# Patient Record
Sex: Male | Born: 1940
Health system: Southern US, Community
[De-identification: ages and names within clinical notes are randomized; demographics above are authoritative.]

## PROBLEM LIST (undated history)

## (undated) DIAGNOSIS — E790 Hyperuricemia without signs of inflammatory arthritis and tophaceous disease: Secondary | ICD-10-CM

## (undated) DIAGNOSIS — Z860100 Personal history of colon polyps, unspecified: Secondary | ICD-10-CM

## (undated) DIAGNOSIS — I499 Cardiac arrhythmia, unspecified: Secondary | ICD-10-CM

## (undated) DIAGNOSIS — I2510 Atherosclerotic heart disease of native coronary artery without angina pectoris: Secondary | ICD-10-CM

## (undated) DIAGNOSIS — C801 Malignant (primary) neoplasm, unspecified: Secondary | ICD-10-CM

## (undated) DIAGNOSIS — N400 Enlarged prostate without lower urinary tract symptoms: Secondary | ICD-10-CM

## (undated) DIAGNOSIS — M5430 Sciatica, unspecified side: Secondary | ICD-10-CM

## (undated) DIAGNOSIS — M4800 Spinal stenosis, site unspecified: Secondary | ICD-10-CM

## (undated) DIAGNOSIS — I82409 Acute embolism and thrombosis of unspecified deep veins of unspecified lower extremity: Secondary | ICD-10-CM

## (undated) DIAGNOSIS — I10 Essential (primary) hypertension: Secondary | ICD-10-CM

## (undated) DIAGNOSIS — R413 Other amnesia: Secondary | ICD-10-CM

## (undated) DIAGNOSIS — E785 Hyperlipidemia, unspecified: Secondary | ICD-10-CM

## (undated) DIAGNOSIS — F32A Depression, unspecified: Secondary | ICD-10-CM

## (undated) DIAGNOSIS — J302 Other seasonal allergic rhinitis: Secondary | ICD-10-CM

## (undated) DIAGNOSIS — F419 Anxiety disorder, unspecified: Secondary | ICD-10-CM

## (undated) DIAGNOSIS — K449 Diaphragmatic hernia without obstruction or gangrene: Secondary | ICD-10-CM

## (undated) DIAGNOSIS — K219 Gastro-esophageal reflux disease without esophagitis: Secondary | ICD-10-CM

## (undated) DIAGNOSIS — G4700 Insomnia, unspecified: Secondary | ICD-10-CM

## (undated) HISTORY — DX: Spinal stenosis, site unspecified: M48.00

## (undated) HISTORY — DX: Personal history of colonic polyps: Z86.010

## (undated) HISTORY — DX: Malignant (primary) neoplasm, unspecified: C80.1

## (undated) HISTORY — DX: Hyperlipidemia, unspecified: E78.5

## (undated) HISTORY — DX: Personal history of colon polyps, unspecified: Z86.0100

## (undated) HISTORY — DX: Sciatica, unspecified side: M54.30

## (undated) HISTORY — PX: VASECTOMY: SHX75

## (undated) HISTORY — DX: Diaphragmatic hernia without obstruction or gangrene: K44.9

## (undated) HISTORY — PX: OTHER SURGICAL HISTORY: SHX169

## (undated) HISTORY — DX: Hyperuricemia without signs of inflammatory arthritis and tophaceous disease: E79.0

---
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
## 1966-05-01 HISTORY — PX: INGUINAL HERNIA REPAIR: SHX194

## 1984-05-01 HISTORY — PX: HEMORRHOID SURGERY: SHX153

## 2000-01-09 ENCOUNTER — Encounter: Payer: Self-pay | Admitting: *Deleted

## 2000-01-09 ENCOUNTER — Encounter: Admission: RE | Admit: 2000-01-09 | Discharge: 2000-01-09 | Payer: Self-pay | Admitting: *Deleted

## 2003-03-05 ENCOUNTER — Encounter: Payer: Self-pay | Admitting: Internal Medicine

## 2004-03-09 ENCOUNTER — Ambulatory Visit: Payer: Self-pay | Admitting: Internal Medicine

## 2004-04-22 ENCOUNTER — Encounter: Admission: RE | Admit: 2004-04-22 | Discharge: 2004-04-22 | Payer: Self-pay | Admitting: Internal Medicine

## 2005-03-14 ENCOUNTER — Ambulatory Visit: Payer: Self-pay | Admitting: Internal Medicine

## 2005-06-27 ENCOUNTER — Ambulatory Visit: Payer: Self-pay | Admitting: Internal Medicine

## 2006-03-19 ENCOUNTER — Ambulatory Visit: Payer: Self-pay | Admitting: Internal Medicine

## 2006-05-01 HISTORY — PX: UPPER GASTROINTESTINAL ENDOSCOPY: SHX188

## 2006-06-26 ENCOUNTER — Ambulatory Visit (HOSPITAL_BASED_OUTPATIENT_CLINIC_OR_DEPARTMENT_OTHER): Admission: RE | Admit: 2006-06-26 | Discharge: 2006-06-26 | Payer: Self-pay | Admitting: Orthopaedic Surgery

## 2006-08-31 ENCOUNTER — Ambulatory Visit: Payer: Self-pay | Admitting: Internal Medicine

## 2007-04-02 ENCOUNTER — Telehealth (INDEPENDENT_AMBULATORY_CARE_PROVIDER_SITE_OTHER): Payer: Self-pay | Admitting: *Deleted

## 2007-04-26 ENCOUNTER — Ambulatory Visit: Payer: Self-pay | Admitting: Internal Medicine

## 2007-04-26 DIAGNOSIS — Z9089 Acquired absence of other organs: Secondary | ICD-10-CM | POA: Insufficient documentation

## 2007-04-26 DIAGNOSIS — I4510 Unspecified right bundle-branch block: Secondary | ICD-10-CM | POA: Insufficient documentation

## 2007-04-26 DIAGNOSIS — E785 Hyperlipidemia, unspecified: Secondary | ICD-10-CM | POA: Insufficient documentation

## 2007-05-03 ENCOUNTER — Encounter (INDEPENDENT_AMBULATORY_CARE_PROVIDER_SITE_OTHER): Payer: Self-pay | Admitting: *Deleted

## 2007-05-08 ENCOUNTER — Encounter (INDEPENDENT_AMBULATORY_CARE_PROVIDER_SITE_OTHER): Payer: Self-pay | Admitting: *Deleted

## 2007-07-18 ENCOUNTER — Emergency Department (HOSPITAL_COMMUNITY): Admission: EM | Admit: 2007-07-18 | Discharge: 2007-07-18 | Payer: Self-pay | Admitting: Emergency Medicine

## 2007-07-18 ENCOUNTER — Telehealth: Payer: Self-pay | Admitting: Internal Medicine

## 2007-07-19 ENCOUNTER — Ambulatory Visit: Payer: Self-pay | Admitting: Internal Medicine

## 2007-07-19 DIAGNOSIS — M48061 Spinal stenosis, lumbar region without neurogenic claudication: Secondary | ICD-10-CM | POA: Insufficient documentation

## 2007-10-10 ENCOUNTER — Telehealth (INDEPENDENT_AMBULATORY_CARE_PROVIDER_SITE_OTHER): Payer: Self-pay | Admitting: *Deleted

## 2007-10-21 ENCOUNTER — Ambulatory Visit: Payer: Self-pay | Admitting: Internal Medicine

## 2007-10-31 ENCOUNTER — Encounter (INDEPENDENT_AMBULATORY_CARE_PROVIDER_SITE_OTHER): Payer: Self-pay | Admitting: *Deleted

## 2007-10-31 LAB — CONVERTED CEMR LAB
ALT: 34 units/L (ref 0–53)
AST: 29 units/L (ref 0–37)
Albumin: 4.2 g/dL (ref 3.5–5.2)
Alkaline Phosphatase: 44 units/L (ref 39–117)
Bilirubin, Direct: 0.1 mg/dL (ref 0.0–0.3)
Cholesterol: 193 mg/dL (ref 0–200)
HDL: 36.2 mg/dL — ABNORMAL LOW (ref >39.0)
LDL Cholesterol: 127 mg/dL — ABNORMAL HIGH (ref 0–99)
Total Bilirubin: 1.3 mg/dL — ABNORMAL HIGH (ref 0.3–1.2)
Total CHOL/HDL Ratio: 5.3
Total Protein: 6.7 g/dL (ref 6.0–8.3)
Triglycerides: 148 mg/dL (ref 0–149)
VLDL: 30 mg/dL (ref 0–40)

## 2008-02-10 ENCOUNTER — Ambulatory Visit: Payer: Self-pay | Admitting: Internal Medicine

## 2008-05-29 ENCOUNTER — Encounter: Payer: Self-pay | Admitting: Internal Medicine

## 2008-06-24 ENCOUNTER — Ambulatory Visit: Payer: Self-pay | Admitting: Internal Medicine

## 2008-06-24 LAB — CONVERTED CEMR LAB
OCCULT 1: NEGATIVE
OCCULT 2: NEGATIVE
OCCULT 3: NEGATIVE

## 2008-06-25 ENCOUNTER — Encounter (INDEPENDENT_AMBULATORY_CARE_PROVIDER_SITE_OTHER): Payer: Self-pay | Admitting: *Deleted

## 2008-08-28 ENCOUNTER — Ambulatory Visit: Payer: Self-pay | Admitting: Internal Medicine

## 2008-08-28 DIAGNOSIS — M2550 Pain in unspecified joint: Secondary | ICD-10-CM | POA: Insufficient documentation

## 2008-08-28 DIAGNOSIS — N400 Enlarged prostate without lower urinary tract symptoms: Secondary | ICD-10-CM | POA: Insufficient documentation

## 2008-08-28 DIAGNOSIS — Z86010 Personal history of colonic polyps: Secondary | ICD-10-CM | POA: Insufficient documentation

## 2008-08-28 DIAGNOSIS — Z85828 Personal history of other malignant neoplasm of skin: Secondary | ICD-10-CM | POA: Insufficient documentation

## 2008-09-01 ENCOUNTER — Encounter (INDEPENDENT_AMBULATORY_CARE_PROVIDER_SITE_OTHER): Payer: Self-pay | Admitting: *Deleted

## 2009-02-16 ENCOUNTER — Encounter: Payer: Self-pay | Admitting: Internal Medicine

## 2009-04-22 ENCOUNTER — Ambulatory Visit: Payer: Self-pay | Admitting: Internal Medicine

## 2009-09-03 ENCOUNTER — Ambulatory Visit: Payer: Self-pay | Admitting: Internal Medicine

## 2009-09-03 DIAGNOSIS — M2550 Pain in unspecified joint: Secondary | ICD-10-CM | POA: Insufficient documentation

## 2009-09-03 DIAGNOSIS — R9431 Abnormal electrocardiogram [ECG] [EKG]: Secondary | ICD-10-CM | POA: Insufficient documentation

## 2009-11-02 ENCOUNTER — Encounter: Payer: Self-pay | Admitting: Internal Medicine

## 2009-11-03 ENCOUNTER — Encounter: Payer: Self-pay | Admitting: Internal Medicine

## 2009-11-23 ENCOUNTER — Ambulatory Visit: Payer: Self-pay | Admitting: Internal Medicine

## 2009-11-23 LAB — CONVERTED CEMR LAB
OCCULT 1: NEGATIVE
OCCULT 2: NEGATIVE
OCCULT 3: NEGATIVE

## 2009-11-24 ENCOUNTER — Encounter (INDEPENDENT_AMBULATORY_CARE_PROVIDER_SITE_OTHER): Payer: Self-pay | Admitting: *Deleted

## 2009-12-07 ENCOUNTER — Telehealth (INDEPENDENT_AMBULATORY_CARE_PROVIDER_SITE_OTHER): Payer: Self-pay | Admitting: *Deleted

## 2010-01-27 ENCOUNTER — Encounter: Payer: Self-pay | Admitting: Internal Medicine

## 2010-02-13 ENCOUNTER — Encounter: Payer: Self-pay | Admitting: Internal Medicine

## 2010-03-14 ENCOUNTER — Encounter: Payer: Self-pay | Admitting: Internal Medicine

## 2010-03-15 ENCOUNTER — Telehealth (INDEPENDENT_AMBULATORY_CARE_PROVIDER_SITE_OTHER): Payer: Self-pay | Admitting: *Deleted

## 2010-03-22 ENCOUNTER — Ambulatory Visit: Payer: Self-pay | Admitting: Internal Medicine

## 2010-03-25 LAB — CONVERTED CEMR LAB
ALT: 28 units/L (ref 0–53)
AST: 29 units/L (ref 0–37)
Albumin: 4.3 g/dL (ref 3.5–5.2)
Alkaline Phosphatase: 45 units/L (ref 39–117)
BUN: 19 mg/dL (ref 6–23)
Basophils Absolute: 0 10*3/uL (ref 0.0–0.1)
Basophils Relative: 0.5 % (ref 0.0–3.0)
Bilirubin, Direct: 0.1 mg/dL (ref 0.0–0.3)
CO2: 29 meq/L (ref 19–32)
Calcium: 9.2 mg/dL (ref 8.4–10.5)
Chloride: 105 meq/L (ref 96–112)
Creatinine, Ser: 1.1 mg/dL (ref 0.4–1.5)
Eosinophils Absolute: 0.1 10*3/uL (ref 0.0–0.7)
Eosinophils Relative: 1.5 % (ref 0.0–5.0)
GFR calc non Af Amer: 70.04 mL/min (ref >60)
Glucose, Bld: 100 mg/dL — ABNORMAL HIGH (ref 70–99)
HCT: 42.7 % (ref 39.0–52.0)
Hemoglobin: 14.6 g/dL (ref 13.0–17.0)
Lymphocytes Relative: 13.7 % (ref 12.0–46.0)
Lymphs Abs: 1.1 10*3/uL (ref 0.7–4.0)
MCHC: 34.2 g/dL (ref 30.0–36.0)
MCV: 98.3 fL (ref 78.0–100.0)
Monocytes Absolute: 0.4 10*3/uL (ref 0.1–1.0)
Monocytes Relative: 4.9 % (ref 3.0–12.0)
Neutro Abs: 6.3 10*3/uL (ref 1.4–7.7)
Neutrophils Relative %: 79.4 % — ABNORMAL HIGH (ref 43.0–77.0)
Platelets: 169 10*3/uL (ref 150.0–400.0)
Potassium: 4.8 meq/L (ref 3.5–5.1)
RBC: 4.34 M/uL (ref 4.22–5.81)
RDW: 13.5 % (ref 11.5–14.6)
Sed Rate: 9 mm/hr (ref 0–22)
Sodium: 143 meq/L (ref 135–145)
TSH: 1.43 microintl units/mL (ref 0.35–5.50)
Total Bilirubin: 0.8 mg/dL (ref 0.3–1.2)
Total Protein: 6.6 g/dL (ref 6.0–8.3)
WBC: 7.9 10*3/uL (ref 4.5–10.5)

## 2010-05-01 DIAGNOSIS — E790 Hyperuricemia without signs of inflammatory arthritis and tophaceous disease: Secondary | ICD-10-CM

## 2010-05-01 HISTORY — DX: Hyperuricemia without signs of inflammatory arthritis and tophaceous disease: E79.0

## 2010-05-29 LAB — CONVERTED CEMR LAB
ALT: 26 units/L (ref 0–53)
ALT: 27 units/L (ref 0–40)
ALT: 28 units/L (ref 0–53)
ALT: 29 units/L (ref 0–53)
AST: 25 units/L (ref 0–37)
AST: 25 units/L (ref 0–37)
AST: 27 units/L (ref 0–37)
AST: 27 units/L (ref 0–37)
Albumin: 4.1 g/dL (ref 3.5–5.2)
Albumin: 4.1 g/dL (ref 3.5–5.2)
Albumin: 4.2 g/dL (ref 3.5–5.2)
Albumin: 4.4 g/dL (ref 3.5–5.2)
Alkaline Phosphatase: 43 units/L (ref 39–117)
Alkaline Phosphatase: 47 units/L (ref 39–117)
Alkaline Phosphatase: 48 units/L (ref 39–117)
Alkaline Phosphatase: 48 units/L (ref 39–117)
BUN: 12 mg/dL (ref 6–23)
BUN: 12 mg/dL (ref 6–23)
BUN: 16 mg/dL (ref 6–23)
BUN: 18 mg/dL (ref 6–23)
Basophils Absolute: 0 10*3/uL (ref 0.0–0.1)
Basophils Absolute: 0 10*3/uL (ref 0.0–0.1)
Basophils Absolute: 0 10*3/uL (ref 0.0–0.1)
Basophils Absolute: 0 10*3/uL (ref 0.0–0.1)
Basophils Relative: 0.1 % (ref 0.0–1.0)
Basophils Relative: 0.1 % (ref 0.0–1.0)
Basophils Relative: 0.4 % (ref 0.0–3.0)
Basophils Relative: 0.5 % (ref 0.0–3.0)
Bilirubin Urine: NEGATIVE
Bilirubin, Direct: 0.1 mg/dL (ref 0.0–0.3)
Bilirubin, Direct: 0.2 mg/dL (ref 0.0–0.3)
Bilirubin, Direct: 0.2 mg/dL (ref 0.0–0.3)
Blood in Urine, dipstick: NEGATIVE
CO2: 29 meq/L (ref 19–32)
CO2: 30 meq/L (ref 19–32)
CO2: 30 meq/L (ref 19–32)
CO2: 32 meq/L (ref 19–32)
Calcium: 9.1 mg/dL (ref 8.4–10.5)
Calcium: 9.2 mg/dL (ref 8.4–10.5)
Calcium: 9.2 mg/dL (ref 8.4–10.5)
Calcium: 9.6 mg/dL (ref 8.4–10.5)
Chloride: 103 meq/L (ref 96–112)
Chloride: 104 meq/L (ref 96–112)
Chloride: 105 meq/L (ref 96–112)
Chloride: 109 meq/L (ref 96–112)
Chol/HDL Ratio, serum: 4.2
Cholesterol, target level: 200 mg/dL
Cholesterol, target level: 200 mg/dL
Cholesterol: 166 mg/dL (ref 0–200)
Cholesterol: 166 mg/dL (ref 0–200)
Cholesterol: 169 mg/dL (ref 0–200)
Cholesterol: 173 mg/dL (ref 0–200)
Creatinine, Ser: 0.9 mg/dL (ref 0.4–1.5)
Creatinine, Ser: 0.9 mg/dL (ref 0.4–1.5)
Creatinine, Ser: 1 mg/dL (ref 0.4–1.5)
Creatinine, Ser: 1 mg/dL (ref 0.4–1.5)
Eosinophil percent: 2.6 % (ref 0.0–5.0)
Eosinophils Absolute: 0.2 10*3/uL (ref 0.0–0.6)
Eosinophils Absolute: 0.2 10*3/uL (ref 0.0–0.7)
Eosinophils Absolute: 0.4 10*3/uL (ref 0.0–0.7)
Eosinophils Relative: 3 % (ref 0.0–5.0)
Eosinophils Relative: 3.8 % (ref 0.0–5.0)
Eosinophils Relative: 5.2 % — ABNORMAL HIGH (ref 0.0–5.0)
GFR calc Af Amer: 109 mL/min
GFR calc non Af Amer: 80 mL/min
GFR calc non Af Amer: 81.97 mL/min (ref >60)
GFR calc non Af Amer: 89.65 mL/min (ref >60)
GFR calc non Af Amer: 90 mL/min
Glomerular Filtration Rate, Af Am: 97 mL/min/{1.73_m2}
Glucose, Bld: 103 mg/dL — ABNORMAL HIGH (ref 70–99)
Glucose, Bld: 114 mg/dL — ABNORMAL HIGH (ref 70–99)
Glucose, Bld: 93 mg/dL (ref 70–99)
Glucose, Bld: 93 mg/dL (ref 70–99)
Glucose, Urine, Semiquant: NEGATIVE
HCT: 42.4 % (ref 39.0–52.0)
HCT: 42.8 % (ref 39.0–52.0)
HCT: 43 % (ref 39.0–52.0)
HCT: 43.5 % (ref 39.0–52.0)
HDL goal, serum: 40 mg/dL
HDL goal, serum: 40 mg/dL
HDL: 34.1 mg/dL — ABNORMAL LOW (ref >39.0)
HDL: 37 mg/dL — ABNORMAL LOW (ref >39.00)
HDL: 40.1 mg/dL (ref >39.0)
HDL: 44.8 mg/dL (ref >39.00)
Hemoglobin: 14.6 g/dL (ref 13.0–17.0)
Hemoglobin: 14.7 g/dL (ref 13.0–17.0)
Hemoglobin: 14.7 g/dL (ref 13.0–17.0)
Hemoglobin: 14.8 g/dL (ref 13.0–17.0)
Hgb A1c MFr Bld: 5.4 % (ref 4.6–6.5)
Ketones, urine, test strip: NEGATIVE
LDL Cholesterol: 100 mg/dL — ABNORMAL HIGH (ref 0–99)
LDL Cholesterol: 109 mg/dL — ABNORMAL HIGH (ref 0–99)
LDL Cholesterol: 111 mg/dL — ABNORMAL HIGH (ref 0–99)
LDL Cholesterol: 92 mg/dL (ref 0–99)
LDL Goal: 100 mg/dL
LDL Goal: 160 mg/dL
LDL Goal: 90 mg/dL
Lymphocytes Relative: 18.3 % (ref 12.0–46.0)
Lymphocytes Relative: 18.6 % (ref 12.0–46.0)
Lymphocytes Relative: 20.2 % (ref 12.0–46.0)
Lymphocytes Relative: 23.1 % (ref 12.0–46.0)
Lymphs Abs: 1.2 10*3/uL (ref 0.7–4.0)
Lymphs Abs: 1.3 10*3/uL (ref 0.7–4.0)
MCHC: 33.6 g/dL (ref 30.0–36.0)
MCHC: 34.3 g/dL (ref 30.0–36.0)
MCHC: 34.5 g/dL (ref 30.0–36.0)
MCHC: 34.7 g/dL (ref 30.0–36.0)
MCV: 96.1 fL (ref 78.0–100.0)
MCV: 97.1 fL (ref 78.0–100.0)
MCV: 97.4 fL (ref 78.0–100.0)
MCV: 97.8 fL (ref 78.0–100.0)
Monocytes Absolute: 0.3 10*3/uL (ref 0.1–1.0)
Monocytes Absolute: 0.3 10*3/uL (ref 0.2–0.7)
Monocytes Absolute: 0.3 10*3/uL (ref 0.2–0.7)
Monocytes Absolute: 0.4 10*3/uL (ref 0.1–1.0)
Monocytes Relative: 5 % (ref 3.0–12.0)
Monocytes Relative: 5.1 % (ref 3.0–11.0)
Monocytes Relative: 5.8 % (ref 3.0–12.0)
Monocytes Relative: 6.1 % (ref 3.0–11.0)
Neutro Abs: 3.5 10*3/uL (ref 1.4–7.7)
Neutro Abs: 4.1 10*3/uL (ref 1.4–7.7)
Neutro Abs: 4.8 10*3/uL (ref 1.4–7.7)
Neutro Abs: 5 10*3/uL (ref 1.4–7.7)
Neutrophils Relative %: 66.8 % (ref 43.0–77.0)
Neutrophils Relative %: 70.8 % (ref 43.0–77.0)
Neutrophils Relative %: 71 % (ref 43.0–77.0)
Neutrophils Relative %: 73.5 % (ref 43.0–77.0)
Nitrite: NEGATIVE
PSA: 0.89 ng/mL (ref 0.10–4.00)
PSA: 1.08 ng/mL (ref 0.10–4.00)
PSA: 1.09 ng/mL (ref 0.10–4.00)
PSA: 1.18 ng/mL (ref 0.10–4.00)
Platelets: 146 10*3/uL — ABNORMAL LOW (ref 150.0–400.0)
Platelets: 158 10*3/uL (ref 150–400)
Platelets: 161 10*3/uL (ref 150–400)
Platelets: 172 10*3/uL (ref 150.0–400.0)
Potassium: 4.3 meq/L (ref 3.5–5.1)
Potassium: 4.4 meq/L (ref 3.5–5.1)
Potassium: 4.6 meq/L (ref 3.5–5.1)
Potassium: 4.9 meq/L (ref 3.5–5.1)
Protein, U semiquant: NEGATIVE
RBC: 4.37 M/uL (ref 4.22–5.81)
RBC: 4.42 M/uL (ref 4.22–5.81)
RBC: 4.44 M/uL (ref 4.22–5.81)
RBC: 4.45 M/uL (ref 4.22–5.81)
RDW: 11.9 % (ref 11.5–14.6)
RDW: 12 % (ref 11.5–14.6)
RDW: 12 % (ref 11.5–14.6)
RDW: 13.1 % (ref 11.5–14.6)
Rheumatoid fact SerPl-aCnc: 20 intl units/mL (ref 0.0–20.0)
Sed Rate: 8 mm/hr (ref 0–22)
Sodium: 141 meq/L (ref 135–145)
Sodium: 142 meq/L (ref 135–145)
Sodium: 142 meq/L (ref 135–145)
Sodium: 145 meq/L (ref 135–145)
Specific Gravity, Urine: >=1.03
TSH: 1.08 microintl units/mL (ref 0.35–5.50)
TSH: 1.14 microintl units/mL (ref 0.35–5.50)
TSH: 1.15 microintl units/mL (ref 0.35–5.50)
TSH: 1.77 microintl units/mL (ref 0.35–5.50)
Total Bilirubin: 0.8 mg/dL (ref 0.3–1.2)
Total Bilirubin: 1.3 mg/dL — ABNORMAL HIGH (ref 0.3–1.2)
Total Bilirubin: 1.4 mg/dL — ABNORMAL HIGH (ref 0.3–1.2)
Total Bilirubin: 1.5 mg/dL — ABNORMAL HIGH (ref 0.3–1.2)
Total CHOL/HDL Ratio: 4
Total CHOL/HDL Ratio: 4.9
Total CHOL/HDL Ratio: 5
Total Protein: 6.4 g/dL (ref 6.0–8.3)
Total Protein: 6.9 g/dL (ref 6.0–8.3)
Total Protein: 7 g/dL (ref 6.0–8.3)
Total Protein: 7.1 g/dL (ref 6.0–8.3)
Triglyceride fasting, serum: 98 mg/dL (ref 0–149)
Triglycerides: 106 mg/dL (ref 0.0–149.0)
Triglycerides: 124 mg/dL (ref 0.0–149.0)
Triglycerides: 197 mg/dL — ABNORMAL HIGH (ref 0–149)
Uric Acid, Serum: 7.1 mg/dL (ref 4.0–7.8)
Urobilinogen, UA: NEGATIVE
VLDL: 20 mg/dL (ref 0–40)
VLDL: 21.2 mg/dL (ref 0.0–40.0)
VLDL: 24.8 mg/dL (ref 0.0–40.0)
VLDL: 39 mg/dL (ref 0–40)
Vit D, 25-Hydroxy: 44 ng/mL (ref 30–89)
WBC Urine, dipstick: NEGATIVE
WBC: 5.2 10*3/uL (ref 4.5–10.5)
WBC: 5.7 10*3/uL (ref 4.5–10.5)
WBC: 6.5 10*3/uL (ref 4.5–10.5)
WBC: 7.1 10*3/uL (ref 4.5–10.5)
pH: 5

## 2010-06-02 NOTE — Letter (Signed)
Summary: E Mail from Patient with Concerns  E Mail from Patient with Concerns   Imported By: Lanelle Bal 02/19/2010 09:03:32  _____________________________________________________________________  External Attachment:    Type:   Image     Comment:   External Document

## 2010-06-02 NOTE — Letter (Signed)
Summary: Results Follow up Letter  [place] [facility]  [address] [place], [place] [address]   Phone: [phone]  Fax: [phone]    06/25/2008 MRN: [id number]  James Noble [address] [place], [place]  [address]  Dear James Noble,  The following are the results of your recent test(s):  Test         Result    Pap Smear:        Normal _____  Not Normal _____ Comments: ______________________________________________________ Cholesterol: LDL(Bad cholesterol):         Your goal is less than:         HDL (Good cholesterol):       Your goal is more than: Comments:  ______________________________________________________ Mammogram:        Normal _____  Not Normal _____ Comments:  ___________________________________________________________________ Hemoccult:        Normal _X____  Not normal _______ Comments:    _____________________________________________________________________ Other Tests:    We routinely do not discuss normal results over the telephone.  If you desire a copy of the results, or you have any questions about this information we can discuss them at your next office visit.   Sincerely,

## 2010-06-02 NOTE — Letter (Signed)
Summary: Primary Care Appointment Letter  [place] [facility]  [address] [place], [place] [address]   Phone: [phone]  Fax: [phone]    05/08/2007 MRN: [id number]  James Noble [address] [place], [place]  [address]  Dear James Noble,   Your Primary Care Physician  has indicated that:    _______[place] call our office & schedule a lab appointment 10-11 weeks of diet changes & starting crestor.      _______you missed your appointment on______ and need to call and          reschedule.    _______you need to have lab work done.    _______you need to schedule an appointment discuss lab or test results.    _______you need to call to reschedule your appointment that is                       scheduled on _________.     Please call our office as soon as possible. Our phone number is [phone]. Please press option 1. Our office is open 8a-12noon and 1p-5p, Monday through Friday.    Thank you,    [facility] Scheduler

## 2010-06-02 NOTE — Progress Notes (Signed)
Summary: HOPPER-VYTORIN 10-20 MG #30  Phone Note Refill Request   Refills Requested: Medication #1:  VYTORIN 10-20 MG  TABS.  Varney Baas [id number]  Initial call taken by: Freddy Jaksch,  April 02, 2007 2:32 PM  Follow-up for Phone Call        Rx called to pharmacy Follow-up by: Wendall Stade,  April 02, 2007 3:31 PM      Prescriptions: VYTORIN 10-20 MG  TABS (EZETIMIBE-SIMVASTATIN)   #30 x 1   Entered by:   Wendall Stade   Authorized by:   Marga Melnick MD   Signed by:   Wendall Stade on 04/02/2007   Method used:   Telephoned to ...       [facility]       [address]       [place], [place]  [address]  [place]       Ph: [phone] or [phone]       Fax: [phone]   RxID:   [id number]

## 2010-06-02 NOTE — Letter (Signed)
Summary: Results Follow up Letter  [place] [facility]  [address] [place], [place] [address]   Phone: [phone]  Fax: [phone]    11/24/2009 MRN: [id number]  GRANVIL DJORDJEVIC [address] [place], [place]  [address]  Dear Mr. Zuelke,  The following are the results of your recent test(s):  Test         Result    Pap Smear:        Normal _____  Not Normal _____ Comments: ______________________________________________________ Cholesterol: LDL(Bad cholesterol):         Your goal is less than:         HDL (Good cholesterol):       Your goal is more than: Comments:  ______________________________________________________ Mammogram:        Normal _____  Not Normal _____ Comments:  ___________________________________________________________________ Hemoccult:        Normal _X___  Not normal _______ Comments:    _____________________________________________________________________ Other Tests:    We routinely do not discuss normal results over the telephone.  If you desire a copy of the results, or you have any questions about this information we can discuss them at your next office visit.   Sincerely,

## 2010-06-02 NOTE — Letter (Signed)
Summary: Results Follow up Letter  [place] at [facility]  [address] [place], [place] [address]   Phone: [phone]  Fax: [phone]    09/01/2008 MRN: [id number]  HOY FALLERT [address] [place], [place]  [address]  Dear Mr. Pandey,  The following are the results of your recent test(s):  Test         Result    Pap Smear:        Normal _____  Not Normal _____ Comments: ______________________________________________________ Cholesterol: LDL(Bad cholesterol):         Your goal is less than:         HDL (Good cholesterol):       Your goal is more than: Comments:  ______________________________________________________ Mammogram:        Normal _____  Not Normal _____ Comments:  ___________________________________________________________________ Hemoccult:        Normal _____  Not normal _______ Comments:    _____________________________________________________________________ Other Tests: PLEASE SEE ATTACHED LABS DONE ON 08/28/2008    We routinely do not discuss normal results over the telephone.  If you desire a copy of the results, or you have any questions about this information we can discuss them at your next office visit.   Sincerely,

## 2010-06-02 NOTE — Letter (Signed)
Summary: Results Follow up Letter  [place] [facility]  [address] [place], [place] [address]   Phone: [phone]  Fax: [phone]    10/31/2007 MRN: [id number]  PEREZ DIRICO [address] [place], [place]  [address]  Dear Mr. Cleland,  The following are the results of your recent test(s):  Test         Result    Pap Smear:        Normal _____  Not Normal _____ Comments: ______________________________________________________ Cholesterol: LDL(Bad cholesterol):         Your goal is less than:         HDL (Good cholesterol):       Your goal is more than: Comments:  ______________________________________________________ Mammogram:        Normal _____  Not Normal _____ Comments:  ___________________________________________________________________ Hemoccult:        Normal _____  Not normal _______ Comments:    _____________________________________________________________________ Other Tests:    We routinely do not discuss normal results over the telephone.  If you desire a copy of the results, or you have any questions about this information we can discuss them at your next office visit.   Sincerely,

## 2010-06-02 NOTE — Letter (Signed)
Summary: Results Follow up Letter  [place] at [facility]  [address] [place], [place] [address]   Phone: [phone]  Fax: [phone]    05/03/2007 MRN: [id number]  James Noble [address] [place], [place]  [address]  Dear [name],  The following are the results of your recent test(s):  Test         Result    Pap Smear:        Normal _____  Not Normal _____ Comments: ______________________________________________________ Cholesterol: LDL(Bad cholesterol):         Your goal is less than:         HDL (Good cholesterol):       Your goal is more than: Comments:  ______________________________________________________ Mammogram:        Normal _____  Not Normal _____ Comments:  ___________________________________________________________________ Hemoccult:        Normal _____  Not normal _______ Comments:    _____________________________________________________________________ Other Tests:  Please see attached results and comments   We routinely do not discuss normal results over the telephone.  If you desire a copy of the results, or you have any questions about this information we can discuss them at your next office visit.   Sincerely,

## 2010-06-02 NOTE — Progress Notes (Signed)
Summary: Request for Shingles Vaccine  Phone Note Call from Patient Call back at Home Phone [phone]   Caller: Patient Summary of Call: Patient sent Dr.Hopper an email stating that he would like a rx for shingles vaccine sent to [facility] due to his wife James Noble) being diagnosed with shingles.   I called patient to clarify which [facility] location and [facility] wife stated [facility] [address] CMA  December 07, 2009 8:12 AM     New/Updated Medications: ZOSTAVAX 09811 UNT/0.65ML SOLR (ZOSTER VACCINE LIVE) 1 Subcutaneously Injection x 1 Prescriptions: ZOSTAVAX 91478 UNT/0.65ML SOLR (ZOSTER VACCINE LIVE) 1 Subcutaneously Injection x 1  #1 x 0   Entered by:   Shonna Chock CMA   Authorized by:   Marga Melnick MD   Signed by:   Shonna Chock CMA on 12/07/2009   Method used:   Electronically to        [facility] [address] [phone]* (retail)       [address]       [address]       [place], [place]  [address]       Ph: [phone]       Fax: [phone]   RxID:   [id number]

## 2010-06-02 NOTE — Assessment & Plan Note (Signed)
Summary: CPX/KDC   Vital Signs:  Patient profile:   70 year old male Height:      71.75 inches Weight:      186 pounds BMI:     25.49 Temp:     98.9 degrees F oral Pulse rate:   64 / minute Resp:     14 per minute BP sitting:   118 / 74  (left arm) Cuff size:   large  Vitals Entered By: Shonna Chock (Sep 03, 2009 11:00 AM)  Comments REVIEWED MED LIST, PATIENT AGREED DOSE AND INSTRUCTION CORRECT    History of Present Illness: Dr. Vanice Sarah is here for a physical ; he is asymptomatic except for arthritic hand symptoms responsive to NSAIDS ( Aleve once daily ).  Lipid Management History:      Positive NCEP/ATP III risk factors include male age 13 years old or older and HDL cholesterol less than 40.  Negative NCEP/ATP III risk factors include non-diabetic, no family history for ischemic heart disease, non-tobacco-user status, non-hypertensive, no ASHD (atherosclerotic heart disease), no prior stroke/TIA, no peripheral vascular disease, and no history of aortic aneurysm.     Allergies (verified): No Known Drug Allergies  Past History:  Past Medical History: Hyperlipidemia, LDL goal = < 100 based on NMR Lipoprofile; Gilbert's Syndrome  Spinal stenosis  of LS spine, Dr Sandria Manly Colonic polyps, hx of, Dr Ritta Slot Skin cancer, hx of, Dr Dorinda Hill  Past Surgical History: Hemorrhoidectomy 1986 Inguinal herniorrhaphy  1968 Vasectomy Colon polypectomy 2005, Dr Ritta Slot, due 2014 Tonsillectomy Arthroscopy   R shoulder, Dr Cleophas Dunker   Family History: Father: MI @ 28, RHD(no Rheumatoid Arthritis) Mother: CVA @ 58, Osteoarthritis Siblings: brother:  Down's Syndrome, pacer for tachyarrhythmia MGM :CVA in 74s; MGF: CVA in 11s  Social History: Heart Healthy diet Occupation: Orthodontist Married Former Smoker: quit 1970 Alcohol use-yes: socially Regular exercise-yes: 4-6X/week CVE w/o symptoms  Review of Systems  The patient denies anorexia, fever, vision loss, decreased  hearing, hoarseness, chest pain, syncope, dyspnea on exertion, peripheral edema, prolonged cough, headaches, hemoptysis, abdominal pain, melena, hematochezia, severe indigestion/heartburn, hematuria, suspicious skin lesions, depression, unusual weight change, abnormal bleeding, enlarged lymph nodes, and angioedema.         Weight loss 5# with diet & exercise. MS:  Complains of joint pain; denies joint redness, joint swelling, mid back pain, and thoracic pain; Intermittent LBP .  Physical Exam  General:  well-nourished; alert,appropriate and cooperative throughout examination Head:  Normocephalic and atraumatic without obvious abnormalities. No apparent alopecia  Eyes:  No corneal or conjunctival inflammation noted.  Perrla. Funduscopic exam benign, without hemorrhages, exudates or papilledema.  Ears:  External ear exam shows no significant lesions or deformities.  Otoscopic examination reveals clear canals, tympanic membranes are intact bilaterally without bulging, retraction, inflammation or discharge. Hearing is grossly normal bilaterally. TMs slightly dull Nose:  External nasal examination shows no deformity or inflammation. Nasal mucosa are pink and moist without lesions or exudates. Mouth:  Oral mucosa and oropharynx without lesions or exudates.  Teeth in excellent  repair. Neck:  No deformities, masses, or tenderness noted.? cervical rib on rib Lungs:  Normal respiratory effort, chest expands symmetrically. Lungs are clear to auscultation, no crackles or wheezes. Heart:  Normal rate and regular rhythm. S1 and S2 normal without gallop, murmur,  rub . S4 vs click @ apex Abdomen:  Bowel sounds positive,abdomen soft and non-tender without masses, organomegaly or hernias noted. Rectal:  No external abnormalities noted. Normal sphincter  tone. No rectal masses or tenderness. Genitalia:  Testes bilaterally descended without nodularity, tenderness or masses. No scrotal masses or lesions. No penis  lesions or urethral discharge. Prostate:  Prostate gland firm and smooth, ULN enlargement,w/o  nodularity, tenderness, mass, asymmetry or induration. Msk:  No deformity or scoliosis noted of thoracic or lumbar spine.   Pulses:  R and L carotid,radial,dorsalis pedis and posterior tibial pulses are full and equal bilaterally Extremities:  No clubbing, cyanosis, edema, or deformity noted with normal full range of motion of all joints.   Neurologic:  alert & oriented X3, strength normal in all extremities, and DTRs symmetrical and normal.   Skin:  Intact without suspicious lesions or rashes Cervical Nodes:  No lymphadenopathy noted Axillary Nodes:  No palpable lymphadenopathy Inguinal Nodes:  No significant adenopathy Psych:  memory intact for recent and remote, normally interactive, and good eye contact.     Impression & Recommendations:  Problem # 1:  ROUTINE GENERAL MEDICAL EXAM@HEALTH  CARE FACL (ICD-V70.0)  Orders: EKG w/ Interpretation (93000) Venipuncture (14782) TLB-Lipid Panel (80061-LIPID) TLB-BMP (Basic Metabolic Panel-BMET) (80048-METABOL) TLB-CBC Platelet - w/Differential (85025-CBCD) TLB-Hepatic/Liver Function Pnl (80076-HEPATIC) TLB-TSH (Thyroid Stimulating Hormone) (84443-TSH) TLB-PSA (Prostate Specific Antigen) (84153-PSA)  Problem # 2:  HYPERLIPIDEMIA (ICD-272.4)  His updated medication list for this problem includes:    Vytorin 10-20 Mg Tabs (Ezetimibe-simvastatin) .Marland Kitchen... 1 qhs  Orders: EKG w/ Interpretation (93000) Venipuncture (95621) TLB-Lipid Panel (80061-LIPID)  Problem # 3:  NONSPECIFIC ABNORMAL ELECTROCARDIOGRAM (ICD-794.31)  Slight progression of NS  T changes in  V leads vs. 2010 EKG . Asymptomatic @ very high levels of aerobic exercise  Orders: EKG w/ Interpretation (93000) Venipuncture (30865) Cardiology Referral (Cardiology)  Problem # 4:  ARTHRALGIA (ICD-719.40)  Orders: Venipuncture (78469)  Complete Medication List: 1)  Vytorin 10-20 Mg  Tabs (Ezetimibe-simvastatin) .Marland Kitchen.. 1 qhs 2)  Neurontin 300 Mg Caps (Gabapentin) .Marland Kitchen.. 1 by mouth once daily 3)  Tramadol Hcl 50 Mg Tabs (Tramadol hcl) .Marland Kitchen.. 1 every 6 hrs as needed for joint pain  Lipid Assessment/Plan:      Based on NCEP/ATP III, the patient's risk factor category is "2 or more risk factors and a calculated 10 year CAD risk of < 20%".  The patient's lipid goals are as follows: Total cholesterol goal is 200; LDL cholesterol goal is 100; HDL cholesterol goal is 40; Triglyceride goal is 150.  His LDL cholesterol goal has been met.  Secondary causes for hyperlipidemia have been ruled out.  He has been counseled on adjunctive measures for lowering his cholesterol and has been provided with dietary instructions.    Patient Instructions: 1)  Share corrected data with all MDs seen. Prescriptions: TRAMADOL HCL 50 MG TABS (TRAMADOL HCL) 1 every 6 hrs as needed for joint pain  #30 x 5   Entered and Authorized by:   Marga Melnick MD   Signed by:   Marga Melnick MD on 09/03/2009   Method used:   Print then Give to Patient   RxID:   [id number]     Laboratory Results   Urine Tests   Date/Time Reported: Sep 03, 2009 12:56 PM   Routine Urinalysis   Color: yellow Appearance: Clear Glucose: negative   (Normal Range: Negative) Bilirubin: negative   (Normal Range: Negative) Ketone: negative   (Normal Range: Negative) Spec. Gravity: >=1.030   (Normal Range: 1.003-1.035) Blood: negative   (Normal Range: Negative) pH: 5.0   (Normal Range: 5.0-8.0) Protein: negative   (Normal Range: Negative) Urobilinogen: negative   (  Normal Range: 0-1) Nitrite: negative   (Normal Range: Negative) Leukocyte Esterace: negative   (Normal Range: Negative)    Comments: Floydene Flock  Sep 03, 2009 12:57 PM

## 2010-06-02 NOTE — Assessment & Plan Note (Signed)
Summary: Follow-up   Vital Signs:  Patient profile:   70 year old male Weight:      187 pounds BMI:     25.63 Pulse rate:   72 / minute Resp:     14 per minute BP sitting:   126 / 78  (left arm) Cuff size:   large  Vitals Entered By: Shonna Chock CMA (March 22, 2010 8:08 AM) CC: Patient told to f/u per MD , Diarrhea   CC:  Patient told to f/u per MD  and Diarrhea.  History of Present Illness: Diarrhea      This is a 70 year old man who presents with Diarrhea intermittently since since early 12/2009. He has been  symptom free up to 2 weeks @ a time.  The patient reports 3 stools or less per day ( usually 2), watery/unformed stools,  occasionally  voluminous stools, malodorous stools,usually in   early am ( nocturnal diarrhea only with 1st episode), some  bloating &  gassiness, and abrupt onset of symptoms. He  denies blood in stool, mucus in stool, greasy stools, fecal urgency, fecal soiling, alternating diarrhea/constipation, and fasting diarrhea.  Associated symptoms include weight loss due to dietary restrictions , eliminating potential triggers.  The patient denies fever, abdominal pain, abdominal cramps, nausea, vomiting, lightheadedness, increased thirst, joint pains, mouth ulcers, and eye redness.  The symptoms are worse with specific foods, ? eggs.  The symptoms are better with probiotics, Align.  Patient has no PMH  risk factors for diarrhea;he had polypectomy in 2005.No suspect water or food exposures; no recent antibiotics or travel.There  is no FH of GI disease.  Current Medications (verified): 1)  Vytorin 10-20 Mg  Tabs (Ezetimibe-Simvastatin) .Marland Kitchen.. 1 Qhs 2)  Neurontin 300 Mg Caps (Gabapentin) .Marland Kitchen.. 1 By Mouth Once Daily  Allergies (verified): No Known Drug Allergies  Past History:  Past Medical History: Hyperlipidemia, LDL goal = < 100 based on NMR Lipoprofile; Gilbert's Syndrome  Spinal stenosis  of LS spine, Dr Avie Echevaria Colonic polyps, hx of, Dr Ritta Slot Skin  cancer, hx of, Dr Dorinda Hill  Past Surgical History: Hemorrhoidectomy 1986 Inguinal herniorrhaphy  1968 Vasectomy Colon polypectomy 2005; negative 2009,Dr Jeff  Medoff, due 2014 Tonsillectomy Arthroscopy   R shoulder, Dr Cleophas Dunker   Review of Systems General:  Denies chills, fatigue, and sweats. Eyes:  Denies blurring, double vision, and vision loss-both eyes. ENT:  Denies difficulty swallowing and hoarseness. CV:  Occasional palpitations; neg Holter by Dr Deborah Chalk. Derm:  Denies changes in nail beds, dryness, and hair loss. Neuro:  Denies numbness and tingling. Endo:  Denies cold intolerance and heat intolerance.  Physical Exam  General:  well-nourished,in no acute distress; alert,appropriate and cooperative throughout examination Eyes:  No corneal or conjunctival inflammation noted. No lid lag; no icterus. Mouth:  Oral mucosa and oropharynx without lesions or exudates.  Teeth in good repair. No pharyngeal erythema.   Neck:  No deformities, masses, or tenderness noted. Lungs:  Normal respiratory effort, chest expands symmetrically. Lungs are clear to auscultation, no crackles or wheezes. Heart:  Normal rate and regular rhythm. S1 and S2 normal without gallop, murmur, click, rub or other extra sounds. Abdomen:  Bowel sounds positive,abdomen soft and non-tender without masses, organomegaly or hernias noted. Extremities:  No clubbing, cyanosis, edema. No onycholysis Neurologic:  alert & oriented X3 and DTRs symmetrical and normal.   No tremor Skin:  Intact without suspicious lesions or rashes.No jaundice Cervical Nodes:  No lymphadenopathy noted Axillary  Nodes:  No palpable lymphadenopathy Psych:  memory intact for recent and remote, normally interactive, and good eye contact.     Impression & Recommendations:  Problem # 1:  DIARRHEA (ICD-787.91)  Orders: Venipuncture (81191) TLB-BMP (Basic Metabolic Panel-BMET) (80048-METABOL) TLB-CBC Platelet - w/Differential  (85025-CBCD) TLB-Hepatic/Liver Function Pnl (80076-HEPATIC) TLB-TSH (Thyroid Stimulating Hormone) (84443-TSH) TLB-Sedimentation Rate (ESR) (85652-ESR)  Problem # 2:  HYPERLIPIDEMIA (ICD-272.4) on statin His updated medication list for this problem includes:    Vytorin 10-20 Mg Tabs (Ezetimibe-simvastatin) .Marland Kitchen... 1 qhs  Complete Medication List: 1)  Vytorin 10-20 Mg Tabs (Ezetimibe-simvastatin) .Marland Kitchen.. 1 qhs 2)  Neurontin 300 Mg Caps (Gabapentin) .Marland Kitchen.. 1 by mouth once daily 3)  Oscimin 0.125 Mg Subl (Hyoscyamine sulfate) .Marland Kitchen.. 1 under tongue as needed for bloating  Patient Instructions: 1)  Continue Align. Prescriptions: OSCIMIN 0.125 MG SUBL (HYOSCYAMINE SULFATE) 1 under tongue as needed for bloating  #20 x 0   Entered and Authorized by:   Marga Melnick MD   Signed by:   Marga Melnick MD on 03/22/2010   Method used:   Print then Give to Patient   RxID:   [id number]    Orders Added: 1)  Est. Patient Level IV [46962] 2)  Venipuncture [95284] 3)  TLB-BMP (Basic Metabolic Panel-BMET) [80048-METABOL] 4)  TLB-CBC Platelet - w/Differential [85025-CBCD] 5)  TLB-Hepatic/Liver Function Pnl [80076-HEPATIC] 6)  TLB-TSH (Thyroid Stimulating Hormone) [84443-TSH] 7)  TLB-Sedimentation Rate (ESR) [85652-ESR]  Appended Document: Follow-up

## 2010-06-02 NOTE — Assessment & Plan Note (Signed)
Summary: TB TEST,CBS  Nurse Visit    Prior Medications: VYTORIN 10-20 MG  TABS (EZETIMIBE-SIMVASTATIN) 1 qhs Current Allergies: No known allergies    PPD Application    Vaccine Type: PPD    Site: left forearm    Mfr: Sanofi Pasteur    Dose: 0.1 ml    Route: ID    Given by: Floydene Flock CMA    Exp. Date: 02/21/2008    Lot #: [id number]   Orders Added: 1)  TB Skin Test Maurine.Goes    ]    Appended Document: TB TEST,CBS   PPD Results    Date of reading: 02/12/2008    Results: < 5mm    Interpretation: negative

## 2010-06-02 NOTE — Letter (Signed)
Summary: E Mail from Patient Regarding GI Issues  E Mail from Patient Regarding GI Issues   Imported By: Lanelle Bal 03/25/2010 10:24:09  _____________________________________________________________________  External Attachment:    Type:   Image     Comment:   External Document

## 2010-06-02 NOTE — Assessment & Plan Note (Signed)
Summary: cpx & lab/cbs   Vital Signs:  Patient profile:   70 year old male Height:      71.75 inches Weight:      189.2 pounds BMI:     25.93 Temp:     98.3 degrees F oral Pulse rate:   72 / minute Resp:     14 per minute BP sitting:   126 / 84  (left arm) Cuff size:   large  Vitals Entered By: Shonna Chock (August 28, 2008 8:19 AM) CC: cpx with fasting labs, Lipid Management   CC:  cpx with fasting labs and Lipid Management.  History of Present Illness: Asymptomatic except increasing arthritic pain esp  in hands. Rx: Aleve once daily in am. NMR (2004) reviewed ; LDL goal = < 90.  Lipid Management History:      Positive NCEP/ATP III risk factors include male age 26 years old or older and HDL cholesterol less than 40.  Negative NCEP/ATP III risk factors include non-diabetic, no family history for ischemic heart disease, non-tobacco-user status, non-hypertensive, no ASHD (atherosclerotic heart disease), no prior stroke/TIA, no peripheral vascular disease, and no history of aortic aneurysm.     Preventive Screening-Counseling & Management     Smoking Status: quit     Does Patient Exercise: yes  Allergies (verified): No Known Drug Allergies  Past History:  Past Medical History:    Hyperlipidemia; Gilbert's Syndrome     Spinal stenosis ,LS spine    Colonic polyps, hx of    Skin cancer, hx , Basal Cell X2 , Dr Mayford Knife  Past Surgical History:    Hemorrhoidectomy 1986    Inguinal herniorrhaphy  1968    Vasectomy    Colon polypectomy 2005, Dr Kinnie Scales, due 2014    Tonsillectomy    Arthroscopy ,shoulder  Family History:    Father: MI @ 28, RHD(no RA)    Mother: CVA @ 2, OA    Siblings: bro  Down's Syndrome; bro pacer for tachyarrhythmia    MGM CVA in 67s; MGF CVA in 43s  Social History:    healthy diet    Occupation: Orthodontist    Married    Former Smoker:1970    Alcohol use-yes: socially    Regular exercise-yes: 4-6X/week CVE w/o symptoms  Review of Systems      The patient complains of decreased hearing.  The patient denies anorexia, fever, weight loss, weight gain, vision loss, hoarseness, chest pain, syncope, dyspnea on exertion, peripheral edema, prolonged cough, headaches, hemoptysis, abdominal pain, melena, hematochezia, severe indigestion/heartburn, hematuria, suspicious skin lesions, depression, unusual weight change, abnormal bleeding, enlarged lymph nodes, and angioedema.         Bilat hearing loss, chronic, ? from [place] in [place].Wax extraction every 6 weeks by Dr Dorma Russell. Occa dyspepsia; at bedtime Zantac 150 mg. MS:  Complains of joint pain and loss of strength; denies joint redness, joint swelling, low back pain, mid back pain, muscle aches, muscle weakness, and thoracic pain; ? loss of hand strength .  Physical Exam  General:  well-nourished,in no acute distress; alert,appropriate and cooperative throughout examination Head:  Normocephalic and atraumatic without obvious abnormalities. No apparent alopecia Eyes:  No corneal or conjunctival inflammation noted.Perrla. Funduscopic exam benign, without hemorrhages, exudates or papilledema.  Ears:  External ear exam shows no significant lesions or deformities.  Otoscopic examination reveals clear canals, tympanic membranes are intact bilaterally without bulging, retraction, inflammation or discharge. Hearing is grossly normal bilaterally. Nose:  External nasal examination shows no  deformity or inflammation. Nasal mucosa are pink and moist without lesions or exudates. Septal dislocation Mouth:  Oral mucosa and oropharynx without lesions or exudates.  Teeth in good repair. Neck:  No deformities, masses, or tenderness noted. Slight thyroid asymmetry Lungs:  Normal respiratory effort, chest expands symmetrically. Lungs are clear to auscultation, no crackles or wheezes. Heart:  Normal rate and regular rhythm. S1 and S2 normal without gallop, murmur, click, rub. S4 Abdomen:  Bowel sounds  positive,abdomen soft and non-tender without masses, organomegaly or hernias noted. Rectal:  No external abnormalities noted. Normal sphincter tone. No rectal masses or tenderness. Genitalia:  Testes bilaterally descended without nodularity, tenderness or masses. No scrotal masses or lesions. No penis lesions or urethral discharge. Varicocoele on L Prostate:  no nodules, no asymmetry, no induration, and 1+ enlarged.   Msk:  No deformity or scoliosis noted of thoracic or lumbar spine.   Pulses:  R and L carotid,radial,dorsalis pedis and posterior tibial pulses are full and equal bilaterally Extremities:  No clubbing, cyanosis, edema, or deformity noted with normal full range of motion of all joints.   Crepitus of knees Neurologic:  alert & oriented X3 and DTRs symmetrical and normal.   Skin:  Intact without suspicious lesions or rashes Cervical Nodes:  No lymphadenopathy noted Axillary Nodes:  No palpable lymphadenopathy Psych:  memory intact for recent and remote, normally interactive, and good eye contact.     Impression & Recommendations:  Problem # 1:  ROUTINE GENERAL MEDICAL EXAM@HEALTH  CARE FACL (ICD-V70.0)  Orders: EKG w/ Interpretation (93000) Venipuncture (96295) TLB-Lipid Panel (80061-LIPID) TLB-BMP (Basic Metabolic Panel-BMET) (80048-METABOL) TLB-CBC Platelet - w/Differential (85025-CBCD) TLB-Hepatic/Liver Function Pnl (80076-HEPATIC) TLB-TSH (Thyroid Stimulating Hormone) (84443-TSH) TLB-Uric Acid, Blood (84550-URIC) TLB-Rheumatoid Factor (RA) (28413-KG) TLB-Sedimentation Rate (ESR) (85652-ESR) T-Vitamin D (25-Hydroxy) (40102-72536)  Problem # 2:  HYPERLIPIDEMIA (ICD-272.4)  His updated medication list for this problem includes:    Vytorin 10-20 Mg Tabs (Ezetimibe-simvastatin) .Marland Kitchen... 1 qhs  Orders: EKG w/ Interpretation (93000)  Problem # 3:  HYPERPLASIA PROSTATE UNS W/O UR OBST & OTH LUTS (ICD-600.90)  Problem # 4:  COLONIC POLYPS, HX OF (ICD-V12.72) as per Dr  Kinnie Scales  Problem # 5:  GILBERT'S SYNDROME (ICD-277.4)  Problem # 6:  PAIN IN JOINT, MULTIPLE SITES (ICD-719.49)  Orders: TLB-Uric Acid, Blood (84550-URIC) TLB-Rheumatoid Factor (RA) (64403-KV) TLB-Sedimentation Rate (ESR) (85652-ESR) T-Vitamin D (25-Hydroxy) (42595-63875)  Complete Medication List: 1)  Vytorin 10-20 Mg Tabs (Ezetimibe-simvastatin) .Marland Kitchen.. 1 qhs 2)  Neurontin 300 Mg Caps (Gabapentin) .Marland Kitchen.. 1 by mouth once daily  Lipid Assessment/Plan:      Based on NCEP/ATP III, the patient's risk factor category is "0-1 risk factors".  The patient's lipid goals have been set as follows: Total cholesterol goal is 200; LDL cholesterol goal is 90; HDL cholesterol goal is 40; Triglyceride goal is 150.  His LDL cholesterol goal has not been met.  Secondary causes for hyperlipidemia have been ruled out.  He has been counseled on adjunctive measures for lowering his cholesterol and has been provided with dietary instructions.    Patient Instructions: 1)  Your LDL goal = < 90 based on NMR Lipoprofile

## 2010-06-02 NOTE — Progress Notes (Signed)
Summary: lab at [facility] [phone]  Phone Note Call from Patient   Summary of Call: received lab order from dr hopper lipid,hepatic dx [phone] called patient & scheduled lab at [facility] [phone]  Initial call taken by: Okey Regal Spring,  October 10, 2007 8:38 AM

## 2010-06-02 NOTE — Progress Notes (Signed)
Summary: back pain worse- dr Arynn Armand/[facility]  Phone Note Call from Patient Call back at Work Phone [phone]   Caller: Patient Summary of Call: deana from dr Caryl Comes office called for patient- patient has appt [phone] but pain in back is getting worse he is concerned it may be a kidney stone Initial call taken by: Okey Regal Spring,  July 18, 2007 8:42 AM  Follow-up for Phone Call        Spoke with deana who says pt has appt tomorrow but back pain is worse may be kidney stone recommend pt to Ed for today...................................................................Marland KitchenKandice Hams  July 18, 2007 9:52 AM  Follow-up by: Kandice Hams,  July 18, 2007 9:52 AM

## 2010-06-02 NOTE — Progress Notes (Signed)
Summary: pt needs early morning appt with Dr Alwyn Ren  Phone Note Call from Patient   Caller: Patient Summary of Call: patient called and made appointment for fasting labs on Wed 11/23--because he said Dr Alwyn Ren told him to.    What orders and codes do you want?? Initial call taken by: Jerolyn Shin,  March 15, 2010 12:53 PM  Follow-up for Phone Call        Patient had CPX labs in 08/2009, Im not sure what labs Dr.Hopper was referring to, will forward  Follow-up by: Shonna Chock CMA,  March 15, 2010 1:40 PM  Additional Follow-up for Phone Call Additional follow up Details #1::        I will order labs after I see him; I just wanted to see him fasting Additional Follow-up by: Marga Melnick MD,  March 15, 2010 5:31 PM    Additional Follow-up for Phone Call Additional follow up Details #2::    called [phone]--spoke to wife --patient out of town; asked her to give spouse message to call us to make an early morning appointment with Dr Alwyn Ren (come in fasting) and that I was going to cancel lab appt for 11/23.Jerolyn Shin  March 17, 2010 9:23 AM  Additional Follow-up for Phone Call Additional follow up Details #3:: Details for Additional Follow-up Action Taken: pt has appt tuesday 03/22/10 @ 8 am for a fasting followup Additional Follow-up by: Lavell Islam,  March 18, 2010 12:39 PM

## 2010-06-02 NOTE — Assessment & Plan Note (Signed)
Summary: lower back pain//ph   Vital Signs:  Patient Profile:   70 Years Old Male Height:     71.50 inches Weight:      193.25 pounds Pulse rate:   60 / minute Pulse rhythm:   regular Resp:     12 per minute BP sitting:   126 / 72  (left arm) Cuff size:   large  Pt. in pain?   yes    Location:   R flank     Intensity:   3; 7-8 on 07/18/07    Type:       sharp  Vitals Entered By: Wendall Stade (July 19, 2007 2:35 PM)                  Chief Complaint:  follow up ER and Back pain.  History of Present Illness: ER ruled out kidney stone and shingles and felt it was a muscle strain. This is a follow up. Pain began3/16 after long distant driving on 8/11 & 01/12/77. On 3/14 he had knot in same area.Pain exacerbated  3/18; severe  3/19. Pain better with Robaxin X 3 & Percocet X1 . PMH pain on L due to spinal stenosis.  Back Pain      This is a 70 year old man who presents with Back pain.  The patient reports rest pain, inability to work, and inability to care for self, but denies fever, chills, weakness, loss of sensation, fecal incontinence, urinary incontinence, urinary retention, and dysuria.  The pain is located in the right thoracic back.  The pain began at home and gradually.  The pain is made worse by standing or walking and extension.  The pain is made better by muscle relaxants, opioids, sitting or bending forward, and heat.      Current Allergies (reviewed today): No known allergies       Physical Exam  General:     Well-developed,well-nourished,in no acute distress; alert,appropriate and cooperative throughout examination Msk:     No deformity or scoliosis noted of thoracic or lumbar spine.   Neurologic:      Negative SLR to 90 degrees;strength normal in all extremities, gait ( heel, toe) normal, and DTRs symmetrical and normal.   Skin:     Intact without suspicious lesions or rashes    Impression & Recommendations:  Problem # 1:  BACK PAIN (ICD-724.5)   Problem # 2:  SPINAL STENOSIS, LUMBAR (ICD-724.02)  Complete Medication List: 1)  Crestor 20 Mg Tabs (Rosuvastatin calcium) .Marland Kitchen.. 1 qd   Patient Instructions: 1)  Isometrics with Cybex as discussed    ]

## 2010-06-02 NOTE — Miscellaneous (Signed)
Summary: [facility]  [facility]   Imported By: Lanelle Bal 02/07/2010 13:46:00  _____________________________________________________________________  External Attachment:    Type:   Image     Comment:   External Document

## 2010-06-02 NOTE — Assessment & Plan Note (Signed)
Summary: cpx & lab.cbs   Vital Signs:  Patient Profile:   70 Years Old Male Height:     71.50 inches Weight:      181 pounds Pulse rate:   70 / minute Resp:     12 per minute BP sitting:   122 / 70  (right arm)  Pt. in pain?   no  Vitals Entered By: Doristine Devoid (April 26, 2007 8:30 AM)                   Chief Complaint:  CPX AND LABS.  History of Present Illness: See EKG; RBBB is new since 11/07. Heart healthy diet; CVE 4-6X/week for 60 -90 min w/o cardiopulmonary symptoms.  General Medical HPI:        He is doing well at this time and is not having any significant new complaints.  Lipid Management History:      Positive NCEP/ATP III risk factors include male age 65 years old or older.  Negative NCEP/ATP III risk factors include non-diabetic, no family history for ischemic heart disease, non-tobacco-user status, non-hypertensive, no ASHD (atherosclerotic heart disease), no prior stroke/TIA, no peripheral vascular disease, and no history of aortic aneurysm.       Past Medical History:    Hyperlipidemia    Gilbert's Syndrome  Past Surgical History:    Colon polypectomy , last 1/08    Hemorrhoidectomy    Inguinal herniorrhaphy    Tonsillectomy    Vasectomy    R shoulder surgery   Family History:    Father: MI @ 10    Mother: CVA @ 62    Siblings: bro  Down's Syndrome    MGM CVA, MGF CVA  Social History:    healthy diet   Risk Factors:  Tobacco use:  quit    Year quit:  1970 Alcohol use:  yes    Type:  socially Exercise:  yes    Type:  see HPI  Family History Risk Factors:    Family History of MI in females < 64 years old:  no    Family History of MI in males < 65 years old:  no   Review of Systems  General      Denies chills, fatigue, fever, loss of appetite, malaise, sleep disorder, sweats, weakness, and weight loss.  Eyes      Denies blurring, discharge, double vision, eye irritation, eye pain, halos, itching, light sensitivity, red  eye, vision loss-1 eye, and vision loss-both eyes.      Last exam 12/08, NAD  ENT      Denies decreased hearing, difficulty swallowing, ear discharge, earache, hoarseness, nasal congestion, nosebleeds, postnasal drainage, ringing in ears, sinus pressure, and sore throat.      Excess wax occa; using Neti pot once daily   CV      Denies bluish discoloration of lips or nails, chest pain or discomfort, difficulty breathing at night, difficulty breathing while lying down, leg cramps with exertion, palpitations, shortness of breath with exertion, and swelling of hands.      R ankle swelling BETTER with exercise  Resp      Denies cough, excessive snoring, hypersomnolence, morning headaches, shortness of breath, and sputum productive.  GI      Complains of indigestion.      Denies abdominal pain, bloody stools, change in bowel habits, constipation, dark tarry stools, diarrhea, loss of appetite, nausea, and vomiting.      Prn Zantac or Pepcid AC  GU  Denies decreased libido, dysuria, hematuria, incontinence, nocturia, urinary frequency, and urinary hesitancy.  MS      Denies joint pain, joint redness, joint swelling, loss of strength, low back pain, mid back pain, muscle aches, muscle , cramps, muscle weakness, stiffness, and thoracic pain.      PMH spinal stenosis; fallen arches with thickening of Subcutaneously tissues  Derm      Denies changes in color of skin, changes in nail beds, dryness, excessive perspiration, flushing, hair loss, itching, lesion(s), poor wound healing, and rash.  Neuro      Complains of numbness.      Denies difficulty with concentration, disturbances in coordination, headaches, poor balance, sensation of room spinning, and tingling.      LLE numbness w/o pain  Psych      Denies anxiety, depression, easily angered, easily tearful, and irritability.  Endo      Denies cold intolerance, excessive hunger, excessive thirst, excessive urination, heat intolerance,  polyuria, and weight change.  Heme      Denies abnormal bruising and bleeding.  Allergy      Denies hives or rash, itching eyes, persistent infections, seasonal allergies, and sneezing.   Physical Exam  General:     Well-developed,well-nourished,in no acute distress; alert,appropriate and cooperative throughout examination Head:     Normocephalic and atraumatic without obvious abnormalities. No apparent alopecia or balding. Eyes:     No corneal or conjunctival inflammation noted.Marland Kitchen Perrla. Funduscopic exam benign, without hemorrhages, exudates or papilledema.  Ears:     External ear exam shows no significant lesions or deformities.  Otoscopic examination reveals clear canals, tympanic membranes are intact bilaterally without bulging, retraction, inflammation or discharge. Hearing is grossly normal bilaterally. Nose:     External nasal examination shows no deformity or inflammation. Nasal mucosa are pink and moist without lesions or exudates. Mouth:     Oral mucosa and oropharynx without lesions or exudates.  Teeth in good repair. Neck:     No deformities, masses, or tenderness noted. Lungs:     Normal respiratory effort, chest expands symmetrically. Lungs are clear to auscultation, no crackles or wheezes. Heart:     normal rate, regular rhythm, no gallop, no rub, no JVD, no HJR, and grade 1/2 /6 systolic murmur.   Abdomen:     Bowel sounds positive,abdomen soft and non-tender without masses, organomegaly or hernias noted. Rectal:     No external abnormalities noted. Normal sphincter tone. No rectal masses or tenderness. FOB neg Genitalia:     Testes bilaterally descended without nodularity, tenderness or masses. No scrotal masses or lesions. No penis lesions or urethral discharge. Prostate:     ULN w/o nodules Msk:     No deformity or scoliosis noted of thoracic or lumbar spine.   Pulses:     R and L carotid,radial,dorsalis pedis and posterior tibial pulses are full and equal  bilaterally Extremities:     Minor DJD hands; pes planus with thickened cords. Varicosities RLE > LLE Neurologic:     strength normal in all extremities, gait normal, and DTRs symmetrical and normal.   Skin:     Intact without suspicious lesions or rashes Cervical Nodes:     No lymphadenopathy noted Axillary Nodes:     No palpable lymphadenopathy Psych:     Oriented X3, memory intact for recent and remote, normally interactive, good eye contact, not anxious appearing, and not depressed appearing.      Impression & Recommendations:  Problem # 1:  ROUTINE GENERAL  MEDICAL [email or web address]  CARE FACL (ICD-V70.0)  Orders: TLB-Lipid Panel (80061-LIPID) TLB-BMP (Basic Metabolic Panel-BMET) (80048-METABOL) TLB-CBC Platelet - w/Differential (85025-CBCD) TLB-Hepatic/Liver Function Pnl (80076-HEPATIC) TLB-TSH (Thyroid Stimulating Hormone) (84443-TSH) TLB-PSA (Prostate Specific Antigen) (84153-PSA) EKG w/ Interpretation (93000)   Problem # 2:  HYPERLIPIDEMIA (ICD-272.4)  His updated medication list for this problem includes:    Vytorin 10-20 Mg Tabs (Ezetimibe-simvastatin)   Problem # 3:  GILBERT'S SYNDROME (ICD-277.4)  Problem # 4:  RBBB (ICD-426.4)  Orders: EKG w/ Interpretation (93000)   Complete Medication List: 1)  Vytorin 10-20 Mg Tabs (Ezetimibe-simvastatin)  Lipid Assessment/Plan:      Based on NCEP/ATP III, the patient's risk factor category is "0-1 risk factors".  From this information, the patient's calculated lipid goals are as follows: Total cholesterol goal is 200; LDL cholesterol goal is 160; HDL cholesterol goal is 40; Triglyceride goal is 150.  His LDL cholesterol goal has not been met.  Secondary causes for hyperlipidemia have been ruled out.  He has been counseled on adjunctive measures for lowering his cholesterol and has been provided with dietary instructions.     Patient Instructions: 1)  LDL goal = < 90; ideally < 75. Complete stool cards every January.     ]

## 2010-06-02 NOTE — Miscellaneous (Signed)
Summary: Immunization Entry   Immunization History:  Influenza Immunization History:    Influenza:  historical (02/13/2009)   Immunization History:  Influenza Immunization History:    Influenza:  historical (02/13/2009) Given at [facility]) 1.6XW, lot: [id number] P1

## 2010-09-16 NOTE — Op Note (Signed)
NAMEABHIRAM, CRIADO NO.:  [id number]   MEDICAL RECORD NO.:  [id number]          PATIENT TYPE:  AMB   LOCATION:  DSC                          FACILITY:  [facility]   PHYSICIAN:  Claude Manges. Whitfield, M.D.DATE OF BIRTH:  [birth year]   DATE OF PROCEDURE:  06/26/2006  DATE OF DISCHARGE:                               OPERATIVE REPORT   PREOPERATIVE DIAGNOSES:  1. Impingement, right shoulder.  2. Degenerative joint disease acromioclavicular joint, right shoulder.   POSTOPERATIVE DIAGNOSIS:  1. Impingement, right shoulder.  2. Degenerative joint disease acromioclavicular joint, right shoulder.  3. Fraying of superior anterior glenoid labrum.   PROCEDURE:  1. Arthroscopic debridement, anterior glenoid labrum.  2. Arthroscopic subacromial decompression.  3. Arthroscopic distal clavicle resection.   SURGEON:  Claude Manges. Cleophas Dunker, M.D.   ASSISTANT:  Arnoldo Morale, [facility]   ANESTHESIA:  General orotracheal with supplemental interscalene nerve  block.   COMPLICATIONS:  None.   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  By exam, hHe appears to have  degenerative change and tenderness at the [facility] joint as well as  impingement syndrome.  He has had an MRI scan that confirms the above.  He has responded very nicely to several cortisone injections and a  course of exercises only to have the pain recur.  He is now to have an  arthroscopic evaluation.   PROCEDURE:  The patient comfortable on operating table and under general  orotracheal anesthesia, the patient was placed a semi-sitting position  with the shoulder frame.  On examination the shoulder was not unstable,  there was no loss of motion.   The right shoulder was then prepped circumferentially from the base of  the neck to the mid forearm with DuraPrep.  Sterile draping was  performed.   A marking pen was used to outline the [facility] joint, the coracoid and the  acromion.  At a point a fingerbreadth posterior and medial to the  posterior angle acromion, a small stab wound was made and the  arthroscope easily placed into the shoulder joint.  Diagnostic  arthroscopy revealed an intact biceps tendon.  The rotator cuff was not  torn.  There were no loose bodies.  I did not see any appreciable  chondromalacia of the glenoid or the humeral head.  There was some  fraying and some tearing of the fibers of the superior anterior glenoid  labrum but the labrum was not detached from the glenoid.  A second  portal was established anteriorly and shaving of frayed labrum was  performed and then stabilized with the Arthrocare wand.  There was no  synovitis and again I carefully evaluate the rotator cuff without  evidence of a tear either partial or full.   The arthroscope was then placed in the subacromial space posteriorly,  cannula in the subacromial space anteriorly and third portal established  in lateral subacromial space.  An arthroscopic subacromial decompression  was performed removing considerable bursal tissue.  There was fair  amount of inflamed bursal tissue anteriorly which was  debrided.  There  was some overhang of the anterior and the lateral acromion and with a 6  mm bur, an anterior and lateral acromioplasty was performed.  Had a very  nice resection.  Had a very nice space. Carefully evaluated the rotator  cuff without evidence of a tear.   There was considerable inflammatory tissue about the [facility] joint with  synovitis.  The articular wand was removed.  It was used to remove the  synovitis and a 6 mm bur was used to decompress both the clavicular side  of the [facility] joint as well as the distal clavicle, had a very nice  resection, no evidence of impingement.  There was no bleeding and I did  not see any further inflamed tissue.   Subacromial space was then evaluated with evidence of loose material.  There was no bleeding.  The instruments were  then removed.  The anterior  and lateral portals were closed with interrupted 4-0 Ethilon.  The  posterior portal was left open.  There were all infiltrated with 0.25%  Marcaine with epinephrine.  Sterile bulky dressing was applied followed  by a sling.   PLAN:  Office one week.  Percocet for pain.      Claude Manges. Cleophas Dunker, M.D.  Electronically Signed     PWW/MEDQ  D:  06/26/2006  T:  06/26/2006  Job:  [id number]

## 2010-11-09 ENCOUNTER — Other Ambulatory Visit: Payer: Self-pay | Admitting: Internal Medicine

## 2010-11-09 DIAGNOSIS — M549 Dorsalgia, unspecified: Secondary | ICD-10-CM

## 2010-11-09 DIAGNOSIS — M4800 Spinal stenosis, site unspecified: Secondary | ICD-10-CM

## 2010-11-22 ENCOUNTER — Telehealth: Payer: Self-pay | Admitting: Internal Medicine

## 2010-11-22 NOTE — Telephone Encounter (Signed)
In reference to your recent Neurology Referral back to Dr. Sandria Manly at [facility], they have sent me (2) faxes stating they will not schedule this patient for an appointment unless they have recent office notes for his Spinal Stenosis.  Even though Dr. Sandria Manly has seen patient, they state last appt was in 2002.  Please advise.

## 2010-12-05 ENCOUNTER — Ambulatory Visit (INDEPENDENT_AMBULATORY_CARE_PROVIDER_SITE_OTHER): Payer: Medicare Other | Admitting: Internal Medicine

## 2010-12-05 ENCOUNTER — Encounter: Payer: Self-pay | Admitting: Internal Medicine

## 2010-12-05 DIAGNOSIS — Z85828 Personal history of other malignant neoplasm of skin: Secondary | ICD-10-CM

## 2010-12-05 DIAGNOSIS — M48061 Spinal stenosis, lumbar region without neurogenic claudication: Secondary | ICD-10-CM

## 2010-12-05 DIAGNOSIS — Z23 Encounter for immunization: Secondary | ICD-10-CM

## 2010-12-05 DIAGNOSIS — N400 Enlarged prostate without lower urinary tract symptoms: Secondary | ICD-10-CM

## 2010-12-05 DIAGNOSIS — Z0000 Encounter for general adult medical examination without abnormal findings: Secondary | ICD-10-CM

## 2010-12-05 DIAGNOSIS — Z125 Encounter for screening for malignant neoplasm of prostate: Secondary | ICD-10-CM

## 2010-12-05 DIAGNOSIS — E785 Hyperlipidemia, unspecified: Secondary | ICD-10-CM

## 2010-12-05 DIAGNOSIS — Z860100 Personal history of colon polyps, unspecified: Secondary | ICD-10-CM

## 2010-12-05 LAB — PSA: PSA: 1.11 ng/mL (ref 0.10–4.00)

## 2010-12-05 MED ORDER — GABAPENTIN 100 MG PO CAPS: 100.0000 mg | ORAL_CAPSULE | ORAL | Status: DC

## 2010-12-05 MED ORDER — TETANUS-DIPHTH-ACELL PERTUSSIS 5-2.5-18.5 LF-MCG/0.5 IM SUSP
0.5000 mL | Freq: Once | INTRAMUSCULAR | Status: AC
  Administered 2010-12-05: 0.5 mL via INTRAMUSCULAR

## 2010-12-05 NOTE — Patient Instructions (Signed)
Consider the exercises which may help prevent symptoms from spinal stenosis as we discussed.  The advanced cholesterol test will assess your long-term risk related to dyslipidemia with increased LDL  particle # & low HDL. Your father's history of coronary disease/heart attack at 55 is of  concern.

## 2010-12-05 NOTE — Progress Notes (Signed)
Subjective:    Patient ID: James Noble, male    DOB: 1941/01/20, 70 y.o.   MRN: [id number]  HPI Medicare Wellness Visit:  The following psychosocial & medical history were reviewed as required by Medicare.   Social history: caffeine: 1 cup coffee , alcohol:  2-3 wine/ day ,  tobacco use : quit 1970  & exercise : CVE 4-6X/ week 60 min.   Home & personal  safety / fall risk: no issues, activities of daily living: no limitations , seatbelt use : yes , and smoke alarm employment : yes .  Power of Attorney/Living Will status : in place  Vision ( as recorded per Nurse) & Hearing  evaluation :  Wall chart read with lenses; hearing aids from Dr Dorma Russell Orientation :oriented X3 , memory & recall :normal, spelling testing: good,and mood & affect :  normal . Depression / anxiety: no Travel history : 2006 [place] , immunization status :up to date except ? Pneumovax , transfusion history:  no, and preventive health surveillance ( colonoscopies, BMD , etc as per protocol/ SOC): see PMH, Dental care:  Every 6 months . Chart reviewed &  Updated. Active issues reviewed & addressed.       Review of Systems Patient reports no vision  changes,anorexia, weight change, fever ,adenopathy, persistant / recurrent hoarseness, swallowing issues, chest pain,palpitations, edema,persistant cough, hemoptysis, dyspnea(rest, exertional, paroxysmal nocturnal), gastrointestinal  bleeding (melena, rectal bleeding), abdominal pain, excessive heart burn, GU symptoms( dysuria, hematuria, pyuria, voiding/incontinence  issues) syncope, focal weakness, numbness & tingling, skin/hair/nail changes,abnormal bruising/bleeding,or musculoskeletal symptoms/signs.  Sciatica LLE after weaning off gabapentin initially & running.Cough with PNDrainage     Objective:   Physical Exam Gen.: Healthy and well-nourished in appearance. Alert, appropriate and cooperative throughout exam. Head: Normocephalic without obvious abnormalities;  no alopecia    Eyes: No corneal or conjunctival inflammation noted. Pupils equal round reactive to light and accommodation. Fundal exam is benign without hemorrhages, exudate, papilledema. Extraocular motion intact. Vision grossly normal with lenses. Ears: External  ear exam reveals no significant lesions or deformities. Canals clear .TMs normal. Hearing is grossly decreased bilaterally. Nose: External nasal exam reveals no deformity or inflammation. Nasal mucosa are pink and moist. No lesions or exudates noted. Septum  Not deviated  Mouth: Oral mucosa and oropharynx reveal no lesions or exudates. Teeth in excellent  repair. Neck: No deformities, masses, or tenderness noted. Range of motion &  Thyroid normal. Lungs: Normal respiratory effort; chest expands symmetrically. Lungs are clear to auscultation without rales, wheezes, or increased work of breathing. Heart: SLOW rate and regular  rhythm. Normal S1 and S2. No gallop, click, or rub. No  murmur. Abdomen: Bowel sounds normal; abdomen soft and nontender. No masses, organomegaly or hernias noted. Genitalia/DRE:  Small varicocele is noted on the left; prostate is upper limits of normal without nodularity or induration. .                                                                                   Musculoskeletal/extremities: No deformity or scoliosis noted of  the thoracic or lumbar spine. No clubbing, cyanosis, edema, or deformity noted. Range of motion  normal .Tone &  strength  normal.Joints normal. Nail health  Good. Negative SLR; he lay back & sat up w/o help Vascular: Carotid, radial artery, dorsalis pedis and  posterior tibial pulses are full and equal. No bruits present. Neurologic: Alert and oriented x3. Deep tendon reflexes symmetrical and normal.Heel & toe gait WNL.          Skin: Intact without suspicious lesions or rashes. Lymph: No cervical, axillary, or inguinal lymphadenopathy present. Psych: Mood and affect are normal. Normally interactive                                                                                          Assessment & Plan:  #1 Medicare Wellness Exam; criteria met ; data entered #2 Problem List reviewed ; Assessment/ Recommendations made  #3 sciatica left lower extremity in the context of known spinal stenosis Plan: see Orders

## 2010-12-06 NOTE — Telephone Encounter (Signed)
Patient seen in office 12-06-10.  Notes faxed to [facility].

## 2010-12-22 ENCOUNTER — Encounter: Payer: Self-pay | Admitting: Internal Medicine

## 2011-01-23 LAB — BASIC METABOLIC PANEL
BUN: 13
CO2: 31
Calcium: 9.4
Chloride: 103
Creatinine, Ser: 0.87
GFR calc Af Amer: >60
GFR calc non Af Amer: >60
Glucose, Bld: 94
Potassium: 4.5
Sodium: 142

## 2011-01-23 LAB — URINALYSIS, ROUTINE W REFLEX MICROSCOPIC
Bilirubin Urine: NEGATIVE
Glucose, UA: NEGATIVE
Hgb urine dipstick: NEGATIVE
Ketones, ur: NEGATIVE
Nitrite: NEGATIVE
Protein, ur: NEGATIVE
Specific Gravity, Urine: 1.018
Urobilinogen, UA: 0.2
pH: 6

## 2011-01-24 ENCOUNTER — Encounter: Payer: Self-pay | Admitting: Internal Medicine

## 2011-01-25 ENCOUNTER — Ambulatory Visit (INDEPENDENT_AMBULATORY_CARE_PROVIDER_SITE_OTHER): Payer: Medicare Other | Admitting: Internal Medicine

## 2011-01-25 ENCOUNTER — Encounter: Payer: Self-pay | Admitting: Internal Medicine

## 2011-01-25 VITALS — BP 122/78 | HR 60 | Wt 186.6 lb

## 2011-01-25 DIAGNOSIS — E785 Hyperlipidemia, unspecified: Secondary | ICD-10-CM

## 2011-01-25 MED ORDER — ROSUVASTATIN CALCIUM 20 MG PO TABS: 20.0000 mg | ORAL_TABLET | Freq: Every day | ORAL | Status: DC

## 2011-01-25 NOTE — Progress Notes (Signed)
  Subjective:    Patient ID: James Noble, male    DOB: 09-Jul-1940, 70 y.o.   MRN: [id number]  HPI Dyslipidemia assessment: Prior Advanced Lipid Testing: NMR LDL goal = < 100.   Family history of premature CAD/ MI: father MI @ 4 .  Nutrition: heart healthy .  Exercise: 4-6x/week CVE . Diabetes : A1c 5.3% . HTN: no. Smoking history  : quit 1970 .   Weight :  stable.  Lab results reviewed :[facility] Heart Panel risks: uric acid 7.7; NOT hyperabsorber;; small dense LDL; LDL 129; alpha 3 HDL high.     Review of Systems ROS: fatigue: no ; chest pain : no ;claudication: no; palpitations: no; abd pain/bowel changes: no ; myalgias:no (CK 111);  syncope : no ; memory loss: no;skin changes: no.     Objective:   Physical Exam General appearance is one of good health and nourishment w/o distress.  Eyes: No conjunctival inflammation    Heart:  Normal rate and regular rhythm. S1 and S2 normal without gallop, murmur, click, rub .S4  Lungs:Chest clear to auscultation; no wheezes, rhonchi,rales ,or rubs present.No increased work of breathing.   Abdomen: bowel sounds normal, soft and non-tender without masses, organomegaly or hernias noted.  No guarding or rebound . No AAA or bruits  Skin:Warm & dry.  Intact without suspicious lesions or rashes  Focused & intelligent           Assessment & Plan:  #1 dyslipidemia; risk outlined above. He is not hyper absorber; Zetia is superfluous. Change to Crestor 20 mg #2 premature coronary disease in his father who was a smoker  #3 hyperuricemia without go  Plan: See orders and recommendations.

## 2011-01-25 NOTE — Patient Instructions (Signed)
To prevent gout the minimal uric acid goal is < 7; preferred is < 6, ideally < 5 to prevent gout.  The most common cause of elevated uric acid is the ingestion of sugar from high fructose corn syrup sources. You should consume less than 40 grams  of sugar per day from foods and drinks with high fructose corn syrup as number 1, 2, 3, or #4 on the label.  Due to Web M.D. for information on purine in  Diet Please  schedule fasting Labs in 10-12 weeks : Lipids, hepatic panel, CK(272.4, 995.20).  Please bring these instructions to that Lab appt.

## 2011-05-02 HISTORY — PX: OTHER SURGICAL HISTORY: SHX169

## 2011-05-25 ENCOUNTER — Other Ambulatory Visit: Payer: Self-pay | Admitting: Family Medicine

## 2011-05-25 MED ORDER — AMOXICILLIN 875 MG PO TABS: 875.0000 mg | ORAL_TABLET | Freq: Two times a day (BID) | ORAL | Status: AC

## 2011-05-30 DIAGNOSIS — H6120 Impacted cerumen, unspecified ear: Secondary | ICD-10-CM | POA: Diagnosis not present

## 2011-06-14 ENCOUNTER — Other Ambulatory Visit: Payer: Self-pay | Admitting: Internal Medicine

## 2011-06-14 DIAGNOSIS — Z01818 Encounter for other preprocedural examination: Secondary | ICD-10-CM

## 2011-06-15 ENCOUNTER — Other Ambulatory Visit (INDEPENDENT_AMBULATORY_CARE_PROVIDER_SITE_OTHER): Payer: Medicare Other

## 2011-06-15 DIAGNOSIS — Z01818 Encounter for other preprocedural examination: Secondary | ICD-10-CM

## 2011-06-15 LAB — CBC WITH DIFFERENTIAL/PLATELET
Basophils Absolute: 0 10*3/uL (ref 0.0–0.1)
Basophils Relative: 0.3 % (ref 0.0–3.0)
Eosinophils Absolute: 0.3 10*3/uL (ref 0.0–0.7)
Eosinophils Relative: 5.1 % — ABNORMAL HIGH (ref 0.0–5.0)
HCT: 40 % (ref 39.0–52.0)
Hemoglobin: 13.4 g/dL (ref 13.0–17.0)
Lymphocytes Relative: 22.1 % (ref 12.0–46.0)
Lymphs Abs: 1.2 10*3/uL (ref 0.7–4.0)
MCHC: 33.5 g/dL (ref 30.0–36.0)
MCV: 97 fl (ref 78.0–100.0)
Monocytes Absolute: 0.2 10*3/uL (ref 0.1–1.0)
Monocytes Relative: 4.4 % (ref 3.0–12.0)
Neutro Abs: 3.6 10*3/uL (ref 1.4–7.7)
Neutrophils Relative %: 68.1 % (ref 43.0–77.0)
Platelets: 166 10*3/uL (ref 150.0–400.0)
RBC: 4.13 Mil/uL — ABNORMAL LOW (ref 4.22–5.81)
RDW: 13.5 % (ref 11.5–14.6)
WBC: 5.3 10*3/uL (ref 4.5–10.5)

## 2011-06-22 DIAGNOSIS — K219 Gastro-esophageal reflux disease without esophagitis: Secondary | ICD-10-CM | POA: Diagnosis not present

## 2011-06-22 DIAGNOSIS — K2990 Gastroduodenitis, unspecified, without bleeding: Secondary | ICD-10-CM | POA: Diagnosis not present

## 2011-06-22 DIAGNOSIS — Z86010 Personal history of colonic polyps: Secondary | ICD-10-CM | POA: Diagnosis not present

## 2011-06-22 DIAGNOSIS — K319 Disease of stomach and duodenum, unspecified: Secondary | ICD-10-CM | POA: Diagnosis not present

## 2011-06-22 DIAGNOSIS — K2970 Gastritis, unspecified, without bleeding: Secondary | ICD-10-CM | POA: Diagnosis not present

## 2011-06-22 DIAGNOSIS — K649 Unspecified hemorrhoids: Secondary | ICD-10-CM | POA: Diagnosis not present

## 2011-06-30 ENCOUNTER — Encounter: Payer: Self-pay | Admitting: Internal Medicine

## 2011-07-11 DIAGNOSIS — H903 Sensorineural hearing loss, bilateral: Secondary | ICD-10-CM | POA: Diagnosis not present

## 2011-07-11 DIAGNOSIS — H6120 Impacted cerumen, unspecified ear: Secondary | ICD-10-CM | POA: Diagnosis not present

## 2011-07-21 ENCOUNTER — Encounter: Payer: Self-pay | Admitting: Internal Medicine

## 2011-07-21 ENCOUNTER — Ambulatory Visit (INDEPENDENT_AMBULATORY_CARE_PROVIDER_SITE_OTHER): Payer: Medicare Other | Admitting: Internal Medicine

## 2011-07-21 VITALS — BP 150/92 | HR 74 | Temp 98.2°F | Wt 188.0 lb

## 2011-07-21 DIAGNOSIS — M48061 Spinal stenosis, lumbar region without neurogenic claudication: Secondary | ICD-10-CM | POA: Diagnosis not present

## 2011-07-21 DIAGNOSIS — R030 Elevated blood-pressure reading, without diagnosis of hypertension: Secondary | ICD-10-CM | POA: Diagnosis not present

## 2011-07-21 DIAGNOSIS — Z86010 Personal history of colonic polyps: Secondary | ICD-10-CM | POA: Diagnosis not present

## 2011-07-21 DIAGNOSIS — J029 Acute pharyngitis, unspecified: Secondary | ICD-10-CM

## 2011-07-21 LAB — POCT RAPID STREP A (OFFICE): Rapid Strep A Screen: NEGATIVE

## 2011-07-21 MED ORDER — LOSARTAN POTASSIUM 100 MG PO TABS: 100.0000 mg | ORAL_TABLET | Freq: Every day | ORAL | Status: DC

## 2011-07-21 MED ORDER — LOSARTAN POTASSIUM 100 MG PO TABS: ORAL_TABLET | ORAL | Status: DC

## 2011-07-21 NOTE — Progress Notes (Signed)
Addended by: Silvio Pate D on: 07/21/2011 04:50 PM   Modules accepted: Orders

## 2011-07-21 NOTE — Progress Notes (Signed)
  Subjective:    Patient ID: James Noble, male    DOB: [birth year], 71 y.o.   MRN: [id number]  HPI Preoperative evaluation for ptosis surgery his blood pressure was found to be 150/97. He has no history of hypertension. He has purchased a cuff and then monitoring it since 07/04/11. Blood pressures have ranged from 123/73 to a high of 169/104.   Review of Systems  Chest pain: no   Dyspnea: no   Claudication: no  Lightheadedness: rarely after standing after prolonged sitting   Urinary frequency: no   Edema: no   Preventitive Healthcare:  Exercise:high level CVE 4-6X/ week   Diet Pattern: heart healthy   Salt Restriction:yes  He awoke with a sore throat this morning; his grand children have had recurrent respiratory infectionsThe major and minor symptoms of rhinosinusitis were reviewed. These include nasal congestion/obstruction; nasal purulence; facial pain; anosmia; fatigue; fever; headache; halitosis; earache and dental pain. All absent      Objective:   Physical Exam He appears healthy and well-nourished; he is in no acute distress  No carotid bruits are present.  Heart rhythm and rate are normal with no significant murmurs or gallops.  Chest is clear with no increased work of breathing  There is no evidence of aortic aneurysm or renal artery bruits  He has no clubbing or edema.   Pedal pulses are intact   No ischemic skin changes are present    No  lymphadenopathy about the head, neck, or axilla noted.   Eyes: No conjunctival inflammation or lid edema is present.   Ears:  External ear exam shows no significant lesions or deformities.  Otoscopic examination reveals clear canals, tympanic membranes are intact bilaterally without bulging, retraction, inflammation or discharge. Hearing aids worn bilaterally  Nose:  External nasal examination shows no deformity or inflammation. Nasal mucosa are pink and moist without lesions or exudates. No septal dislocation or deviation.No  obstruction to airflow.   Oral exam: Dental hygiene is good; lips and gums are healthy appearing.There is no oropharyngeal erythema or exudate noted.   Neck:  No deformities,  masses, or tenderness noted.   Supple with full range of motion without pain.     Extremities:  No cyanosis or clubbing  noted    Skin: Warm & dry           Assessment & Plan:  #1 elevated blood pressure without prior diagnoses of hypertension. History of cerebrovascular accident in his mother and maternal grandmother.  #2 pharyngitis with negative beta strep. Symptomatic intervention will be discussed. He should report fever or purulent  secretions  Plan: Low dose angiotensin receptor blocker with ongoing monitor of blood pressure.

## 2011-07-21 NOTE — Patient Instructions (Addendum)
Plain Mucinex for thick secretions ;force NON dairy fluids . Use a Neti pot daily as needed for sinus congestion .Nasal cleansing in the shower as discussed. Make sure that all residual soap is removed to prevent irritation.  Zicam Melts or Zinc lozenges ; vitamin C 2000 mg daily; & Echinacea for 4-7 days. Report fever, exudate("pus") or progressive pain.  Blood Pressure Goal  Ideally is an AVERAGE < 135/85. This AVERAGE should be calculated from @ least 5-7 BP readings taken @ different times of day on different days of week. You should not respond to isolated BP readings , but rather the AVERAGE for that week

## 2011-07-22 LAB — BASIC METABOLIC PANEL
BUN: 19 mg/dL (ref 6–23)
CO2: 28 mEq/L (ref 19–32)
Calcium: 9 mg/dL (ref 8.4–10.5)
Chloride: 106 mEq/L (ref 96–112)
Creat: 1.35 mg/dL (ref 0.50–1.35)
Glucose, Bld: 98 mg/dL (ref 70–99)
Potassium: 4.3 mEq/L (ref 3.5–5.3)
Sodium: 143 mEq/L (ref 135–145)

## 2011-07-25 ENCOUNTER — Telehealth: Payer: Self-pay

## 2011-07-25 NOTE — Telephone Encounter (Signed)
Spoke with patient's wife, verbalized understanding of results and indicated she will inform her husband

## 2011-07-25 NOTE — Telephone Encounter (Signed)
Message copied by Maurice Small on Tue Jul 25, 2011  4:45 PM ------      Message from: Pecola Lawless      Created: Sat Jul 22, 2011  7:44 AM       All chemistries  are excellent.[employer]

## 2011-07-31 DIAGNOSIS — M47817 Spondylosis without myelopathy or radiculopathy, lumbosacral region: Secondary | ICD-10-CM | POA: Diagnosis not present

## 2011-08-03 DIAGNOSIS — M5137 Other intervertebral disc degeneration, lumbosacral region: Secondary | ICD-10-CM | POA: Diagnosis not present

## 2011-08-10 DIAGNOSIS — H2510 Age-related nuclear cataract, unspecified eye: Secondary | ICD-10-CM | POA: Diagnosis not present

## 2011-08-11 DIAGNOSIS — M47817 Spondylosis without myelopathy or radiculopathy, lumbosacral region: Secondary | ICD-10-CM | POA: Diagnosis not present

## 2011-08-21 ENCOUNTER — Other Ambulatory Visit: Payer: Self-pay | Admitting: Internal Medicine

## 2011-08-21 ENCOUNTER — Encounter: Payer: Self-pay | Admitting: Internal Medicine

## 2011-08-21 DIAGNOSIS — I10 Essential (primary) hypertension: Secondary | ICD-10-CM | POA: Insufficient documentation

## 2011-08-21 MED ORDER — AMLODIPINE BESYLATE 5 MG PO TABS: 5.0000 mg | ORAL_TABLET | Freq: Every day | ORAL | Status: DC

## 2011-08-22 DIAGNOSIS — H6120 Impacted cerumen, unspecified ear: Secondary | ICD-10-CM | POA: Diagnosis not present

## 2011-08-22 DIAGNOSIS — H903 Sensorineural hearing loss, bilateral: Secondary | ICD-10-CM | POA: Diagnosis not present

## 2011-09-07 DIAGNOSIS — M47817 Spondylosis without myelopathy or radiculopathy, lumbosacral region: Secondary | ICD-10-CM | POA: Diagnosis not present

## 2011-09-07 DIAGNOSIS — M48061 Spinal stenosis, lumbar region without neurogenic claudication: Secondary | ICD-10-CM | POA: Diagnosis not present

## 2011-09-07 DIAGNOSIS — IMO0002 Reserved for concepts with insufficient information to code with codable children: Secondary | ICD-10-CM | POA: Diagnosis not present

## 2011-09-11 ENCOUNTER — Encounter: Payer: Self-pay | Admitting: Internal Medicine

## 2011-09-11 DIAGNOSIS — IMO0002 Reserved for concepts with insufficient information to code with codable children: Secondary | ICD-10-CM | POA: Diagnosis not present

## 2011-09-11 DIAGNOSIS — M5126 Other intervertebral disc displacement, lumbar region: Secondary | ICD-10-CM | POA: Diagnosis not present

## 2011-09-11 DIAGNOSIS — M48061 Spinal stenosis, lumbar region without neurogenic claudication: Secondary | ICD-10-CM | POA: Diagnosis not present

## 2011-09-20 DIAGNOSIS — K648 Other hemorrhoids: Secondary | ICD-10-CM | POA: Diagnosis not present

## 2011-09-28 ENCOUNTER — Encounter: Payer: Self-pay | Admitting: Internal Medicine

## 2011-10-03 DIAGNOSIS — H6120 Impacted cerumen, unspecified ear: Secondary | ICD-10-CM | POA: Diagnosis not present

## 2011-10-03 DIAGNOSIS — H903 Sensorineural hearing loss, bilateral: Secondary | ICD-10-CM | POA: Diagnosis not present

## 2011-10-04 ENCOUNTER — Encounter: Payer: Self-pay | Admitting: Internal Medicine

## 2011-10-04 DIAGNOSIS — K648 Other hemorrhoids: Secondary | ICD-10-CM | POA: Diagnosis not present

## 2011-10-05 ENCOUNTER — Encounter: Payer: Self-pay | Admitting: Internal Medicine

## 2011-10-31 ENCOUNTER — Encounter: Payer: Self-pay | Admitting: Internal Medicine

## 2011-10-31 ENCOUNTER — Other Ambulatory Visit: Payer: Self-pay

## 2011-10-31 DIAGNOSIS — I10 Essential (primary) hypertension: Secondary | ICD-10-CM

## 2011-10-31 MED ORDER — AMLODIPINE BESYLATE 5 MG PO TABS: 5.0000 mg | ORAL_TABLET | Freq: Every day | ORAL | Status: DC

## 2011-11-08 ENCOUNTER — Encounter: Payer: Self-pay | Admitting: Internal Medicine

## 2011-11-09 ENCOUNTER — Other Ambulatory Visit: Payer: Self-pay | Admitting: Internal Medicine

## 2011-11-09 DIAGNOSIS — D509 Iron deficiency anemia, unspecified: Secondary | ICD-10-CM

## 2011-11-20 ENCOUNTER — Encounter: Payer: Self-pay | Admitting: Internal Medicine

## 2011-11-20 DIAGNOSIS — H6120 Impacted cerumen, unspecified ear: Secondary | ICD-10-CM | POA: Diagnosis not present

## 2011-11-20 DIAGNOSIS — H903 Sensorineural hearing loss, bilateral: Secondary | ICD-10-CM | POA: Diagnosis not present

## 2011-11-22 ENCOUNTER — Other Ambulatory Visit (INDEPENDENT_AMBULATORY_CARE_PROVIDER_SITE_OTHER): Payer: Medicare Other

## 2011-11-22 DIAGNOSIS — D509 Iron deficiency anemia, unspecified: Secondary | ICD-10-CM

## 2011-11-22 LAB — CBC
HCT: 39.9 % (ref 39.0–52.0)
Hemoglobin: 13.5 g/dL (ref 13.0–17.0)
MCHC: 33.9 g/dL (ref 30.0–36.0)
MCV: 99.8 fl (ref 78.0–100.0)
Platelets: 147 10*3/uL — ABNORMAL LOW (ref 150.0–400.0)
RBC: 3.99 Mil/uL — ABNORMAL LOW (ref 4.22–5.81)
RDW: 12.8 % (ref 11.5–14.6)
WBC: 5.2 10*3/uL (ref 4.5–10.5)

## 2011-11-22 NOTE — Progress Notes (Signed)
Lab only 

## 2012-01-02 DIAGNOSIS — H903 Sensorineural hearing loss, bilateral: Secondary | ICD-10-CM | POA: Diagnosis not present

## 2012-01-02 DIAGNOSIS — H6120 Impacted cerumen, unspecified ear: Secondary | ICD-10-CM | POA: Diagnosis not present

## 2012-01-05 ENCOUNTER — Encounter: Payer: Self-pay | Admitting: Cardiology

## 2012-01-08 ENCOUNTER — Encounter: Payer: Self-pay | Admitting: Cardiology

## 2012-02-05 ENCOUNTER — Other Ambulatory Visit: Payer: Self-pay | Admitting: Internal Medicine

## 2012-02-06 NOTE — Telephone Encounter (Signed)
Patient needs to schedule Lipid/Hep 272.4/995.20 and a f/u with doctor 3-5 days later

## 2012-02-21 ENCOUNTER — Other Ambulatory Visit: Payer: Self-pay | Admitting: Internal Medicine

## 2012-02-21 NOTE — Telephone Encounter (Signed)
Refill done.  

## 2012-03-04 ENCOUNTER — Other Ambulatory Visit: Payer: Self-pay | Admitting: Internal Medicine

## 2012-03-04 DIAGNOSIS — H6120 Impacted cerumen, unspecified ear: Secondary | ICD-10-CM | POA: Diagnosis not present

## 2012-03-04 DIAGNOSIS — H903 Sensorineural hearing loss, bilateral: Secondary | ICD-10-CM | POA: Diagnosis not present

## 2012-03-04 NOTE — Telephone Encounter (Signed)
Lipid/Hep 272.4/995.20  

## 2012-03-05 ENCOUNTER — Encounter: Payer: Self-pay | Admitting: Internal Medicine

## 2012-03-05 ENCOUNTER — Encounter: Payer: Self-pay | Admitting: *Deleted

## 2012-03-05 DIAGNOSIS — E785 Hyperlipidemia, unspecified: Secondary | ICD-10-CM

## 2012-03-05 DIAGNOSIS — T887XXA Unspecified adverse effect of drug or medicament, initial encounter: Secondary | ICD-10-CM

## 2012-03-06 ENCOUNTER — Encounter: Payer: Self-pay | Admitting: *Deleted

## 2012-03-11 ENCOUNTER — Ambulatory Visit (INDEPENDENT_AMBULATORY_CARE_PROVIDER_SITE_OTHER): Payer: Medicare Other

## 2012-03-11 DIAGNOSIS — Z111 Encounter for screening for respiratory tuberculosis: Secondary | ICD-10-CM | POA: Diagnosis not present

## 2012-03-13 ENCOUNTER — Other Ambulatory Visit (INDEPENDENT_AMBULATORY_CARE_PROVIDER_SITE_OTHER): Payer: Medicare Other

## 2012-03-13 DIAGNOSIS — E785 Hyperlipidemia, unspecified: Secondary | ICD-10-CM | POA: Diagnosis not present

## 2012-03-13 DIAGNOSIS — T887XXA Unspecified adverse effect of drug or medicament, initial encounter: Secondary | ICD-10-CM

## 2012-03-13 DIAGNOSIS — D509 Iron deficiency anemia, unspecified: Secondary | ICD-10-CM | POA: Diagnosis not present

## 2012-03-13 LAB — CBC WITH DIFFERENTIAL/PLATELET
Basophils Absolute: 0 10*3/uL (ref 0.0–0.1)
Basophils Relative: 0.7 % (ref 0.0–3.0)
Eosinophils Absolute: 0.2 10*3/uL (ref 0.0–0.7)
Eosinophils Relative: 4.5 % (ref 0.0–5.0)
HCT: 40.9 % (ref 39.0–52.0)
Hemoglobin: 13.7 g/dL (ref 13.0–17.0)
Lymphocytes Relative: 24 % (ref 12.0–46.0)
Lymphs Abs: 1.2 10*3/uL (ref 0.7–4.0)
MCHC: 33.6 g/dL (ref 30.0–36.0)
MCV: 98.5 fl (ref 78.0–100.0)
Monocytes Absolute: 0.2 10*3/uL (ref 0.1–1.0)
Monocytes Relative: 4.6 % (ref 3.0–12.0)
Neutro Abs: 3.2 10*3/uL (ref 1.4–7.7)
Neutrophils Relative %: 66.2 % (ref 43.0–77.0)
Platelets: 157 10*3/uL (ref 150.0–400.0)
RBC: 4.15 Mil/uL — ABNORMAL LOW (ref 4.22–5.81)
RDW: 13.3 % (ref 11.5–14.6)
WBC: 4.8 10*3/uL (ref 4.5–10.5)

## 2012-03-13 LAB — LIPID PANEL
Cholesterol: 166 mg/dL (ref 0–200)
HDL: 41.8 mg/dL (ref >39.00)
LDL Cholesterol: 101 mg/dL — ABNORMAL HIGH (ref 0–99)
Total CHOL/HDL Ratio: 4
Triglycerides: 118 mg/dL (ref 0.0–149.0)
VLDL: 23.6 mg/dL (ref 0.0–40.0)

## 2012-03-13 LAB — HEPATIC FUNCTION PANEL
ALT: 29 U/L (ref 0–53)
AST: 26 U/L (ref 0–37)
Albumin: 4.1 g/dL (ref 3.5–5.2)
Alkaline Phosphatase: 40 U/L (ref 39–117)
Bilirubin, Direct: 0.1 mg/dL (ref 0.0–0.3)
Total Bilirubin: 0.9 mg/dL (ref 0.3–1.2)
Total Protein: 6.7 g/dL (ref 6.0–8.3)

## 2012-03-13 LAB — TB SKIN TEST
Induration: 0 mm
TB Skin Test: NEGATIVE

## 2012-03-19 ENCOUNTER — Ambulatory Visit (INDEPENDENT_AMBULATORY_CARE_PROVIDER_SITE_OTHER): Payer: Medicare Other | Admitting: Internal Medicine

## 2012-03-19 ENCOUNTER — Encounter: Payer: Self-pay | Admitting: Internal Medicine

## 2012-03-19 VITALS — BP 120/72 | HR 69 | Wt 190.2 lb

## 2012-03-19 DIAGNOSIS — I10 Essential (primary) hypertension: Secondary | ICD-10-CM | POA: Diagnosis not present

## 2012-03-19 DIAGNOSIS — R030 Elevated blood-pressure reading, without diagnosis of hypertension: Secondary | ICD-10-CM

## 2012-03-19 DIAGNOSIS — Z23 Encounter for immunization: Secondary | ICD-10-CM | POA: Diagnosis not present

## 2012-03-19 DIAGNOSIS — D696 Thrombocytopenia, unspecified: Secondary | ICD-10-CM

## 2012-03-19 DIAGNOSIS — E785 Hyperlipidemia, unspecified: Secondary | ICD-10-CM

## 2012-03-19 MED ORDER — AMLODIPINE BESYLATE 5 MG PO TABS: 10.0000 mg | ORAL_TABLET | Freq: Every day | ORAL | Status: DC

## 2012-03-19 MED ORDER — LOSARTAN POTASSIUM 100 MG PO TABS: ORAL_TABLET | ORAL | Status: DC

## 2012-03-19 MED ORDER — ROSUVASTATIN CALCIUM 20 MG PO TABS: ORAL_TABLET | ORAL | Status: DC

## 2012-03-19 NOTE — Assessment & Plan Note (Addendum)
Because of intermittent slightly decreased platelet count, avoid excess aspirin , ibuprofen, naproxen etc .He is to repeat platelet count if  any abnormal bruising or bleeding occur.

## 2012-03-19 NOTE — Assessment & Plan Note (Addendum)
Lipids are at goal; no change indicated 

## 2012-03-19 NOTE — Progress Notes (Signed)
  Subjective:    Patient ID: James Noble, male    DOB: [birth year], 71 y.o.   MRN: [id number]  HPI HYPERTENSION: Disease Monitoring: Blood pressure range-average 130/82  Medications: Compliance- yes CVE: spin class, floor work  HYPERLIPIDEMIA: Disease Monitoring: See symptoms for Hypertension Medications: Compliance- yes Diet: gluten free    Thrombocytopenia: Platelet count now 157,000; 147,000 three mos ago. Platelet count has varied from 146,000 in 2010 to a high of 172,000 in 2011. He denies any bleeding dyscrasias. He has no hematuria, melena, or rectal bleeding    Review of Systems  Chest pain, palpitations-no      Dyspnea-no Lightheadedness,Syncope-lightheaded after heavy workout for a few minutes Edema- yes with Amlodipine after prolonged sitting Abd pain, bowel changes- no  Muscle aches- no     Objective:   Physical Exam He appears healthy and well-nourished; he is in no acute distress  No carotid bruits are present. No neck vein distention at 0  Heart rhythm and rate are normal with no significant murmurs or gallops.  Chest is clear with no increased work of breathing  There is no evidence of aortic aneurysm or renal artery bruits  He has no clubbing or edema.  Abdomen reveals no organomegaly or masses. There is no hepatojugular reflux   Pedal pulses are intact   No ischemic skin changes are present   Has no lymphadenopathy about the head, neck or axilla        Assessment & Plan:

## 2012-03-19 NOTE — Addendum Note (Signed)
Addended by: Edison Simon C on: 03/19/2012 09:00 AM   Modules accepted: Orders

## 2012-03-19 NOTE — Assessment & Plan Note (Addendum)
Blood pressure control is excellent; he does have edema after prolonged sitting. If this is not controlled by support hose; amlodipine will be changed.  He does have some lightheadedness after vigorous exercise. Rest and isometrics the lower extremities will be recommended as prevention

## 2012-03-19 NOTE — Patient Instructions (Addendum)
Perform the isometric exercises discussed 4- 5 times prior to standing after exercise. Blood Pressure Goal  Ideally is an AVERAGE < 135/85. This AVERAGE should be calculated from @ least 5-7 BP readings taken @ different times of day on different days of week. You should not respond to isolated BP readings , but rather the AVERAGE for that week.  As discussed report any abnormal bruising or bleeding. Have a complete blood count performed immediately.  If the edema is persistent; amlodipine should be changed to a different drug in the CCB class.

## 2012-03-26 DIAGNOSIS — IMO0002 Reserved for concepts with insufficient information to code with codable children: Secondary | ICD-10-CM | POA: Diagnosis not present

## 2012-04-08 ENCOUNTER — Other Ambulatory Visit: Payer: Self-pay | Admitting: Internal Medicine

## 2012-05-06 DIAGNOSIS — H6120 Impacted cerumen, unspecified ear: Secondary | ICD-10-CM | POA: Diagnosis not present

## 2012-05-06 DIAGNOSIS — H903 Sensorineural hearing loss, bilateral: Secondary | ICD-10-CM | POA: Diagnosis not present

## 2012-06-03 DIAGNOSIS — M25519 Pain in unspecified shoulder: Secondary | ICD-10-CM | POA: Diagnosis not present

## 2012-06-03 DIAGNOSIS — M48061 Spinal stenosis, lumbar region without neurogenic claudication: Secondary | ICD-10-CM | POA: Diagnosis not present

## 2012-06-03 DIAGNOSIS — M5126 Other intervertebral disc displacement, lumbar region: Secondary | ICD-10-CM | POA: Diagnosis not present

## 2012-06-15 ENCOUNTER — Other Ambulatory Visit: Payer: Self-pay

## 2012-06-15 ENCOUNTER — Encounter: Payer: Self-pay | Admitting: Internal Medicine

## 2012-07-01 DIAGNOSIS — H903 Sensorineural hearing loss, bilateral: Secondary | ICD-10-CM | POA: Diagnosis not present

## 2012-07-01 DIAGNOSIS — H6120 Impacted cerumen, unspecified ear: Secondary | ICD-10-CM | POA: Diagnosis not present

## 2012-07-08 DIAGNOSIS — IMO0002 Reserved for concepts with insufficient information to code with codable children: Secondary | ICD-10-CM | POA: Diagnosis not present

## 2012-07-08 DIAGNOSIS — M47817 Spondylosis without myelopathy or radiculopathy, lumbosacral region: Secondary | ICD-10-CM | POA: Diagnosis not present

## 2012-07-08 DIAGNOSIS — M48061 Spinal stenosis, lumbar region without neurogenic claudication: Secondary | ICD-10-CM | POA: Diagnosis not present

## 2012-08-26 DIAGNOSIS — H6120 Impacted cerumen, unspecified ear: Secondary | ICD-10-CM | POA: Diagnosis not present

## 2012-08-26 DIAGNOSIS — H903 Sensorineural hearing loss, bilateral: Secondary | ICD-10-CM | POA: Diagnosis not present

## 2012-09-09 DIAGNOSIS — H2510 Age-related nuclear cataract, unspecified eye: Secondary | ICD-10-CM | POA: Diagnosis not present

## 2012-10-17 DIAGNOSIS — C44319 Basal cell carcinoma of skin of other parts of face: Secondary | ICD-10-CM | POA: Diagnosis not present

## 2012-10-17 DIAGNOSIS — L819 Disorder of pigmentation, unspecified: Secondary | ICD-10-CM | POA: Diagnosis not present

## 2012-10-17 DIAGNOSIS — D485 Neoplasm of uncertain behavior of skin: Secondary | ICD-10-CM | POA: Diagnosis not present

## 2012-10-17 DIAGNOSIS — L821 Other seborrheic keratosis: Secondary | ICD-10-CM | POA: Diagnosis not present

## 2012-10-17 DIAGNOSIS — D1801 Hemangioma of skin and subcutaneous tissue: Secondary | ICD-10-CM | POA: Diagnosis not present

## 2012-10-17 DIAGNOSIS — Z85828 Personal history of other malignant neoplasm of skin: Secondary | ICD-10-CM | POA: Diagnosis not present

## 2012-10-21 DIAGNOSIS — H903 Sensorineural hearing loss, bilateral: Secondary | ICD-10-CM | POA: Diagnosis not present

## 2012-10-21 DIAGNOSIS — H6120 Impacted cerumen, unspecified ear: Secondary | ICD-10-CM | POA: Diagnosis not present

## 2012-10-30 ENCOUNTER — Encounter: Payer: Self-pay | Admitting: Internal Medicine

## 2012-11-27 DIAGNOSIS — H6120 Impacted cerumen, unspecified ear: Secondary | ICD-10-CM | POA: Diagnosis not present

## 2012-11-27 DIAGNOSIS — H903 Sensorineural hearing loss, bilateral: Secondary | ICD-10-CM | POA: Diagnosis not present

## 2012-11-27 DIAGNOSIS — H60339 Swimmer's ear, unspecified ear: Secondary | ICD-10-CM | POA: Diagnosis not present

## 2012-12-02 ENCOUNTER — Ambulatory Visit (INDEPENDENT_AMBULATORY_CARE_PROVIDER_SITE_OTHER): Payer: Medicare Other | Admitting: Internal Medicine

## 2012-12-02 ENCOUNTER — Encounter: Payer: Self-pay | Admitting: Internal Medicine

## 2012-12-02 VITALS — BP 130/74 | HR 59 | Temp 98.1°F | Wt 193.0 lb

## 2012-12-02 DIAGNOSIS — I10 Essential (primary) hypertension: Secondary | ICD-10-CM

## 2012-12-02 LAB — BASIC METABOLIC PANEL
BUN: 15 mg/dL (ref 6–23)
CO2: 28 mEq/L (ref 19–32)
Calcium: 9.4 mg/dL (ref 8.4–10.5)
Chloride: 105 mEq/L (ref 96–112)
Creatinine, Ser: 0.9 mg/dL (ref 0.4–1.5)
GFR: 90.85 mL/min (ref >60.00)
Glucose, Bld: 89 mg/dL (ref 70–99)
Potassium: 4.2 mEq/L (ref 3.5–5.1)
Sodium: 141 mEq/L (ref 135–145)

## 2012-12-02 NOTE — Patient Instructions (Addendum)
Minimal Blood Pressure Goal= AVERAGE < 140/90;  Ideal is an AVERAGE < 135/85. This AVERAGE should be calculated from @ least 5-7 BP readings taken @ different times of day on different days of week. You should not respond to isolated BP readings , but rather the AVERAGE for that week .Please bring your  blood pressure cuff to office visits to verify that it is reliable.It  can also be checked against the blood pressure device at the pharmacy.  Repeat the isometric exercises discussed 4- 5 times prior to standing if you're having postural symptoms.

## 2012-12-02 NOTE — Progress Notes (Signed)
  Subjective:    Patient ID: James Noble, male    DOB: Jan 17, 1941, 72 y.o.   MRN: [id number]  HPI   He has noted some lightheadedness/dizziness with high level cardiovascular exercise in ams. He is not monitoring  his blood pressure regularly. He does take amlodipine 5 mg 2 pills and will start him 100 mg in the evening.    Review of Systems  He is on a heart healthy diet; he exercises  60 minutes 6 times per week without other symptoms. Specifically he denies chest pain, palpitations, dyspnea, or claudication. Family history is negative for premature coronary disease; but his father had MI @ 37. Advanced cholesterol testing reveals his LDL goal is less than 100.    Objective:   Physical Exam   Appears healthy and well-nourished & in no acute distress  No carotid bruits are present.No neck pain distention present at 10 - 15 degrees. Thyroid normal to palpation  Heart rhythm and rate are normal ; apical short grade 1systolic murmur  Chest is clear with no increased work of breathing  There is no evidence of aortic aneurysm or renal artery bruits  Abdomen soft with no organomegaly or masses. No HJR  No clubbing, cyanosis or edema present.  Pedal pulses are intact   No ischemic skin changes are present . Nails healthy   Alert and oriented. Strength, tone normal        Assessment & Plan:  #1 some dizziness with high levels of cardiac arrest exercise in the context of taking all his antihypertensive medications in the evening. He'll be asked to take the losartan at bedtime and the amlodipine an hour after exercise. If symptoms persist; the losartan would be switch to after exercise as well. If he has any postural lightheadedness; isometric exercises recommended.

## 2012-12-05 DIAGNOSIS — Z85828 Personal history of other malignant neoplasm of skin: Secondary | ICD-10-CM | POA: Diagnosis not present

## 2012-12-05 DIAGNOSIS — C44319 Basal cell carcinoma of skin of other parts of face: Secondary | ICD-10-CM | POA: Diagnosis not present

## 2012-12-18 DIAGNOSIS — H903 Sensorineural hearing loss, bilateral: Secondary | ICD-10-CM | POA: Diagnosis not present

## 2012-12-18 DIAGNOSIS — H6120 Impacted cerumen, unspecified ear: Secondary | ICD-10-CM | POA: Diagnosis not present

## 2013-01-30 DIAGNOSIS — Z23 Encounter for immunization: Secondary | ICD-10-CM | POA: Diagnosis not present

## 2013-02-03 DIAGNOSIS — H903 Sensorineural hearing loss, bilateral: Secondary | ICD-10-CM | POA: Diagnosis not present

## 2013-02-03 DIAGNOSIS — H6120 Impacted cerumen, unspecified ear: Secondary | ICD-10-CM | POA: Diagnosis not present

## 2013-03-06 ENCOUNTER — Other Ambulatory Visit: Payer: Self-pay

## 2013-03-31 DIAGNOSIS — H903 Sensorineural hearing loss, bilateral: Secondary | ICD-10-CM | POA: Diagnosis not present

## 2013-03-31 DIAGNOSIS — H6120 Impacted cerumen, unspecified ear: Secondary | ICD-10-CM | POA: Diagnosis not present

## 2013-04-01 ENCOUNTER — Other Ambulatory Visit: Payer: Self-pay | Admitting: *Deleted

## 2013-04-01 ENCOUNTER — Encounter: Payer: Self-pay | Admitting: Internal Medicine

## 2013-04-01 MED ORDER — AMLODIPINE BESYLATE 5 MG PO TABS: 10.0000 mg | ORAL_TABLET | Freq: Every day | ORAL | Status: DC

## 2013-04-01 MED ORDER — ROSUVASTATIN CALCIUM 20 MG PO TABS: ORAL_TABLET | ORAL | Status: DC

## 2013-04-01 MED ORDER — LOSARTAN POTASSIUM 100 MG PO TABS: 100.0000 mg | ORAL_TABLET | Freq: Every day | ORAL | Status: DC

## 2013-05-09 ENCOUNTER — Other Ambulatory Visit: Payer: Self-pay | Admitting: Internal Medicine

## 2013-05-12 NOTE — Telephone Encounter (Signed)
Gabapentin refilled per protocol. JG//CMA 

## 2013-05-19 ENCOUNTER — Encounter: Payer: Self-pay | Admitting: Internal Medicine

## 2013-05-20 ENCOUNTER — Encounter: Payer: Self-pay | Admitting: Internal Medicine

## 2013-06-09 DIAGNOSIS — H903 Sensorineural hearing loss, bilateral: Secondary | ICD-10-CM | POA: Diagnosis not present

## 2013-06-09 DIAGNOSIS — H6120 Impacted cerumen, unspecified ear: Secondary | ICD-10-CM | POA: Diagnosis not present

## 2013-06-12 DIAGNOSIS — Z111 Encounter for screening for respiratory tuberculosis: Secondary | ICD-10-CM | POA: Diagnosis not present

## 2013-06-30 DIAGNOSIS — D485 Neoplasm of uncertain behavior of skin: Secondary | ICD-10-CM | POA: Diagnosis not present

## 2013-06-30 DIAGNOSIS — Z09 Encounter for follow-up examination after completed treatment for conditions other than malignant neoplasm: Secondary | ICD-10-CM | POA: Diagnosis not present

## 2013-06-30 DIAGNOSIS — L821 Other seborrheic keratosis: Secondary | ICD-10-CM | POA: Diagnosis not present

## 2013-07-11 DIAGNOSIS — D2370 Other benign neoplasm of skin of unspecified lower limb, including hip: Secondary | ICD-10-CM | POA: Diagnosis not present

## 2013-07-11 DIAGNOSIS — L739 Follicular disorder, unspecified: Secondary | ICD-10-CM | POA: Diagnosis not present

## 2013-07-11 DIAGNOSIS — D239 Other benign neoplasm of skin, unspecified: Secondary | ICD-10-CM | POA: Diagnosis not present

## 2013-07-11 DIAGNOSIS — D1801 Hemangioma of skin and subcutaneous tissue: Secondary | ICD-10-CM | POA: Diagnosis not present

## 2013-07-11 DIAGNOSIS — L821 Other seborrheic keratosis: Secondary | ICD-10-CM | POA: Diagnosis not present

## 2013-07-11 DIAGNOSIS — Z85828 Personal history of other malignant neoplasm of skin: Secondary | ICD-10-CM | POA: Diagnosis not present

## 2013-08-18 DIAGNOSIS — H903 Sensorineural hearing loss, bilateral: Secondary | ICD-10-CM | POA: Diagnosis not present

## 2013-08-18 DIAGNOSIS — H6120 Impacted cerumen, unspecified ear: Secondary | ICD-10-CM | POA: Diagnosis not present

## 2013-09-02 ENCOUNTER — Ambulatory Visit (INDEPENDENT_AMBULATORY_CARE_PROVIDER_SITE_OTHER): Payer: Medicare Other | Admitting: Internal Medicine

## 2013-09-02 ENCOUNTER — Other Ambulatory Visit (INDEPENDENT_AMBULATORY_CARE_PROVIDER_SITE_OTHER): Payer: Medicare Other

## 2013-09-02 ENCOUNTER — Encounter: Payer: Self-pay | Admitting: Internal Medicine

## 2013-09-02 VITALS — BP 144/84 | HR 61 | Temp 97.8°F | Resp 13 | Ht 71.5 in | Wt 192.2 lb

## 2013-09-02 DIAGNOSIS — Z86010 Personal history of colonic polyps: Secondary | ICD-10-CM

## 2013-09-02 DIAGNOSIS — E785 Hyperlipidemia, unspecified: Secondary | ICD-10-CM

## 2013-09-02 DIAGNOSIS — I10 Essential (primary) hypertension: Secondary | ICD-10-CM | POA: Diagnosis not present

## 2013-09-02 DIAGNOSIS — N429 Disorder of prostate, unspecified: Secondary | ICD-10-CM

## 2013-09-02 LAB — HEPATIC FUNCTION PANEL
ALT: 24 U/L (ref 0–53)
AST: 26 U/L (ref 0–37)
Albumin: 4.3 g/dL (ref 3.5–5.2)
Alkaline Phosphatase: 45 U/L (ref 39–117)
Bilirubin, Direct: 0.1 mg/dL (ref 0.0–0.3)
Total Bilirubin: 0.9 mg/dL (ref 0.2–1.2)
Total Protein: 6.5 g/dL (ref 6.0–8.3)

## 2013-09-02 LAB — CBC WITH DIFFERENTIAL/PLATELET
Basophils Absolute: 0 10*3/uL (ref 0.0–0.1)
Basophils Relative: 0.5 % (ref 0.0–3.0)
Eosinophils Absolute: 0.3 10*3/uL (ref 0.0–0.7)
Eosinophils Relative: 4 % (ref 0.0–5.0)
HCT: 41.8 % (ref 39.0–52.0)
Hemoglobin: 14 g/dL (ref 13.0–17.0)
Lymphocytes Relative: 25 % (ref 12.0–46.0)
Lymphs Abs: 1.6 10*3/uL (ref 0.7–4.0)
MCHC: 33.5 g/dL (ref 30.0–36.0)
MCV: 98 fl (ref 78.0–100.0)
Monocytes Absolute: 0.4 10*3/uL (ref 0.1–1.0)
Monocytes Relative: 6.1 % (ref 3.0–12.0)
Neutro Abs: 4.1 10*3/uL (ref 1.4–7.7)
Neutrophils Relative %: 64.4 % (ref 43.0–77.0)
Platelets: 175 10*3/uL (ref 150.0–400.0)
RBC: 4.27 Mil/uL (ref 4.22–5.81)
RDW: 13.6 % (ref 11.5–15.5)
WBC: 6.4 10*3/uL (ref 4.0–10.5)

## 2013-09-02 LAB — BASIC METABOLIC PANEL
BUN: 17 mg/dL (ref 6–23)
CO2: 27 mEq/L (ref 19–32)
Calcium: 9.3 mg/dL (ref 8.4–10.5)
Chloride: 107 mEq/L (ref 96–112)
Creatinine, Ser: 0.9 mg/dL (ref 0.4–1.5)
GFR: 84.01 mL/min (ref >60.00)
Glucose, Bld: 96 mg/dL (ref 70–99)
Potassium: 4.3 mEq/L (ref 3.5–5.1)
Sodium: 143 mEq/L (ref 135–145)

## 2013-09-02 LAB — LIPID PANEL
Cholesterol: 157 mg/dL (ref 0–200)
HDL: 45.5 mg/dL (ref >39.00)
LDL Cholesterol: 99 mg/dL (ref 0–99)
Total CHOL/HDL Ratio: 3
Triglycerides: 62 mg/dL (ref 0.0–149.0)
VLDL: 12.4 mg/dL (ref 0.0–40.0)

## 2013-09-02 LAB — TSH: TSH: 1.53 u[IU]/mL (ref 0.35–4.50)

## 2013-09-02 LAB — PSA: PSA: 1.21 ng/mL (ref 0.10–4.00)

## 2013-09-02 NOTE — Assessment & Plan Note (Signed)
Blood pressure goals reviewed. BMET 

## 2013-09-02 NOTE — Assessment & Plan Note (Signed)
PSA

## 2013-09-02 NOTE — Assessment & Plan Note (Signed)
Lipids, LFTs, TSH  

## 2013-09-02 NOTE — Progress Notes (Signed)
Pre visit review using our clinic review tool, if applicable. No additional management support is needed unless otherwise documented below in the visit note. 

## 2013-09-02 NOTE — Patient Instructions (Signed)

## 2013-09-02 NOTE — Assessment & Plan Note (Signed)
CBC & dif 

## 2013-09-02 NOTE — Progress Notes (Signed)
   Subjective:    Patient ID: James Noble, male    DOB: [birth year], 73 y.o.   MRN: [id number]  HPI Dr Mervin Kung is here to assess active health issues & conditions. PMH, FH, & Social history verified & updated   A heart healthy diet is followed; exercise encompasses 60-90 minutes 4-6 times per week as CVE/weights/personal training without symptoms.   Advanced cholesterol testing reveals  LDL goal is less than 100 ; ideally < 70 . There is medication compliance with the statin.  Low dose ASA taken   Review of Systems Specifically denied are  chest pain, palpitations, dyspnea, or claudication.  Significant abdominal symptoms, memory deficit, or myalgias not present. BP averages 130/80 @ home.     Objective:   Physical Exam Gen.: Healthy and well-nourished in appearance. Alert, appropriate and cooperative throughout exam. Appears younger than stated age  Head: Normocephalic without obvious abnormalities; no alopecia  Eyes: No corneal or conjunctival inflammation noted. Pupils equal round reactive to light and accommodation. Extraocular motion intact.  Ears: External  ear exam reveals no significant lesions or deformities. Canals clear .TMs normal. Hearing aids  bilaterally. Nose: External nasal exam reveals no deformity or inflammation. Nasal mucosa are pink and moist. No lesions or exudates noted.   Mouth: Oral mucosa and oropharynx reveal no lesions or exudates. Teeth in good repair. Neck: No deformities, masses, or tenderness noted. Range of motion  & Thyroid normal. Lungs: Normal respiratory effort; chest expands symmetrically. Lungs are clear to auscultation without rales, wheezes, or increased work of breathing. Heart: Normal rate and rhythm. Normal S1 and S2. No gallop, click, or rub. No murmur. Abdomen: Bowel sounds normal; abdomen soft and nontender. No masses, organomegaly or hernias noted. Genitalia: Genitalia normal except for left varices & vasectomy scar tissue. Prostate is  symmetrically enlargement w/o asymmetry, nodularity, or induration                                 Musculoskeletal/extremities: No deformity or scoliosis noted of  the thoracic or lumbar spine. . No clubbing, cyanosis, edema, or significant extremity  deformity noted. Range of motion normal .Tone & strength normal. Hand joints normal Fingernail  health good. Able to lie down & sit up w/o help. Negative SLR bilaterally Vascular: Carotid, radial artery, dorsalis pedis and  posterior tibial pulses are full and equal. No bruits present. Neurologic: Alert and oriented x3. Deep tendon reflexes symmetrical and normal.  Gait normal  Skin: Intact without suspicious lesions or rashes. Lymph: No cervical, axillary, or inguinal lymphadenopathy present. Psych: Mood and affect are normal. Normally interactive                                                                                        Assessment & Plan:  See Current Assessment & Plan in Problem List under specific DiagnosisThe labs will be reviewed and risks and options assessed. Written recommendations will be provided by mail or directly through My Chart.Further evaluation or change in medical therapy will be directed by those results.

## 2013-09-03 ENCOUNTER — Telehealth: Payer: Self-pay | Admitting: Internal Medicine

## 2013-09-03 NOTE — Telephone Encounter (Signed)
Relevant patient education assigned to patient using Emmi. ° °

## 2013-10-20 DIAGNOSIS — H903 Sensorineural hearing loss, bilateral: Secondary | ICD-10-CM | POA: Diagnosis not present

## 2013-10-20 DIAGNOSIS — H6120 Impacted cerumen, unspecified ear: Secondary | ICD-10-CM | POA: Diagnosis not present

## 2013-11-03 ENCOUNTER — Other Ambulatory Visit: Payer: Self-pay | Admitting: Internal Medicine

## 2013-11-27 DIAGNOSIS — H2510 Age-related nuclear cataract, unspecified eye: Secondary | ICD-10-CM | POA: Diagnosis not present

## 2013-12-15 DIAGNOSIS — H903 Sensorineural hearing loss, bilateral: Secondary | ICD-10-CM | POA: Diagnosis not present

## 2013-12-15 DIAGNOSIS — H6120 Impacted cerumen, unspecified ear: Secondary | ICD-10-CM | POA: Diagnosis not present

## 2014-01-28 DIAGNOSIS — M25569 Pain in unspecified knee: Secondary | ICD-10-CM | POA: Diagnosis not present

## 2014-01-28 DIAGNOSIS — M2240 Chondromalacia patellae, unspecified knee: Secondary | ICD-10-CM | POA: Diagnosis not present

## 2014-02-02 DIAGNOSIS — K219 Gastro-esophageal reflux disease without esophagitis: Secondary | ICD-10-CM | POA: Diagnosis not present

## 2014-02-05 DIAGNOSIS — H6123 Impacted cerumen, bilateral: Secondary | ICD-10-CM | POA: Diagnosis not present

## 2014-02-05 DIAGNOSIS — H903 Sensorineural hearing loss, bilateral: Secondary | ICD-10-CM | POA: Diagnosis not present

## 2014-02-18 DIAGNOSIS — R918 Other nonspecific abnormal finding of lung field: Secondary | ICD-10-CM | POA: Diagnosis not present

## 2014-02-18 DIAGNOSIS — R42 Dizziness and giddiness: Secondary | ICD-10-CM | POA: Diagnosis not present

## 2014-02-18 DIAGNOSIS — R938 Abnormal findings on diagnostic imaging of other specified body structures: Secondary | ICD-10-CM | POA: Diagnosis not present

## 2014-02-18 DIAGNOSIS — I10 Essential (primary) hypertension: Secondary | ICD-10-CM | POA: Diagnosis not present

## 2014-02-18 DIAGNOSIS — Z87891 Personal history of nicotine dependence: Secondary | ICD-10-CM | POA: Diagnosis not present

## 2014-02-18 DIAGNOSIS — R197 Diarrhea, unspecified: Secondary | ICD-10-CM | POA: Diagnosis not present

## 2014-02-18 DIAGNOSIS — R531 Weakness: Secondary | ICD-10-CM | POA: Diagnosis not present

## 2014-02-18 DIAGNOSIS — R110 Nausea: Secondary | ICD-10-CM | POA: Diagnosis not present

## 2014-02-18 DIAGNOSIS — E785 Hyperlipidemia, unspecified: Secondary | ICD-10-CM | POA: Diagnosis not present

## 2014-02-18 DIAGNOSIS — I252 Old myocardial infarction: Secondary | ICD-10-CM | POA: Diagnosis not present

## 2014-02-22 ENCOUNTER — Encounter: Payer: Self-pay | Admitting: Internal Medicine

## 2014-02-23 ENCOUNTER — Encounter: Payer: Self-pay | Admitting: Internal Medicine

## 2014-03-02 ENCOUNTER — Encounter: Payer: Self-pay | Admitting: Internal Medicine

## 2014-03-03 ENCOUNTER — Telehealth: Payer: Self-pay | Admitting: Internal Medicine

## 2014-03-03 NOTE — Telephone Encounter (Signed)
Received 20 pages from [facility], sent to Dr. Linna Darner. 03/03/14/ss

## 2014-03-06 ENCOUNTER — Other Ambulatory Visit: Payer: Self-pay | Admitting: Internal Medicine

## 2014-03-06 DIAGNOSIS — R790 Abnormal level of blood mineral: Secondary | ICD-10-CM

## 2014-03-06 DIAGNOSIS — R7989 Other specified abnormal findings of blood chemistry: Secondary | ICD-10-CM

## 2014-03-06 DIAGNOSIS — D72829 Elevated white blood cell count, unspecified: Secondary | ICD-10-CM

## 2014-03-06 DIAGNOSIS — R918 Other nonspecific abnormal finding of lung field: Secondary | ICD-10-CM

## 2014-03-09 NOTE — Progress Notes (Signed)
Left a message for patient that per Dr Linna Darner he is to have repeat labs and xray based on his ER visit from 10/21 and then a follow up office visit with Dr Linna Darner.

## 2014-03-12 ENCOUNTER — Ambulatory Visit (INDEPENDENT_AMBULATORY_CARE_PROVIDER_SITE_OTHER)
Admission: RE | Admit: 2014-03-12 | Discharge: 2014-03-12 | Disposition: A | Discharge status: Home / Self Care | Payer: Medicare Other | Source: Ambulatory Visit | Attending: Internal Medicine | Admitting: Internal Medicine

## 2014-03-12 ENCOUNTER — Other Ambulatory Visit (INDEPENDENT_AMBULATORY_CARE_PROVIDER_SITE_OTHER): Payer: Medicare Other

## 2014-03-12 DIAGNOSIS — R7989 Other specified abnormal findings of blood chemistry: Secondary | ICD-10-CM

## 2014-03-12 DIAGNOSIS — R748 Abnormal levels of other serum enzymes: Secondary | ICD-10-CM

## 2014-03-12 DIAGNOSIS — D72829 Elevated white blood cell count, unspecified: Secondary | ICD-10-CM | POA: Diagnosis not present

## 2014-03-12 DIAGNOSIS — R790 Abnormal level of blood mineral: Secondary | ICD-10-CM

## 2014-03-12 DIAGNOSIS — M2578 Osteophyte, vertebrae: Secondary | ICD-10-CM | POA: Diagnosis not present

## 2014-03-12 DIAGNOSIS — R918 Other nonspecific abnormal finding of lung field: Secondary | ICD-10-CM

## 2014-03-12 LAB — BASIC METABOLIC PANEL
BUN: 13 mg/dL (ref 6–23)
CO2: 25 mEq/L (ref 19–32)
Calcium: 9.2 mg/dL (ref 8.4–10.5)
Chloride: 106 mEq/L (ref 96–112)
Creatinine, Ser: 1.2 mg/dL (ref 0.4–1.5)
GFR: 66.47 mL/min (ref >60.00)
Glucose, Bld: 103 mg/dL — ABNORMAL HIGH (ref 70–99)
Potassium: 4.1 mEq/L (ref 3.5–5.1)
Sodium: 141 mEq/L (ref 135–145)

## 2014-03-12 LAB — CBC WITH DIFFERENTIAL/PLATELET
Basophils Absolute: 0 10*3/uL (ref 0.0–0.1)
Basophils Relative: 0.4 % (ref 0.0–3.0)
Eosinophils Absolute: 0.5 10*3/uL (ref 0.0–0.7)
Eosinophils Relative: 6.1 % — ABNORMAL HIGH (ref 0.0–5.0)
HCT: 41.2 % (ref 39.0–52.0)
Hemoglobin: 13.6 g/dL (ref 13.0–17.0)
Lymphocytes Relative: 21.8 % (ref 12.0–46.0)
Lymphs Abs: 1.7 10*3/uL (ref 0.7–4.0)
MCHC: 33.1 g/dL (ref 30.0–36.0)
MCV: 95.9 fl (ref 78.0–100.0)
Monocytes Absolute: 0.4 10*3/uL (ref 0.1–1.0)
Monocytes Relative: 5.4 % (ref 3.0–12.0)
Neutro Abs: 5.1 10*3/uL (ref 1.4–7.7)
Neutrophils Relative %: 66.3 % (ref 43.0–77.0)
Platelets: 222 10*3/uL (ref 150.0–400.0)
RBC: 4.3 Mil/uL (ref 4.22–5.81)
RDW: 13.5 % (ref 11.5–15.5)
WBC: 7.8 10*3/uL (ref 4.0–10.5)

## 2014-03-12 LAB — MAGNESIUM: Magnesium: 1.6 mg/dL (ref 1.5–2.5)

## 2014-03-13 ENCOUNTER — Telehealth: Payer: Self-pay | Admitting: *Deleted

## 2014-03-13 ENCOUNTER — Other Ambulatory Visit (INDEPENDENT_AMBULATORY_CARE_PROVIDER_SITE_OTHER): Payer: Medicare Other

## 2014-03-13 DIAGNOSIS — R739 Hyperglycemia, unspecified: Secondary | ICD-10-CM | POA: Diagnosis not present

## 2014-03-13 LAB — HEMOGLOBIN A1C: Hgb A1c MFr Bld: 5.7 % (ref 4.6–6.5)

## 2014-03-13 NOTE — Telephone Encounter (Signed)
Faxed add-on slip to lab...Johny Chess

## 2014-03-13 NOTE — Telephone Encounter (Signed)
-----   Message from Hendricks Limes, MD sent at 03/13/2014  7:19 AM EST ----- Please add A1c (R73.9)

## 2014-04-07 ENCOUNTER — Other Ambulatory Visit: Payer: Self-pay | Admitting: Internal Medicine

## 2014-04-07 DIAGNOSIS — H903 Sensorineural hearing loss, bilateral: Secondary | ICD-10-CM | POA: Diagnosis not present

## 2014-04-07 DIAGNOSIS — H6123 Impacted cerumen, bilateral: Secondary | ICD-10-CM | POA: Diagnosis not present

## 2014-04-13 DIAGNOSIS — K219 Gastro-esophageal reflux disease without esophagitis: Secondary | ICD-10-CM | POA: Diagnosis not present

## 2014-04-14 ENCOUNTER — Ambulatory Visit (INDEPENDENT_AMBULATORY_CARE_PROVIDER_SITE_OTHER): Payer: Medicare Other

## 2014-04-14 DIAGNOSIS — Z23 Encounter for immunization: Secondary | ICD-10-CM | POA: Diagnosis not present

## 2014-04-16 LAB — TB SKIN TEST
Induration: 0 mm
TB Skin Test: NEGATIVE

## 2014-05-25 DIAGNOSIS — K219 Gastro-esophageal reflux disease without esophagitis: Secondary | ICD-10-CM | POA: Diagnosis not present

## 2014-06-02 DIAGNOSIS — H903 Sensorineural hearing loss, bilateral: Secondary | ICD-10-CM | POA: Diagnosis not present

## 2014-06-02 DIAGNOSIS — H6123 Impacted cerumen, bilateral: Secondary | ICD-10-CM | POA: Diagnosis not present

## 2014-06-15 ENCOUNTER — Other Ambulatory Visit: Payer: Self-pay | Admitting: Internal Medicine

## 2014-06-15 NOTE — Telephone Encounter (Signed)
#  90,R X 1 

## 2014-06-18 DIAGNOSIS — Z85828 Personal history of other malignant neoplasm of skin: Secondary | ICD-10-CM | POA: Diagnosis not present

## 2014-06-18 DIAGNOSIS — L821 Other seborrheic keratosis: Secondary | ICD-10-CM | POA: Diagnosis not present

## 2014-07-02 ENCOUNTER — Other Ambulatory Visit: Payer: Self-pay | Admitting: Internal Medicine

## 2014-07-14 ENCOUNTER — Telehealth: Payer: Self-pay

## 2014-07-14 NOTE — Telephone Encounter (Signed)
LVM for the patient to call the practice to confirm date of flu vaccination or to call the office for an apt to take a flu shot. 

## 2014-07-28 DIAGNOSIS — H6123 Impacted cerumen, bilateral: Secondary | ICD-10-CM | POA: Diagnosis not present

## 2014-07-28 DIAGNOSIS — H903 Sensorineural hearing loss, bilateral: Secondary | ICD-10-CM | POA: Diagnosis not present

## 2014-09-21 ENCOUNTER — Encounter: Payer: Self-pay | Admitting: Internal Medicine

## 2014-09-24 ENCOUNTER — Encounter: Payer: Self-pay | Admitting: Internal Medicine

## 2014-09-24 ENCOUNTER — Ambulatory Visit (INDEPENDENT_AMBULATORY_CARE_PROVIDER_SITE_OTHER): Payer: Medicare Other | Admitting: Internal Medicine

## 2014-09-24 VITALS — BP 136/62 | HR 68 | Temp 97.8°F | Resp 15 | Wt 185.8 lb

## 2014-09-24 DIAGNOSIS — R059 Cough, unspecified: Secondary | ICD-10-CM

## 2014-09-24 DIAGNOSIS — K2100 Gastro-esophageal reflux disease with esophagitis, without bleeding: Secondary | ICD-10-CM

## 2014-09-24 DIAGNOSIS — J310 Chronic rhinitis: Secondary | ICD-10-CM | POA: Diagnosis not present

## 2014-09-24 DIAGNOSIS — R05 Cough: Secondary | ICD-10-CM

## 2014-09-24 MED ORDER — HYDROCODONE-HOMATROPINE 5-1.5 MG/5ML PO SYRP: 5.0000 mL | ORAL_SOLUTION | Freq: Four times a day (QID) | ORAL | Status: DC | PRN

## 2014-09-24 NOTE — Patient Instructions (Signed)
Reflux of gastric acid may be asymptomatic as this may occur mainly during sleep.The triggers for reflux  include stress; the "aspirin family" ; alcohol; peppermint; and caffeine (coffee, tea, cola, and chocolate). The aspirin family would include aspirin and the nonsteroidal agents such as ibuprofen &  Naproxen. Tylenol would not cause reflux. If having symptoms ; food & drink should be avoided for @ least 2 hours before going to bed.   Plain Mucinex (NOT D) for thick secretions ;force NON dairy fluids .   Nasal cleansing in the shower as discussed with lather of mild shampoo.After 10 seconds wash off lather while  exhaling through nostrils. Make sure that all residual soap is removed to prevent irritation.  Flonase OR Nasacort AQ 1 spray in each nostril twice a day as needed. Use the "crossover" technique into opposite nostril spraying toward opposite ear @ 45 degree angle, not straight up into nostril.  Plain Allegra (NOT D )  160 daily , Loratidine 10 mg , OR Zyrtec 10 mg @ bedtime  as needed for itchy eyes & sneezing.

## 2014-09-24 NOTE — Progress Notes (Signed)
Pre visit review using our clinic review tool, if applicable. No additional management support is needed unless otherwise documented below in the visit note. 

## 2014-09-24 NOTE — Progress Notes (Signed)
   Subjective:    Patient ID: James Noble, male    DOB: 08/11/1940, 74 y.o.   MRN: [id number]  HPI Symptoms began 1 week ago as "cold symptoms". He's been coughing up clear mucus especially at night. He has been exposed to sick 56-year-old grandchild.  Significantly he has a history of GERD. While on Nexium he had nausea and vomiting which resulted in clinical dehydration. He's been on ranitidine with significant response. He still does have dysphagia with sandwiches; he is avoiding bread. Endoscopy is scheduled.  He has no upper respiratory tract infection symptoms.   Review of Systems  Frontal headache, facial pain , nasal purulence, dental pain, sore throat , otic pain or otic discharge denied. No fever , chills or sweats. Extrinsic symptoms of itchy, watery eyes, sneezing, or angioedema are denied. There is no significant sputum production, wheezing,or  paroxysmal nocturnal dyspnea.    Objective:   Physical Exam   General appearance:Adequately nourished; no acute distress or increased work of breathing is present.    Lymphatic: No  lymphadenopathy about the head, neck, or axilla .  Eyes: No conjunctival inflammation or lid edema is present. There is no scleral icterus.  Ears:  External ear exam shows no significant lesions or deformities.  Otoscopic examination reveals clear canals, tympanic membranes are intact bilaterally without bulging, retraction, inflammation or discharge.  Nose:  External nasal examination shows no deformity or inflammation. Nasal mucosa are erythematous and dry especially on the right  without lesions or exudates No septal dislocation or deviation.No obstruction to airflow.   Oral exam: Dental hygiene is good; lips and gums are healthy appearing.There is no oropharyngeal erythema or exudate .  Neck:  No deformities, thyromegaly, masses, or tenderness noted.   Supple with full range of motion without pain.   Heart:  Normal rate and regular rhythm. S1 and  S2 normal without gallop, murmur, click, rub or other extra sounds.   Lungs:Chest clear to auscultation; no wheezes, rhonchi,rales ,or rubs present.  Extremities:  No cyanosis, edema, or clubbing  noted    Skin: Warm & dry w/o tenting or jaundice. No significant lesions or rash.      Assessment & Plan:  #1 non allergic rhinitis #2 cough #3 GERD See Orders & AVS

## 2014-10-05 DIAGNOSIS — H903 Sensorineural hearing loss, bilateral: Secondary | ICD-10-CM | POA: Diagnosis not present

## 2014-10-05 DIAGNOSIS — H6123 Impacted cerumen, bilateral: Secondary | ICD-10-CM | POA: Diagnosis not present

## 2014-10-08 ENCOUNTER — Encounter: Payer: Medicare Other | Admitting: Internal Medicine

## 2014-10-08 DIAGNOSIS — L814 Other melanin hyperpigmentation: Secondary | ICD-10-CM | POA: Diagnosis not present

## 2014-10-08 DIAGNOSIS — Z85828 Personal history of other malignant neoplasm of skin: Secondary | ICD-10-CM | POA: Diagnosis not present

## 2014-10-08 DIAGNOSIS — L821 Other seborrheic keratosis: Secondary | ICD-10-CM | POA: Diagnosis not present

## 2014-10-08 DIAGNOSIS — L738 Other specified follicular disorders: Secondary | ICD-10-CM | POA: Diagnosis not present

## 2014-10-08 DIAGNOSIS — D2271 Melanocytic nevi of right lower limb, including hip: Secondary | ICD-10-CM | POA: Diagnosis not present

## 2014-10-08 DIAGNOSIS — D1801 Hemangioma of skin and subcutaneous tissue: Secondary | ICD-10-CM | POA: Diagnosis not present

## 2014-10-15 ENCOUNTER — Ambulatory Visit (INDEPENDENT_AMBULATORY_CARE_PROVIDER_SITE_OTHER): Payer: Medicare Other | Admitting: Internal Medicine

## 2014-10-15 ENCOUNTER — Encounter: Payer: Self-pay | Admitting: Internal Medicine

## 2014-10-15 VITALS — BP 146/84 | HR 71 | Temp 98.1°F | Resp 16 | Ht 72.0 in | Wt 184.0 lb

## 2014-10-15 DIAGNOSIS — I10 Essential (primary) hypertension: Secondary | ICD-10-CM | POA: Diagnosis not present

## 2014-10-15 DIAGNOSIS — Z86010 Personal history of colonic polyps: Secondary | ICD-10-CM | POA: Diagnosis not present

## 2014-10-15 DIAGNOSIS — E785 Hyperlipidemia, unspecified: Secondary | ICD-10-CM

## 2014-10-15 NOTE — Progress Notes (Signed)
Subjective:    Patient ID: James Noble, male    DOB: November 10, 1940, 74 y.o.   MRN: [id number]  HPI The patient is here to assess status of active health conditions.  PMH, FH, & Social History reviewed & updated.  He is on a heart healthy diet with sodium restriction. He exercises four-5 times a week for 75-80 minutes without cardiopulmonary symptoms. Blood pressure averages 130/85.  He has 2 or more glasses of wine per night. He smoked 1960-1970 up to 1 pack per day.  His colonoscopy is up-to-date. He did have polyps removed. Dr. Earlean Shawl will be performing an endoscopy to evaluate hiatal hernia and GERD symptoms. He has been on Nexium but this caused diarrhea and vomiting.  He has no active GI symptoms at this time.  Family history strongly positive for stroke in the maternal family. 3 maternal uncles, maternal grandmother, maternal grandfather, and mother had strokes. 80s. His father had a heart attack at 61.    Review of Systems  Chest pain, palpitations, tachycardia, exertional dyspnea, paroxysmal nocturnal dyspnea, claudication or edema are absent. No unexplained weight loss, abdominal pain, significant dyspepsia, dysphagia, melena, rectal bleeding, or persistently small caliber stools. Dysuria, pyuria, hematuria, frequency, nocturia or polyuria are denied. Change in hair, skin, nails denied. No bowel changes of constipation or diarrhea. No intolerance to heat or cold.     Objective:   Physical Exam Gen.: Adequately nourished in appearance. Alert, appropriate and cooperative throughout exam. Appears younger than stated age  Head: Normocephalic without obvious abnormalities  Eyes: No corneal or conjunctival inflammation noted. Pupils equal round reactive to light and accommodation. Extraocular motion intact.  Ears: Hearing aids bilaterally.External  ear exam reveals no significant lesions or deformities. Canals clear .TMs dull. Hearing is grossly normal bilaterally. Nose:  External nasal exam reveals no deformity or inflammation. Nasal mucosa are pink and moist. No lesions or exudates noted.   Mouth: Oral mucosa and oropharynx reveal no lesions or exudates. Teeth in good repair. Neck: No deformities, masses, or tenderness noted. Range of motion & Thyroid normal. Lungs: Normal respiratory effort; chest expands symmetrically. Lungs are clear to auscultation without rales, wheezes, or increased work of breathing. Heart: Normal rate and rhythm. Normal S1 and S2. No gallop, click, or rub. No murmur. Abdomen: Bowel sounds normal; abdomen soft and nontender. No masses, organomegaly or hernias noted. Genitalia: Genitalia normal except for left varices. Prostate is normal without enlargement, asymmetry, nodularity, or induration                                Musculoskeletal/extremities: No deformity or scoliosis noted of  the thoracic or lumbar spine. No clubbing, cyanosis, edema, or significant extremity  deformity noted.  Range of motion normal . Tone & strength normal. Hand joints normal  Fingernail  health good. Able to lie down & sit up w/o help.  Negative SLR bilaterally Vascular: Carotid, radial artery, dorsalis pedis and  posterior tibial pulses are full and equal. No bruits present. Neurologic: Alert and oriented x3. Deep tendon reflexes symmetrical and normal.  Gait normal    Skin: Intact without suspicious lesions or rashes. Lymph: No cervical, axillary, or inguinal lymphadenopathy present. Psych: Mood and affect are normal. Normally interactive  Assessment & Plan:  See Current Assessment & Plan in Problem List under specific Diagnosis

## 2014-10-15 NOTE — Progress Notes (Signed)
Pre visit review using our clinic review tool, if applicable. No additional management support is needed unless otherwise documented below in the visit note. 

## 2014-10-15 NOTE — Patient Instructions (Signed)
Minimal Blood Pressure Goal= AVERAGE < 140/90;  Ideal is an AVERAGE < 135/85. This AVERAGE should be calculated from @ least 5-7 BP readings taken @ different times of day on different days of week. You should not respond to isolated BP readings , but rather the AVERAGE for that week .Please bring your  blood pressure cuff to office visits to verify that it is reliable.It  can also be checked against the blood pressure device at the pharmacy. Finger or wrist cuffs are not dependable; an arm cuff is. Reflux of gastric acid may be asymptomatic as this may occur mainly during sleep.The triggers for reflux  include stress; the "aspirin family" ; alcohol; peppermint; and caffeine (coffee, tea, cola, and chocolate). The aspirin family would include aspirin and the nonsteroidal agents such as ibuprofen &  Naproxen. Tylenol would not cause reflux. If having symptoms ; food & drink should be avoided for @ least 2 hours before going to bed.  

## 2014-10-16 ENCOUNTER — Encounter: Payer: Self-pay | Admitting: Internal Medicine

## 2014-10-16 NOTE — Assessment & Plan Note (Signed)
Blood pressure goals reviewed. BMET reviewed 

## 2014-10-16 NOTE — Assessment & Plan Note (Signed)
F/U pending with Dr Earlean Shawl

## 2014-10-16 NOTE — Assessment & Plan Note (Signed)
NMR Lipids, LFTs, TSH

## 2014-10-22 ENCOUNTER — Other Ambulatory Visit: Payer: Self-pay | Admitting: Internal Medicine

## 2014-10-22 ENCOUNTER — Other Ambulatory Visit (INDEPENDENT_AMBULATORY_CARE_PROVIDER_SITE_OTHER): Payer: Medicare Other

## 2014-10-22 DIAGNOSIS — E785 Hyperlipidemia, unspecified: Secondary | ICD-10-CM | POA: Diagnosis not present

## 2014-10-22 LAB — TSH: TSH: 2.39 u[IU]/mL (ref 0.35–4.50)

## 2014-10-22 LAB — HEPATIC FUNCTION PANEL
ALT: 31 U/L (ref 0–53)
AST: 30 U/L (ref 0–37)
Albumin: 4.2 g/dL (ref 3.5–5.2)
Alkaline Phosphatase: 54 U/L (ref 39–117)
Bilirubin, Direct: 0.2 mg/dL (ref 0.0–0.3)
Total Bilirubin: 0.8 mg/dL (ref 0.2–1.2)
Total Protein: 7 g/dL (ref 6.0–8.3)

## 2014-10-24 LAB — NMR LIPOPROFILE WITH LIPIDS
Cholesterol, Total: 138 mg/dL (ref 100–199)
HDL Particle Number: 31.4 umol/L (ref >=30.5)
HDL Size: 8.6 nm — ABNORMAL LOW (ref >=9.2)
HDL-C: 40 mg/dL (ref >39)
LDL (calc): 75 mg/dL (ref 0–99)
LDL Particle Number: 1060 nmol/L — ABNORMAL HIGH (ref <1000)
LDL Size: 20.8 nm (ref >=20.8)
LP-IR Score: 65 — ABNORMAL HIGH (ref <=45)
Large HDL-P: 3.6 umol/L — ABNORMAL LOW (ref >=4.8)
Large VLDL-P: 2.1 nmol/L (ref <=2.7)
Small LDL Particle Number: 454 nmol/L (ref <=527)
Triglycerides: 115 mg/dL (ref 0–149)
VLDL Size: 47.1 nm — ABNORMAL HIGH (ref <=46.6)

## 2014-11-12 DIAGNOSIS — K317 Polyp of stomach and duodenum: Secondary | ICD-10-CM | POA: Diagnosis not present

## 2014-11-12 DIAGNOSIS — K228 Other specified diseases of esophagus: Secondary | ICD-10-CM | POA: Diagnosis not present

## 2014-11-12 DIAGNOSIS — K219 Gastro-esophageal reflux disease without esophagitis: Secondary | ICD-10-CM | POA: Diagnosis not present

## 2014-11-12 DIAGNOSIS — K2990 Gastroduodenitis, unspecified, without bleeding: Secondary | ICD-10-CM | POA: Diagnosis not present

## 2014-11-12 DIAGNOSIS — K259 Gastric ulcer, unspecified as acute or chronic, without hemorrhage or perforation: Secondary | ICD-10-CM | POA: Diagnosis not present

## 2014-11-12 DIAGNOSIS — K2950 Unspecified chronic gastritis without bleeding: Secondary | ICD-10-CM | POA: Diagnosis not present

## 2014-11-12 DIAGNOSIS — K2980 Duodenitis without bleeding: Secondary | ICD-10-CM | POA: Diagnosis not present

## 2014-11-12 DIAGNOSIS — K2970 Gastritis, unspecified, without bleeding: Secondary | ICD-10-CM | POA: Diagnosis not present

## 2014-11-12 DIAGNOSIS — R857 Abnormal histological findings in specimens from digestive organs and abdominal cavity: Secondary | ICD-10-CM | POA: Diagnosis not present

## 2014-11-12 DIAGNOSIS — K449 Diaphragmatic hernia without obstruction or gangrene: Secondary | ICD-10-CM | POA: Diagnosis not present

## 2014-11-12 DIAGNOSIS — K209 Esophagitis, unspecified: Secondary | ICD-10-CM | POA: Diagnosis not present

## 2014-11-23 ENCOUNTER — Other Ambulatory Visit: Payer: Self-pay | Admitting: Emergency Medicine

## 2014-11-23 ENCOUNTER — Other Ambulatory Visit: Payer: Self-pay | Admitting: Internal Medicine

## 2014-11-23 MED ORDER — ROSUVASTATIN CALCIUM 20 MG PO TABS: 20.0000 mg | ORAL_TABLET | Freq: Every day | ORAL | Status: DC

## 2014-12-07 DIAGNOSIS — H903 Sensorineural hearing loss, bilateral: Secondary | ICD-10-CM | POA: Diagnosis not present

## 2014-12-07 DIAGNOSIS — H6123 Impacted cerumen, bilateral: Secondary | ICD-10-CM | POA: Diagnosis not present

## 2014-12-10 ENCOUNTER — Encounter: Payer: Self-pay | Admitting: Internal Medicine

## 2014-12-10 DIAGNOSIS — H2513 Age-related nuclear cataract, bilateral: Secondary | ICD-10-CM | POA: Diagnosis not present

## 2015-01-06 ENCOUNTER — Telehealth: Payer: Self-pay | Admitting: Emergency Medicine

## 2015-01-06 ENCOUNTER — Other Ambulatory Visit: Payer: Self-pay | Admitting: Internal Medicine

## 2015-01-06 MED ORDER — LOSARTAN POTASSIUM 100 MG PO TABS: ORAL_TABLET | ORAL | Status: DC

## 2015-01-06 NOTE — Telephone Encounter (Signed)
Please advise, lact OV 6/16

## 2015-01-06 NOTE — Telephone Encounter (Signed)
OK X 3 mos 

## 2015-01-06 NOTE — Telephone Encounter (Signed)
Sent../lmb

## 2015-01-06 NOTE — Telephone Encounter (Signed)
Last OV 6/16. Please advise

## 2015-02-08 DIAGNOSIS — H903 Sensorineural hearing loss, bilateral: Secondary | ICD-10-CM | POA: Diagnosis not present

## 2015-02-08 DIAGNOSIS — H6123 Impacted cerumen, bilateral: Secondary | ICD-10-CM | POA: Diagnosis not present

## 2015-02-18 DIAGNOSIS — Z23 Encounter for immunization: Secondary | ICD-10-CM | POA: Diagnosis not present

## 2015-03-29 DIAGNOSIS — H6123 Impacted cerumen, bilateral: Secondary | ICD-10-CM | POA: Diagnosis not present

## 2015-03-29 DIAGNOSIS — H903 Sensorineural hearing loss, bilateral: Secondary | ICD-10-CM | POA: Diagnosis not present

## 2015-04-06 ENCOUNTER — Ambulatory Visit (INDEPENDENT_AMBULATORY_CARE_PROVIDER_SITE_OTHER): Payer: Medicare Other

## 2015-04-06 DIAGNOSIS — Z299 Encounter for prophylactic measures, unspecified: Secondary | ICD-10-CM

## 2015-04-06 DIAGNOSIS — Z23 Encounter for immunization: Secondary | ICD-10-CM | POA: Diagnosis not present

## 2015-04-06 DIAGNOSIS — Z418 Encounter for other procedures for purposes other than remedying health state: Secondary | ICD-10-CM

## 2015-04-08 LAB — TB SKIN TEST
Induration: 0 mm
TB Skin Test: NEGATIVE

## 2015-06-01 DIAGNOSIS — H903 Sensorineural hearing loss, bilateral: Secondary | ICD-10-CM | POA: Diagnosis not present

## 2015-06-01 DIAGNOSIS — H6123 Impacted cerumen, bilateral: Secondary | ICD-10-CM | POA: Diagnosis not present

## 2015-06-04 ENCOUNTER — Other Ambulatory Visit: Payer: Self-pay | Admitting: Internal Medicine

## 2015-07-06 ENCOUNTER — Other Ambulatory Visit: Payer: Self-pay | Admitting: Internal Medicine

## 2015-07-27 DIAGNOSIS — H6123 Impacted cerumen, bilateral: Secondary | ICD-10-CM | POA: Diagnosis not present

## 2015-07-27 DIAGNOSIS — H903 Sensorineural hearing loss, bilateral: Secondary | ICD-10-CM | POA: Diagnosis not present

## 2015-09-02 DIAGNOSIS — H578 Other specified disorders of eye and adnexa: Secondary | ICD-10-CM | POA: Diagnosis not present

## 2015-10-11 DIAGNOSIS — H6123 Impacted cerumen, bilateral: Secondary | ICD-10-CM | POA: Diagnosis not present

## 2015-10-11 DIAGNOSIS — H903 Sensorineural hearing loss, bilateral: Secondary | ICD-10-CM | POA: Diagnosis not present

## 2015-10-14 ENCOUNTER — Encounter: Payer: Self-pay | Admitting: Internal Medicine

## 2015-10-14 ENCOUNTER — Ambulatory Visit (INDEPENDENT_AMBULATORY_CARE_PROVIDER_SITE_OTHER): Payer: Medicare Other | Admitting: Internal Medicine

## 2015-10-14 ENCOUNTER — Other Ambulatory Visit (INDEPENDENT_AMBULATORY_CARE_PROVIDER_SITE_OTHER): Payer: Medicare Other

## 2015-10-14 VITALS — BP 144/90 | HR 62 | Temp 97.8°F | Resp 16 | Ht 71.0 in | Wt 185.0 lb

## 2015-10-14 DIAGNOSIS — E785 Hyperlipidemia, unspecified: Secondary | ICD-10-CM

## 2015-10-14 DIAGNOSIS — Z125 Encounter for screening for malignant neoplasm of prostate: Secondary | ICD-10-CM | POA: Diagnosis not present

## 2015-10-14 DIAGNOSIS — I10 Essential (primary) hypertension: Secondary | ICD-10-CM

## 2015-10-14 DIAGNOSIS — K219 Gastro-esophageal reflux disease without esophagitis: Secondary | ICD-10-CM | POA: Insufficient documentation

## 2015-10-14 DIAGNOSIS — K449 Diaphragmatic hernia without obstruction or gangrene: Secondary | ICD-10-CM | POA: Diagnosis not present

## 2015-10-14 DIAGNOSIS — D696 Thrombocytopenia, unspecified: Secondary | ICD-10-CM

## 2015-10-14 DIAGNOSIS — R739 Hyperglycemia, unspecified: Secondary | ICD-10-CM

## 2015-10-14 DIAGNOSIS — M48061 Spinal stenosis, lumbar region without neurogenic claudication: Secondary | ICD-10-CM

## 2015-10-14 DIAGNOSIS — M4806 Spinal stenosis, lumbar region: Secondary | ICD-10-CM

## 2015-10-14 LAB — COMPREHENSIVE METABOLIC PANEL
ALT: 21 U/L (ref 0–53)
AST: 22 U/L (ref 0–37)
Albumin: 4.3 g/dL (ref 3.5–5.2)
Alkaline Phosphatase: 52 U/L (ref 39–117)
BUN: 16 mg/dL (ref 6–23)
CO2: 30 mEq/L (ref 19–32)
Calcium: 9.2 mg/dL (ref 8.4–10.5)
Chloride: 104 mEq/L (ref 96–112)
Creatinine, Ser: 0.97 mg/dL (ref 0.40–1.50)
GFR: 80.54 mL/min (ref >60.00)
Glucose, Bld: 96 mg/dL (ref 70–99)
Potassium: 4.8 mEq/L (ref 3.5–5.1)
Sodium: 140 mEq/L (ref 135–145)
Total Bilirubin: 0.8 mg/dL (ref 0.2–1.2)
Total Protein: 7 g/dL (ref 6.0–8.3)

## 2015-10-14 LAB — HEMOGLOBIN A1C: Hgb A1c MFr Bld: 5.2 % (ref 4.6–6.5)

## 2015-10-14 LAB — CBC WITH DIFFERENTIAL/PLATELET
Basophils Absolute: 0 10*3/uL (ref 0.0–0.1)
Basophils Relative: 0.4 % (ref 0.0–3.0)
Eosinophils Absolute: 0.3 10*3/uL (ref 0.0–0.7)
Eosinophils Relative: 5.5 % — ABNORMAL HIGH (ref 0.0–5.0)
HCT: 44.6 % (ref 39.0–52.0)
Hemoglobin: 15.1 g/dL (ref 13.0–17.0)
Lymphocytes Relative: 24.4 % (ref 12.0–46.0)
Lymphs Abs: 1.4 10*3/uL (ref 0.7–4.0)
MCHC: 33.8 g/dL (ref 30.0–36.0)
MCV: 98.7 fl (ref 78.0–100.0)
Monocytes Absolute: 0.4 10*3/uL (ref 0.1–1.0)
Monocytes Relative: 6.4 % (ref 3.0–12.0)
Neutro Abs: 3.7 10*3/uL (ref 1.4–7.7)
Neutrophils Relative %: 63.3 % (ref 43.0–77.0)
Platelets: 191 10*3/uL (ref 150.0–400.0)
RBC: 4.52 Mil/uL (ref 4.22–5.81)
RDW: 13.4 % (ref 11.5–15.5)
WBC: 5.8 10*3/uL (ref 4.0–10.5)

## 2015-10-14 LAB — LIPID PANEL
Cholesterol: 237 mg/dL — ABNORMAL HIGH (ref 0–200)
HDL: 46.1 mg/dL (ref >39.00)
LDL Cholesterol: 171 mg/dL — ABNORMAL HIGH (ref 0–99)
NonHDL: 191.09
Total CHOL/HDL Ratio: 5
Triglycerides: 101 mg/dL (ref 0.0–149.0)
VLDL: 20.2 mg/dL (ref 0.0–40.0)

## 2015-10-14 LAB — PSA, MEDICARE: PSA: 4.04 ng/ml — ABNORMAL HIGH (ref 0.10–4.00)

## 2015-10-14 LAB — TSH: TSH: 2.17 u[IU]/mL (ref 0.35–4.50)

## 2015-10-14 NOTE — Assessment & Plan Note (Addendum)
Check cbc Last couple of cbc's have been normal

## 2015-10-14 NOTE — Assessment & Plan Note (Signed)
a1c

## 2015-10-14 NOTE — Patient Instructions (Addendum)
  Test(s) ordered today. Your results will be released to MyChart (or called to you) after review, usually within 72hours after test completion. If any changes need to be made, you will be notified at that same time.  All other Health Maintenance issues reviewed.   All recommended immunizations and age-appropriate screenings are up-to-date or discussed.  No immunizations administered today.   Medications reviewed and updated.  No changes recommended at this time.   Please followup in one year   

## 2015-10-14 NOTE — Assessment & Plan Note (Signed)
Chronic Mild left sided sciatica Fairly asymptomatic, except mild sciatica Exercising daily Not currently taking gabapentin or other meds

## 2015-10-14 NOTE — Assessment & Plan Note (Signed)
Dr Earlean Shawl Related to hiatal hernia Has GERD, hoarseness, cough On zantac

## 2015-10-14 NOTE — Progress Notes (Signed)
Pre visit review using our clinic review tool, if applicable. No additional management support is needed unless otherwise documented below in the visit note. 

## 2015-10-14 NOTE — Assessment & Plan Note (Addendum)
BP slightly elevated here today - did not take meds this morning BP well controlled typically and at home Current regimen effective and well tolerated Continue current medications at current doses cmp

## 2015-10-14 NOTE — Assessment & Plan Note (Signed)
Taking crestor 20 mg daily Check lipids, tsh

## 2015-10-14 NOTE — Progress Notes (Signed)
Subjective:    Patient ID: James Noble, male    DOB: 16-Aug-1940, 75 y.o.   MRN: [id number]  HPI He is here to establish with a new pcp.   He is here for follow up.  Hypertension: He is taking his medication daily. He is compliant with a low sodium diet.  He denies chest pain, palpitations, edema, shortness of breath and regular headaches. He is exercising regularly.  He does monitor his blood pressure at home and it is well controlled.    Hyperlipidemia: He is taking his medication daily. He is compliant with a low fat/cholesterol diet. He is exercising regularly. He denies myalgias.   Chronic lower back pain/spinal stenosis, left sided sciatica - mild:  He has chronic back pain.  It was worse in the past and he was taking gabapentin regularly, but the pain improved after several weeks his pain improved.  He has not taken the gabapentin in a while.  He exercises regularly.  He has some very mild left sided sciatica.    H/o hyperglycmeia: He eats healthy and is exercising regularly.    GERD, hiatal hernia:  He is seeing GI - Dr Earlean Shawl.  He has GERD and is taking zantac daily.  His GERD is controlled.  He has hoarseness from the GERD.  He occasionally has a cough from this.  He denies abdominal pain.   Medications and allergies reviewed with patient and updated if appropriate.  Patient Active Problem List   Diagnosis Date Noted  . Thrombocytopenia ([place]) 03/19/2012  . HTN (hypertension) 08/21/2011  . ARTHRALGIA 09/03/2009  . NONSPECIFIC ABNORMAL ELECTROCARDIOGRAM 09/03/2009  . HYPERPLASIA PROSTATE UNS W/O UR OBST & OTH LUTS 08/28/2008  . SKIN CANCER, HX OF 08/28/2008  . History of colonic polyps 08/28/2008  . SPINAL STENOSIS, LUMBAR 07/19/2007  . Hyperlipidemia 04/26/2007  . GILBERT'S SYNDROME 04/26/2007  . RBBB 04/26/2007    Current Outpatient Prescriptions on File Prior to Visit  Medication Sig Dispense Refill  . amLODipine (NORVASC) 5 MG tablet TAKE TWO TABLETS EVERY DAY 180  tablet 3  . gabapentin (NEURONTIN) 300 MG capsule TAKE ONE CAPSULE AT NIGHT 90 capsule 0  . losartan (COZAAR) 100 MG tablet TAKE ONE TABLET EACH DAY 90 tablet 1  . rosuvastatin (CRESTOR) 20 MG tablet Take 1 tablet (20 mg total) by mouth at bedtime. Must transition care w/new provider for future refills 90 tablet 1   No current facility-administered medications on file prior to visit.    Past Medical History  Diagnosis Date  . Sciatica   . Hyperlipidemia     BASED ON NMR;GILBERTS SYNDROME  . Spinal stenosis     OF LS SPINE...DR.LOVE  . Hx of colonic polyps     DR.MEDOFF  . Cancer (HCC)     BASAL CELL , DR.LOMAX  . Hyperuricemia without signs inflammatory arthritis/tophaceous disease 2012    uric acid 7.7    Past Surgical History  Procedure Laterality Date  . Upper gastrointestinal endoscopy  2008    Dr Earlean Shawl; Hiatal hernia  . Inguinal hernia repair  1968  . Hemorrhoid surgery  1986  . Tonsillectomy and adenoidectomy  1949  . Colonoscopy with polypectomy  2005 & 2008    Dr Earlean Shawl  . Vasectomy    . Steroid spinal injections  2013    X2, Dr Ernestina Patches    Social History   Social History  . Marital Status: Married    Spouse Name: N/A  . Number of Children:  N/A  . Years of Education: N/A   Occupational History  . ORTHODONTIST    Social History Main Topics  . Smoking status: Former Smoker    Quit date: 05/01/1968  . Smokeless tobacco: None     Comment: smoked 1960-1970, up to 1 ppd  . Alcohol Use: 8.4 oz/week    14 Glasses of wine per week     Comment:  socially  . Drug Use: No  . Sexual Activity: Not Asked   Other Topics Concern  . None   Social History Narrative   HEART HEALTHY DIET   EXERCISES 4-6 TIME WEEKLY    Family History  Problem Relation Age of Onset  . Heart attack Father 31     RHD, smoker  . Stroke Maternal Grandmother     late 15s  . Stroke Mother 69    smoker  . Stroke Maternal Uncle     X 3  . Stroke Maternal Grandfather   . Diabetes  Neg Hx     Review of Systems  Constitutional: Negative for fever, appetite change, fatigue and unexpected weight change.  Respiratory: Positive for cough (with hoarseness from gerd). Negative for shortness of breath and wheezing.   Cardiovascular: Negative for chest pain, palpitations and leg swelling.  Gastrointestinal: Negative for abdominal pain, diarrhea, constipation and blood in stool.  Genitourinary: Negative for dysuria and difficulty urinating.  Neurological: Positive for numbness (left leg - sciatica). Negative for dizziness, light-headedness and headaches.       Objective:   Filed Vitals:   10/14/15 0756  BP: 144/90  Pulse: 62  Temp: 97.8 F (36.6 C)  Resp: 16   Filed Weights   10/14/15 0756  Weight: 185 lb (83.915 kg)   Body mass index is 25.81 kg/(m^2).   Physical Exam Constitutional: Appears well-developed and well-nourished. No distress.  Neck: Neck supple. No tracheal deviation present. No thyromegaly present.  No carotid bruit. No cervical adenopathy.   Cardiovascular: Normal rate, regular rhythm and normal heart sounds.   No murmur heard.  No edema Pulmonary/Chest: Effort normal and breath sounds normal. No respiratory distress. No wheezes.       Assessment & Plan:   See Problem List for Assessment and Plan of chronic medical problems.

## 2015-10-18 ENCOUNTER — Telehealth: Payer: Self-pay | Admitting: Emergency Medicine

## 2015-10-18 DIAGNOSIS — R972 Elevated prostate specific antigen [PSA]: Secondary | ICD-10-CM

## 2015-10-18 DIAGNOSIS — E785 Hyperlipidemia, unspecified: Secondary | ICD-10-CM

## 2015-10-18 NOTE — Telephone Encounter (Signed)
Labs entered for pts re-check in 3 months.

## 2015-10-21 DIAGNOSIS — K219 Gastro-esophageal reflux disease without esophagitis: Secondary | ICD-10-CM | POA: Diagnosis not present

## 2015-11-05 ENCOUNTER — Other Ambulatory Visit: Payer: Self-pay | Admitting: Internal Medicine

## 2015-11-16 DIAGNOSIS — Z888 Allergy status to other drugs, medicaments and biological substances status: Secondary | ICD-10-CM | POA: Diagnosis not present

## 2015-11-16 DIAGNOSIS — J383 Other diseases of vocal cords: Secondary | ICD-10-CM | POA: Diagnosis not present

## 2015-11-16 DIAGNOSIS — Z7982 Long term (current) use of aspirin: Secondary | ICD-10-CM | POA: Diagnosis not present

## 2015-11-16 DIAGNOSIS — Z87891 Personal history of nicotine dependence: Secondary | ICD-10-CM | POA: Diagnosis not present

## 2015-11-16 DIAGNOSIS — Z79899 Other long term (current) drug therapy: Secondary | ICD-10-CM | POA: Diagnosis not present

## 2015-11-16 DIAGNOSIS — J385 Laryngeal spasm: Secondary | ICD-10-CM | POA: Diagnosis not present

## 2015-11-16 DIAGNOSIS — R490 Dysphonia: Secondary | ICD-10-CM | POA: Diagnosis not present

## 2015-11-16 DIAGNOSIS — I10 Essential (primary) hypertension: Secondary | ICD-10-CM | POA: Diagnosis not present

## 2015-11-17 DIAGNOSIS — J383 Other diseases of vocal cords: Secondary | ICD-10-CM | POA: Insufficient documentation

## 2015-11-17 DIAGNOSIS — R490 Dysphonia: Secondary | ICD-10-CM | POA: Insufficient documentation

## 2015-11-17 DIAGNOSIS — J385 Laryngeal spasm: Secondary | ICD-10-CM | POA: Insufficient documentation

## 2015-12-02 ENCOUNTER — Other Ambulatory Visit: Payer: Self-pay | Admitting: Internal Medicine

## 2015-12-02 DIAGNOSIS — H6123 Impacted cerumen, bilateral: Secondary | ICD-10-CM | POA: Diagnosis not present

## 2015-12-02 DIAGNOSIS — H903 Sensorineural hearing loss, bilateral: Secondary | ICD-10-CM | POA: Diagnosis not present

## 2015-12-16 DIAGNOSIS — L570 Actinic keratosis: Secondary | ICD-10-CM | POA: Diagnosis not present

## 2015-12-16 DIAGNOSIS — L814 Other melanin hyperpigmentation: Secondary | ICD-10-CM | POA: Diagnosis not present

## 2015-12-16 DIAGNOSIS — Z85828 Personal history of other malignant neoplasm of skin: Secondary | ICD-10-CM | POA: Diagnosis not present

## 2015-12-16 DIAGNOSIS — L821 Other seborrheic keratosis: Secondary | ICD-10-CM | POA: Diagnosis not present

## 2015-12-16 DIAGNOSIS — D2271 Melanocytic nevi of right lower limb, including hip: Secondary | ICD-10-CM | POA: Diagnosis not present

## 2015-12-16 DIAGNOSIS — D1801 Hemangioma of skin and subcutaneous tissue: Secondary | ICD-10-CM | POA: Diagnosis not present

## 2015-12-16 DIAGNOSIS — L738 Other specified follicular disorders: Secondary | ICD-10-CM | POA: Diagnosis not present

## 2015-12-22 ENCOUNTER — Ambulatory Visit (INDEPENDENT_AMBULATORY_CARE_PROVIDER_SITE_OTHER): Payer: Medicare Other | Admitting: Nurse Practitioner

## 2015-12-22 ENCOUNTER — Encounter: Payer: Self-pay | Admitting: Nurse Practitioner

## 2015-12-22 VITALS — BP 152/82 | HR 53 | Temp 97.9°F | Resp 16 | Ht 71.0 in | Wt 188.0 lb

## 2015-12-22 DIAGNOSIS — Z23 Encounter for immunization: Secondary | ICD-10-CM | POA: Diagnosis not present

## 2015-12-22 DIAGNOSIS — S39012A Strain of muscle, fascia and tendon of lower back, initial encounter: Secondary | ICD-10-CM | POA: Diagnosis not present

## 2015-12-22 MED ORDER — NAPROXEN 500 MG PO TABS: 500.0000 mg | ORAL_TABLET | Freq: Two times a day (BID) | ORAL | 0 refills | Status: DC | PRN

## 2015-12-22 MED ORDER — CYCLOBENZAPRINE HCL 5 MG PO TABS: 5.0000 mg | ORAL_TABLET | Freq: Two times a day (BID) | ORAL | 0 refills | Status: DC | PRN

## 2015-12-22 NOTE — Progress Notes (Signed)
Subjective:  Patient ID: James Noble, male    DOB: 11-09-1940  Age: 75 y.o. MRN: [id number]  CC: Back Pain (x1 week has had lower back pain, thinks that exercises at the gym caused it)   Back Pain  This is a new problem. The current episode started 1 to 4 weeks ago. The problem occurs intermittently. The problem has been gradually improving since onset. The pain is present in the lumbar spine. The quality of the pain is described as cramping. The pain does not radiate. The pain is at a severity of 5/10. The pain is moderate. The pain is the same all the time. The symptoms are aggravated by bending, twisting and sitting. Pertinent negatives include no abdominal pain, bladder incontinence, bowel incontinence, dysuria, fever, leg pain, numbness, paresis, perianal numbness, tingling or weakness. He has tried NSAIDs, ice and heat for the symptoms. The treatment provided moderate relief.   [name] presents for lower back pain post exercise x 2weeks. He is concerned that pain may worsen due to upcoming vacation trip out of country. Expected 9hrs plane ride.  Outpatient Medications Prior to Visit  Medication Sig Dispense Refill  . amLODipine (NORVASC) 5 MG tablet TAKE TWO TABLETS EVERY DAY 180 tablet 3  . gabapentin (NEURONTIN) 300 MG capsule Take 1 capsule (300 mg total) by mouth at bedtime. 90 capsule 1  . losartan (COZAAR) 100 MG tablet TAKE ONE TABLET EACH DAY 90 tablet 1  . ranitidine (ZANTAC) 300 MG tablet     . rosuvastatin (CRESTOR) 20 MG tablet Take 1 tablet (20 mg total) by mouth at bedtime. Must transition care w/new provider for future refills 90 tablet 1   No facility-administered medications prior to visit.     ROS Review of Systems  Constitutional: Negative for fever.  Gastrointestinal: Negative for abdominal pain and bowel incontinence.  Genitourinary: Negative for bladder incontinence and dysuria.  Musculoskeletal: Positive for back pain.  Neurological: Negative for  tingling, weakness and numbness.    Objective:  BP (!) 152/82 (BP Location: Left Arm, Patient Position: Sitting, Cuff Size: Normal)   Pulse (!) 53   Temp 97.9 F (36.6 C) (Oral)   Resp 16   Ht 5\' 11"  (1.803 m)   Wt 188 lb (85.3 kg)   SpO2 97%   BMI 26.22 kg/m   BP Readings from Last 3 Encounters:  12/22/15 (!) 152/82  10/14/15 (!) 144/90  10/15/14 (!) 146/84    Wt Readings from Last 3 Encounters:  12/22/15 188 lb (85.3 kg)  10/14/15 185 lb (83.9 kg)  10/15/14 184 lb (83.5 kg)    Physical Exam  Constitutional: He is oriented to person, place, and time. He appears well-developed. No distress.  Cardiovascular: Normal rate.   Pulmonary/Chest: Effort normal.  Abdominal: Soft. Bowel sounds are normal. There is no tenderness.  Musculoskeletal: Normal range of motion. He exhibits no edema.       Right hip: Normal.       Left hip: Normal.       Lumbar back: He exhibits tenderness, pain and spasm. He exhibits no bony tenderness, no edema and normal pulse.  Negative straight leg raise.  Neurological: He is alert and oriented to person, place, and time.  Skin: Skin is warm and dry.  Vitals reviewed.   Lab Results  Component Value Date   WBC 5.8 10/14/2015   HGB 15.1 10/14/2015   HCT 44.6 10/14/2015   PLT 191.0 10/14/2015   GLUCOSE 96 10/14/2015  CHOL 237 (H) 10/14/2015   TRIG 101.0 10/14/2015   HDL 46.10 10/14/2015   LDLCALC 171 (H) 10/14/2015   ALT 21 10/14/2015   AST 22 10/14/2015   NA 140 10/14/2015   K 4.8 10/14/2015   CL 104 10/14/2015   CREATININE 0.97 10/14/2015   BUN 16 10/14/2015   CO2 30 10/14/2015   TSH 2.17 10/14/2015   PSA 4.04 (H) 10/14/2015   HGBA1C 5.2 10/14/2015    Assessment & Plan:   Jaemin was seen today for back pain.  Diagnoses and all orders for this visit:  Strain of lumbar paraspinal muscle, initial encounter -     cyclobenzaprine (FLEXERIL) 5 MG tablet; Take 1 tablet (5 mg total) by mouth 2 (two) times daily as needed for muscle  spasms (may cause drowsiness). -     naproxen (NAPROSYN) 500 MG tablet; Take 1 tablet (500 mg total) by mouth 2 (two) times daily as needed (for pain, take with food).   I am having Mr. Kosky start on cyclobenzaprine and naproxen. I am also having him maintain his rosuvastatin, losartan, ranitidine, amLODipine, gabapentin, DOCOSAHEXAENOIC ACID PO, MULTI-VITAMINS, aspirin EC, and Vitamin D2.  Meds ordered this encounter  Medications  . DOCOSAHEXAENOIC ACID PO    Sig: Take 1 g by mouth daily.  . Multiple Vitamin (MULTI-VITAMINS) TABS    Sig: Take 1 tablet by mouth daily.  Marland Kitchen aspirin EC 81 MG tablet    Sig: Take 81 mg by mouth daily.  . Ergocalciferol (VITAMIN D2) 400 units TABS    Sig: Take 1 tablet by mouth daily.  . cyclobenzaprine (FLEXERIL) 5 MG tablet    Sig: Take 1 tablet (5 mg total) by mouth 2 (two) times daily as needed for muscle spasms (may cause drowsiness).    Dispense:  30 tablet    Refill:  0    Order Specific Question:   Supervising Provider    Answer:   Cassandria Anger [[id number]]  . naproxen (NAPROSYN) 500 MG tablet    Sig: Take 1 tablet (500 mg total) by mouth 2 (two) times daily as needed (for pain, take with food).    Dispense:  30 tablet    Refill:  0    Order Specific Question:   Supervising Provider    Answer:   Cassandria Anger [[id number]]     Follow-up: Return if symptoms worsen or fail to improve.  Wilfred Lacy, NP

## 2015-12-22 NOTE — Patient Instructions (Signed)

## 2016-01-10 ENCOUNTER — Other Ambulatory Visit: Payer: Self-pay | Admitting: Internal Medicine

## 2016-01-13 ENCOUNTER — Other Ambulatory Visit (INDEPENDENT_AMBULATORY_CARE_PROVIDER_SITE_OTHER): Payer: Medicare Other

## 2016-01-13 DIAGNOSIS — E785 Hyperlipidemia, unspecified: Secondary | ICD-10-CM

## 2016-01-13 DIAGNOSIS — R490 Dysphonia: Secondary | ICD-10-CM | POA: Diagnosis not present

## 2016-01-13 DIAGNOSIS — J383 Other diseases of vocal cords: Secondary | ICD-10-CM | POA: Diagnosis not present

## 2016-01-13 DIAGNOSIS — J385 Laryngeal spasm: Secondary | ICD-10-CM | POA: Diagnosis not present

## 2016-01-13 LAB — LIPID PANEL
Cholesterol: 134 mg/dL (ref 0–200)
HDL: 38.5 mg/dL — ABNORMAL LOW (ref >39.00)
LDL Cholesterol: 80 mg/dL (ref 0–99)
NonHDL: 95.89
Total CHOL/HDL Ratio: 3
Triglycerides: 78 mg/dL (ref 0.0–149.0)
VLDL: 15.6 mg/dL (ref 0.0–40.0)

## 2016-01-27 DIAGNOSIS — R490 Dysphonia: Secondary | ICD-10-CM | POA: Diagnosis not present

## 2016-01-27 DIAGNOSIS — J385 Laryngeal spasm: Secondary | ICD-10-CM | POA: Diagnosis not present

## 2016-01-27 DIAGNOSIS — J383 Other diseases of vocal cords: Secondary | ICD-10-CM | POA: Diagnosis not present

## 2016-01-31 DIAGNOSIS — H6123 Impacted cerumen, bilateral: Secondary | ICD-10-CM | POA: Diagnosis not present

## 2016-01-31 DIAGNOSIS — H903 Sensorineural hearing loss, bilateral: Secondary | ICD-10-CM | POA: Diagnosis not present

## 2016-02-24 DIAGNOSIS — J383 Other diseases of vocal cords: Secondary | ICD-10-CM | POA: Diagnosis not present

## 2016-02-24 DIAGNOSIS — R490 Dysphonia: Secondary | ICD-10-CM | POA: Diagnosis not present

## 2016-02-24 DIAGNOSIS — J385 Laryngeal spasm: Secondary | ICD-10-CM | POA: Diagnosis not present

## 2016-02-29 ENCOUNTER — Telehealth: Payer: Self-pay | Admitting: Internal Medicine

## 2016-02-29 NOTE — Telephone Encounter (Signed)
Called James Noble to schedule awv appt. Left message with patient to call office to schedule appt.

## 2016-03-07 ENCOUNTER — Ambulatory Visit (INDEPENDENT_AMBULATORY_CARE_PROVIDER_SITE_OTHER): Payer: Medicare Other | Admitting: General Practice

## 2016-03-07 DIAGNOSIS — Z23 Encounter for immunization: Secondary | ICD-10-CM

## 2016-03-08 NOTE — Progress Notes (Signed)
PPD placed.  James Noble [name], MD  

## 2016-03-09 ENCOUNTER — Other Ambulatory Visit: Payer: Self-pay | Admitting: Internal Medicine

## 2016-03-09 ENCOUNTER — Encounter: Payer: Self-pay | Admitting: General Practice

## 2016-03-09 LAB — TB SKIN TEST
Induration: 0 mm
TB Skin Test: NEGATIVE

## 2016-03-21 NOTE — Telephone Encounter (Signed)
2nd Call to James Noble. Left message for patient to call office to schedule awv appt.

## 2016-03-30 DIAGNOSIS — H6123 Impacted cerumen, bilateral: Secondary | ICD-10-CM | POA: Diagnosis not present

## 2016-03-30 DIAGNOSIS — H903 Sensorineural hearing loss, bilateral: Secondary | ICD-10-CM | POA: Diagnosis not present

## 2016-04-12 NOTE — Progress Notes (Addendum)
Subjective:   James Noble is a 75 y.o. male who presents for Medicare Annual/Subsequent preventive examination.  The Patient was informed that the wellness visit is to identify future health risk and educate and initiate measures that can reduce risk for increased disease through the lifespan.   Describes health as fair, good or great? "great" Orthodontist full time  Review of Systems:  No ROS.  Medicare Wellness Visit.  Cardiac Risk Factors include: advanced age (>83men, >20 women);dyslipidemia;hypertension;male gender;family history of premature cardiovascular disease   Sleep patterns: Sleeps well.  Home Safety/Smoke Alarms:  Smoke detectors in place.  Living environment; residence and Firearm Safety: Lives with wife in 2 story home, rarely goes upstairs. Firearms locked away.  Seat Belt Safety/Bike Helmet: wears seat belt.    Counseling:   Eye Exam-last exam 1 year ago, yearly by Rolene Arbour exam 12/2015, yearly [place] and [place]   Male:   CCS-Colonoscopy 06/22/2011, hemorrhoids. Recall 5 years.      PSA-4.04, 10/14/2015.      Objective:    Vitals: BP 126/70 (BP Location: Left Arm, Patient Position: Sitting, Cuff Size: Normal)   Pulse (!) 58   Ht 5\' 11"  (1.803 m)   Wt 190 lb 1.3 oz (86.2 kg)   SpO2 97%   BMI 26.51 kg/m   Body mass index is 26.51 kg/m.  Tobacco History  Smoking Status  . Former Smoker  . Quit date: 05/01/1968  Smokeless Tobacco  . Never Used    Comment: smoked 1960-1970, up to 1 ppd     Counseling given: No   Past Medical History:  Diagnosis Date  . Cancer (HCC)    BASAL CELL , DR.LOMAX  . Hiatal hernia   . Hx of colonic polyps    DR.MEDOFF  . Hyperlipidemia    BASED ON NMR;GILBERTS SYNDROME  . Hyperuricemia without signs inflammatory arthritis/tophaceous disease 2012   uric acid 7.7  . Sciatica   . Spinal stenosis    OF LS SPINE...DR.LOVE   Past Surgical History:  Procedure Laterality Date  . colonoscopy with  polypectomy  2005 & 2008   Dr Earlean Shawl  . [place]  . INGUINAL HERNIA REPAIR  1968  . steroid spinal injections  2013   X2, Dr Ernestina Patches  . TONSILLECTOMY AND ADENOIDECTOMY  1949  . UPPER GASTROINTESTINAL ENDOSCOPY  2008   Dr Earlean Shawl; Hiatal hernia  . VASECTOMY     Family History  Problem Relation Age of Onset  . Heart attack Father 80     RHD, smoker  . Stroke Maternal Grandmother     late 51s  . Stroke Mother 61    smoker  . Stroke Maternal Uncle     X 3  . Stroke Maternal Grandfather   . Diabetes Neg Hx    History  Sexual Activity  . Sexual activity: Not on file    Outpatient Encounter Prescriptions as of 04/13/2016  Medication Sig  . amLODipine (NORVASC) 5 MG tablet TAKE TWO TABLETS EVERY DAY  . aspirin EC 81 MG tablet Take 81 mg by mouth daily.  . DOCOSAHEXAENOIC ACID PO Take 1 g by mouth daily.  . Ergocalciferol (VITAMIN D2) 400 units TABS Take 1 tablet by mouth daily.  Marland Kitchen losartan (COZAAR) 100 MG tablet TAKE ONE TABLET EACH DAY  . Multiple Vitamin (MULTI-VITAMINS) TABS Take 1 tablet by mouth daily.  . ranitidine (ZANTAC) 300 MG tablet   . rosuvastatin (CRESTOR) 20 MG tablet TAKE ONE TABLET AT BEDTIME  . [  DISCONTINUED] cyclobenzaprine (FLEXERIL) 5 MG tablet Take 1 tablet (5 mg total) by mouth 2 (two) times daily as needed for muscle spasms (may cause drowsiness). (Patient not taking: Reported on 04/13/2016)  . [DISCONTINUED] gabapentin (NEURONTIN) 300 MG capsule Take 1 capsule (300 mg total) by mouth at bedtime. (Patient not taking: Reported on 04/13/2016)  . [DISCONTINUED] naproxen (NAPROSYN) 500 MG tablet Take 1 tablet (500 mg total) by mouth 2 (two) times daily as needed (for pain, take with food). (Patient not taking: Reported on 04/13/2016)   No facility-administered encounter medications on file as of 04/13/2016.     Activities of Daily Living In your present state of health, do you have any difficulty performing the following activities: 04/13/2016    Hearing? N  Vision? N  Difficulty concentrating or making decisions? N  Walking or climbing stairs? N  Dressing or bathing? N  Doing errands, shopping? N  Preparing Food and eating ? N  Using the Toilet? N  In the past six months, have you accidently leaked urine? N  Do you have problems with loss of bowel control? N  Managing your Medications? N  Managing your Finances? N  Housekeeping or managing your Housekeeping? N  Some recent data might be hidden    Patient Care Team: Binnie Rail, MD as PCP - General (Internal Medicine) Richmond Campbell, MD as Consulting Physician (Gastroenterology) Rutherford Guys, MD as Consulting Physician (Ophthalmology)   Assessment:    Physical assessment deferred to PCP.  Exercise Activities and Dietary recommendations Current Exercise Habits: Structured exercise class, Type of exercise: strength training/weights;treadmill;exercise ball (spin class), Time (Minutes): > 60, Frequency (Times/Week): 5, Weekly Exercise (Minutes/Week): 0, Intensity: Moderate, Exercise limited by: None identified   Diet (meal preparation, eat out, water intake, caffeinated beverages, dairy products, fruits and vegetables): Drinks appropriate amount of water.   Breakfast: protein shake and bar Lunch: salad, sandwich, soup Dinner: protein, vegetables  Encouraged to continue healthy food choices and exercise routine.   Goals      Patient Stated   . <enter goal here> (pt-stated)          Maintain current health status.       Fall Risk Fall Risk  04/13/2016 10/14/2015  Falls in the past year? No No   Depression Screen PHQ 2/9 Scores 04/13/2016 10/14/2015  PHQ - 2 Score 0 0    Cognitive Function       Ad8 score reviewed for issues:  Issues making decisions:no  Less interest in hobbies / activities:no  Repeats questions, stories (family complaining):no  Trouble using ordinary gadgets (microwave, computer, phone):no  Forgets the month or year:  no  Mismanaging finances: no  Remembering appts:no  Daily problems with thinking and/or memory:no Ad8 score is=0      Immunization History  Administered Date(s) Administered  . Influenza Whole 02/13/2009  . Influenza,inj,Quad PF,36+ Mos 12/22/2015  . PPD Test 03/11/2012, 04/14/2014, 04/06/2015, 03/07/2016  . Pneumococcal Conjugate-13 04/06/2015  . Pneumococcal Polysaccharide-23 03/19/2012  . Tdap 12/05/2010   Screening Tests Health Maintenance  Topic Date Due  . TETANUS/TDAP  12/04/2020  . COLONOSCOPY  06/21/2021  . INFLUENZA VACCINE  Addressed  . ZOSTAVAX  Addressed  . PNA vac Low Risk Adult  Completed      Plan:     Continue to eat heart healthy diet (full of fruits, vegetables, whole grains, lean protein, water--limit salt, fat, and sugar intake) and increase physical activity as tolerated.  Continue doing brain stimulating activities (puzzles, reading, adult  coloring books, staying active) to keep memory sharp.   Bring a copy of your advance directives to your next office visit.   During the course of the visit the patient was educated and counseled about the following appropriate screening and preventive services:   Vaccines to include Pneumoccal, Influenza, Hepatitis B, Td, Zostavax, HCV  Cardiovascular Disease  Colorectal cancer screening  Diabetes screening  Prostate Cancer Screening  Glaucoma screening  Nutrition counseling    Patient Instructions (the written plan) was given to the patient.    Gerilyn Nestle, RN  04/13/2016   Medical screening examination/treatment/procedure(s) were performed by non-physician practitioner and as supervising physician I was immediately available for consultation/collaboration. I agree with above. Binnie Rail, MD

## 2016-04-13 ENCOUNTER — Ambulatory Visit: Payer: Medicare Other

## 2016-04-13 VITALS — BP 126/70 | HR 58 | Ht 71.0 in | Wt 190.1 lb

## 2016-04-13 DIAGNOSIS — Z0000 Encounter for general adult medical examination without abnormal findings: Secondary | ICD-10-CM

## 2016-04-13 NOTE — Patient Instructions (Addendum)
Continue to eat heart healthy diet (full of fruits, vegetables, whole grains, lean protein, water--limit salt, fat, and sugar intake) and increase physical activity as tolerated.  Continue doing brain stimulating activities (puzzles, reading, adult coloring books, staying active) to keep memory sharp.   Bring a copy of your advance directives to your next office visit.   Fall Prevention in the Home Introduction Falls can cause injuries. They can happen to people of all ages. There are many things you can do to make your home safe and to help prevent falls. What can I do on the outside of my home?  Regularly fix the edges of walkways and driveways and fix any cracks.  Remove anything that might make you trip as you walk through a door, such as a raised step or threshold.  Trim any bushes or trees on the path to your home.  Use bright outdoor lighting.  Clear any walking paths of anything that might make someone trip, such as rocks or tools.  Regularly check to see if handrails are loose or broken. Make sure that both sides of any steps have handrails.  Any raised decks and porches should have guardrails on the edges.  Have any leaves, snow, or ice cleared regularly.  Use sand or salt on walking paths during winter.  Clean up any spills in your garage right away. This includes oil or grease spills. What can I do in the bathroom?  Use night lights.  Install grab bars by the toilet and in the tub and shower. Do not use towel bars as grab bars.  Use non-skid mats or decals in the tub or shower.  If you need to sit down in the shower, use a plastic, non-slip stool.  Keep the floor dry. Clean up any water that spills on the floor as soon as it happens.  Remove soap buildup in the tub or shower regularly.  Attach bath mats securely with double-sided non-slip rug tape.  Do not have throw rugs and other things on the floor that can make you trip. What can I do in the  bedroom?  Use night lights.  Make sure that you have a light by your bed that is easy to reach.  Do not use any sheets or blankets that are too big for your bed. They should not hang down onto the floor.  Have a firm chair that has side arms. You can use this for support while you get dressed.  Do not have throw rugs and other things on the floor that can make you trip. What can I do in the kitchen?  Clean up any spills right away.  Avoid walking on wet floors.  Keep items that you use a lot in easy-to-reach places.  If you need to reach something above you, use a strong step stool that has a grab bar.  Keep electrical cords out of the way.  Do not use floor polish or wax that makes floors slippery. If you must use wax, use non-skid floor wax.  Do not have throw rugs and other things on the floor that can make you trip. What can I do with my stairs?  Do not leave any items on the stairs.  Make sure that there are handrails on both sides of the stairs and use them. Fix handrails that are broken or loose. Make sure that handrails are as long as the stairways.  Check any carpeting to make sure that it is firmly attached to the   stairs. Fix any carpet that is loose or worn.  Avoid having throw rugs at the top or bottom of the stairs. If you do have throw rugs, attach them to the floor with carpet tape.  Make sure that you have a light switch at the top of the stairs and the bottom of the stairs. If you do not have them, ask someone to add them for you. What else can I do to help prevent falls?  Wear shoes that:  Do not have high heels.  Have rubber bottoms.  Are comfortable and fit you well.  Are closed at the toe. Do not wear sandals.  If you use a stepladder:  Make sure that it is fully opened. Do not climb a closed stepladder.  Make sure that both sides of the stepladder are locked into place.  Ask someone to hold it for you, if possible.  Clearly mark and make  sure that you can see:  Any grab bars or handrails.  First and last steps.  Where the edge of each step is.  Use tools that help you move around (mobility aids) if they are needed. These include:  Canes.  Walkers.  Scooters.  Crutches.  Turn on the lights when you go into a dark area. Replace any light bulbs as soon as they burn out.  Set up your furniture so you have a clear path. Avoid moving your furniture around.  If any of your floors are uneven, fix them.  If there are any pets around you, be aware of where they are.  Review your medicines with your doctor. Some medicines can make you feel dizzy. This can increase your chance of falling. Ask your doctor what other things that you can do to help prevent falls. This information is not intended to replace advice given to you by your health care provider. Make sure you discuss any questions you have with your health care provider. Document Released: 02/11/2009 Document Revised: 09/23/2015 Document Reviewed: 05/22/2014  2017 Elsevier  Health Maintenance, Male A healthy lifestyle and preventative care can promote health and wellness.  Maintain regular health, dental, and eye exams.  Eat a healthy diet. Foods like vegetables, fruits, whole grains, low-fat dairy products, and lean protein foods contain the nutrients you need and are low in calories. Decrease your intake of foods high in solid fats, added sugars, and salt. Get information about a proper diet from your health care provider, if necessary.  Regular physical exercise is one of the most important things you can do for your health. Most adults should get at least 150 minutes of moderate-intensity exercise (any activity that increases your heart rate and causes you to sweat) each week. In addition, most adults need muscle-strengthening exercises on 2 or more days a week.   Maintain a healthy weight. The body mass index (BMI) is a screening tool to identify possible  weight problems. It provides an estimate of body fat based on height and weight. Your health care provider can find your BMI and can help you achieve or maintain a healthy weight. For males 20 years and older:  A BMI below 18.5 is considered underweight.  A BMI of 18.5 to 24.9 is normal.  A BMI of 25 to 29.9 is considered overweight.  A BMI of 30 and above is considered obese.  Maintain normal blood lipids and cholesterol by exercising and minimizing your intake of saturated fat. Eat a balanced diet with plenty of fruits and vegetables. Blood tests   for lipids and cholesterol should begin at age 20 and be repeated every 5 years. If your lipid or cholesterol levels are high, you are over age 50, or you are at high risk for heart disease, you may need your cholesterol levels checked more frequently.Ongoing high lipid and cholesterol levels should be treated with medicines if diet and exercise are not working.  If you smoke, find out from your health care provider how to quit. If you do not use tobacco, do not start.  Lung cancer screening is recommended for adults aged 55-80 years who are at high risk for developing lung cancer because of a history of smoking. A yearly low-dose CT scan of the lungs is recommended for people who have at least a 30-pack-year history of smoking and are current smokers or have quit within the past 15 years. A pack year of smoking is smoking an average of 1 pack of cigarettes a day for 1 year (for example, a 30-pack-year history of smoking could mean smoking 1 pack a day for 30 years or 2 packs a day for 15 years). Yearly screening should continue until the smoker has stopped smoking for at least 15 years. Yearly screening should be stopped for people who develop a health problem that would prevent them from having lung cancer treatment.  If you choose to drink alcohol, do not have more than 2 drinks per day. One drink is considered to be 12 oz (360 mL) of beer, 5 oz (150  mL) of wine, or 1.5 oz (45 mL) of liquor.  Avoid the use of street drugs. Do not share needles with anyone. Ask for help if you need support or instructions about stopping the use of drugs.  High blood pressure causes heart disease and increases the risk of stroke. High blood pressure is more likely to develop in:  People who have blood pressure in the end of the normal range (100-139/85-89 mm Hg).  People who are overweight or obese.  People who are African American.  If you are 18-39 years of age, have your blood pressure checked every 3-5 years. If you are 40 years of age or older, have your blood pressure checked every year. You should have your blood pressure measured twice-once when you are at a hospital or clinic, and once when you are not at a hospital or clinic. Record the average of the two measurements. To check your blood pressure when you are not at a hospital or clinic, you can use:  An automated blood pressure machine at a pharmacy.  A home blood pressure monitor.  If you are 45-79 years old, ask your health care provider if you should take aspirin to prevent heart disease.  Diabetes screening involves taking a blood sample to check your fasting blood sugar level. This should be done once every 3 years after age 45 if you are at a normal weight and without risk factors for diabetes. Testing should be considered at a younger age or be carried out more frequently if you are overweight and have at least 1 risk factor for diabetes.  Colorectal cancer can be detected and often prevented. Most routine colorectal cancer screening begins at the age of 50 and continues through age 75. However, your health care provider may recommend screening at an earlier age if you have risk factors for colon cancer. On a yearly basis, your health care provider may provide home test kits to check for hidden blood in the stool. A small camera   at the end of a tube may be used to directly examine the colon  (sigmoidoscopy or colonoscopy) to detect the earliest forms of colorectal cancer. Talk to your health care provider about this at age 50 when routine screening begins. A direct exam of the colon should be repeated every 5-10 years through age 75, unless early forms of precancerous polyps or small growths are found.  People who are at an increased risk for hepatitis B should be screened for this virus. You are considered at high risk for hepatitis B if:  You were born in a country where hepatitis B occurs often. Talk with your health care provider about which countries are considered high risk.  Your parents were born in a high-risk country and you have not received a shot to protect against hepatitis B (hepatitis B vaccine).  You have HIV or AIDS.  You use needles to inject street drugs.  You live with, or have sex with, someone who has hepatitis B.  You are a man who has sex with other men (MSM).  You get hemodialysis treatment.  You take certain medicines for conditions like cancer, organ transplantation, and autoimmune conditions.  Hepatitis C blood testing is recommended for all people born from 1945 through 1965 and any individual with known risk factors for hepatitis C.  Healthy men should no longer receive prostate-specific antigen (PSA) blood tests as part of routine cancer screening. Talk to your health care provider about prostate cancer screening.  Testicular cancer screening is not recommended for adolescents or adult males who have no symptoms. Screening includes self-exam, a health care provider exam, and other screening tests. Consult with your health care provider about any symptoms you have or any concerns you have about testicular cancer.  Practice safe sex. Use condoms and avoid high-risk sexual practices to reduce the spread of sexually transmitted infections (STIs).  You should be screened for STIs, including gonorrhea and chlamydia if:  You are sexually active and  are younger than 24 years.  You are older than 24 years, and your health care provider tells you that you are at risk for this type of infection.  Your sexual activity has changed since you were last screened, and you are at an increased risk for chlamydia or gonorrhea. Ask your health care provider if you are at risk.  If you are at risk of being infected with HIV, it is recommended that you take a prescription medicine daily to prevent HIV infection. This is called pre-exposure prophylaxis (PrEP). You are considered at risk if:  You are a man who has sex with other men (MSM).  You are a heterosexual man who is sexually active with multiple partners.  You take drugs by injection.  You are sexually active with a partner who has HIV.  Talk with your health care provider about whether you are at high risk of being infected with HIV. If you choose to begin PrEP, you should first be tested for HIV. You should then be tested every 3 months for as long as you are taking PrEP.  Use sunscreen. Apply sunscreen liberally and repeatedly throughout the day. You should seek shade when your shadow is shorter than you. Protect yourself by wearing long sleeves, pants, a wide-brimmed hat, and sunglasses year round whenever you are outdoors.  Tell your health care provider of new moles or changes in moles, especially if there is a change in shape or color. Also, tell your health care provider if a mole   is larger than the size of a pencil eraser.  A one-time screening for abdominal aortic aneurysm (AAA) and surgical repair of large AAAs by ultrasound is recommended for men aged 65-75 years who are current or former smokers.  Stay current with your vaccines (immunizations). This information is not intended to replace advice given to you by your health care provider. Make sure you discuss any questions you have with your health care provider. Document Released: 10/14/2007 Document Revised: 05/08/2014 Document  Reviewed: 01/19/2015 Elsevier Interactive Patient Education  2017 [employer]  

## 2016-04-28 ENCOUNTER — Ambulatory Visit (INDEPENDENT_AMBULATORY_CARE_PROVIDER_SITE_OTHER): Payer: Medicare Other | Admitting: Nurse Practitioner

## 2016-04-28 ENCOUNTER — Encounter: Payer: Self-pay | Admitting: Nurse Practitioner

## 2016-04-28 VITALS — BP 162/90 | HR 69 | Temp 97.8°F | Resp 12 | Ht 71.5 in | Wt 191.8 lb

## 2016-04-28 DIAGNOSIS — T2125XA Burn of second degree of buttock, initial encounter: Secondary | ICD-10-CM | POA: Diagnosis not present

## 2016-04-28 DIAGNOSIS — S39012A Strain of muscle, fascia and tendon of lower back, initial encounter: Secondary | ICD-10-CM | POA: Diagnosis not present

## 2016-04-28 DIAGNOSIS — M48061 Spinal stenosis, lumbar region without neurogenic claudication: Secondary | ICD-10-CM

## 2016-04-28 MED ORDER — NAPROXEN 500 MG PO TABS: 500.0000 mg | ORAL_TABLET | Freq: Two times a day (BID) | ORAL | 0 refills | Status: DC | PRN

## 2016-04-28 MED ORDER — CYCLOBENZAPRINE HCL 5 MG PO TABS: 5.0000 mg | ORAL_TABLET | Freq: Two times a day (BID) | ORAL | 0 refills | Status: DC | PRN

## 2016-04-28 NOTE — Patient Instructions (Signed)
Leave wound open as much as possible while at home. Do not use any ointment at this time, so wound can dry. Call office if any signs of wound infection. Shower only till wound heals.  Burn Care, Adult A burn is an injury to the skin or the tissues under the skin. There are three types of burns:  First degree. These burns may cause the skin to be red and slightly swollen.  Second degree. These burns are very painful and cause the skin to be very red. The skin may also leak fluid, look shiny, and develop blisters.  Third degree. These burns cause permanent damage. They either turn the skin white or black and make it look charred, dry, and leathery. Taking care of your burn properly can help to prevent pain and infection. It can also help the burn to heal more quickly. What are the risks? Complications from burns include:  Damage to the skin.  Reduced blood flow near the injury.  Dead tissue.  Scarring.  Problems with movement, if the burn happened near a joint or on the hands or feet. Severe burns can lead to problems that affect the whole body, such as:  Fluid loss.  Less blood circulating in the body.  Inability to maintain a normal core body temperature (thermoregulation).  Infection.  Shock.  Problems breathing. How to care for a first-degree burn Right after a burn:  Rinse or soak the burn under cool water until the pain stops. Do not put ice on your burn. This can cause more damage.  Lightly cover the burn with a sterile cloth (dressing). Burn care  Follow instructions from your health care provider about:  How to clean and take care of the burn.  When to change and remove the dressing.  Check your burn every day for signs of infection. Check for:  More redness, swelling, or pain.  Warmth.  Pus or a bad smell. Medicine  Take over-the-counter and prescription medicines only as told by your health care provider.  If you were prescribed antibiotic  medicine, take or apply it as told by your health care provider. Do not stop using the antibiotic even if your condition improves. General instructions  To prevent infection, do not put butter, oil, or other home remedies on your burn.  Do not rub your burn, even when you are cleaning it.  Protect your burn from the sun. How to care for a second-degree burn Right after a burn:  Rinse or soak the burn under cool water. Do this for several minutes. Do not put ice on your burn. This can cause more damage.  Lightly cover the burn with a sterile cloth (dressing). Burn care  Raise (elevate) the injured area above the level of your heart while sitting or lying down.  Follow instructions from your health care provider about:  How to clean and take care of the burn.  When to change and remove the dressing.  Check your burn every day for signs of infection. Check for:  More redness, swelling, or pain.  Warmth.  Pus or a bad smell. Medicine   Take over-the-counter and prescription medicines only as told by your health care provider.  If you were prescribed antibiotic medicine, take or apply it as told by your health care provider. Do not stop using the antibiotic even if your condition improves. General instructions  To prevent infection:  Do not put butter, oil, or other home remedies on the burn.  Do not scratch  or pick at the burn.  Do not break any blisters.  Do not peel skin.  Do not rub your burn, even when you are cleaning it.  Protect your burn from the sun. How to care for a third-degree burn Right after a burn:  Lightly cover the burn with gauze.  Seek immediate medical attention. Burn care  Raise (elevate) the injured area above the level of your heart while sitting or lying down.  Drink enough fluid to keep your urine clear or pale yellow.  Rest as told by your health care provider. Do not participate in sports or other physical activities until your  health care provider approves.  Follow instructions from your health care provider about:  How to clean and take care of the burn.  When to change and remove the dressing.  Check your burn every day for signs of infection. Check for:  More redness, swelling, or pain.  Warmth.  Pus or a bad smell. Medicine  Take over-the-counter and prescription medicines only as told by your health care provider.  If you were prescribed antibiotic medicine, take or apply it as told by your health care provider. Do not stop using the antibiotic even if your condition improves. General instructions  To prevent infection:  Do not put butter, oil, or other home remedies on the burn.  Do not scratch or pick at the burn.  Do not break any blisters.  Do not peel skin.  Do not rub your burn, even when you are cleaning it.  Protect your burn from the sun.  Keep all follow-up visits as told by your health care provider. This is important. Contact a health care provider if:  Your condition does not improve.  Your condition gets worse.  You have a fever.  Your burn changes in appearance or develops black or red spots.  Your burn feels warm to the touch.  Your pain is not controlled with medicine. Get help right away if:  You have redness, swelling, or pain at the site of the burn.  You have fluid, blood, or pus coming from your burn.  You have red streaks near the burn.  You have severe pain. This information is not intended to replace advice given to you by your health care provider. Make sure you discuss any questions you have with your health care provider. Document Released: 04/17/2005 Document Revised: 11/07/2015 Document Reviewed: 10/05/2015 Else[employer] Interactive Patient Education  2017 [employer] for Routine Care of Injuries Introduction Many injuries can be cared for using rest, ice, compression, and elevation (RICE therapy). Using RICE therapy can help to  lessen pain and swelling. It can help your body to heal. Rest  Reduce your normal activities and avoid using the injured part of your body. You can go back to your normal activities when you feel okay and your doctor says it is okay. Ice  Do not put ice on your bare skin.  Put ice in a plastic bag.  Place a towel between your skin and the bag.  Leave the ice on for 20 minutes, 2-3 times a day. Do this for as long as told by your doctor. Compression  Compression means putting pressure on the injured area. This can be done with an elastic bandage. If an elastic bandage has been applied:  Remove and reapply the bandage every 3-4 hours or as told by your doctor.  Make sure the bandage is not wrapped too tight. Wrap the bandage more  loosely if part of your body beyond the bandage is blue, swollen, cold, painful, or loses feeling (numb).  See your doctor if the bandage seems to make your problems worse. Elevation  Elevation means keeping the injured area raised. Raise the injured area above your heart or the center of your chest if you can. When should I get help? You should get help if:  You keep having pain and swelling.  Your symptoms get worse. Get help right away if: You should get help right away if:  You have sudden bad pain at or below the area of your injury.  You have redness or more swelling around your injury.  You have tingling or numbness at or below the injury that does not go away when you take off the bandage. This information is not intended to replace advice given to you by your health care provider. Make sure you discuss any questions you have with your health care provider. Document Released: 10/04/2007 Document Revised: 09/23/2015 Document Reviewed: 03/25/2014  2017 [employer]vier

## 2016-04-28 NOTE — Progress Notes (Signed)
Pre visit review using our clinic review tool, if applicable. No additional management support is needed unless otherwise documented below in the visit note. 

## 2016-04-28 NOTE — Progress Notes (Signed)
Subjective:  Patient ID: James Noble, male    DOB: 07-27-1940  Age: 75 y.o. MRN: [id number]  CC: Acute Visit (low back pain ongoing - pulled back out 2 days ago)   Back Pain  This is a recurrent problem. The problem occurs intermittently. The problem is unchanged. The pain is present in the gluteal. The quality of the pain is described as aching and cramping. The pain radiates to the left thigh. The pain is the same all the time. The symptoms are aggravated by lying down, sitting and twisting. Stiffness is present at night. Pertinent negatives include no abdominal pain, bladder incontinence, bowel incontinence, chest pain, dysuria, fever, headaches, leg pain, numbness, paresis, paresthesias, pelvic pain, perianal numbness, tingling, weakness or weight loss. 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 He has tried heat for the symptoms. The treatment provided mild relief.  Burn  The incident occurred 2 days ago. The burns occurred at home. Burn context: with use of heating pad. The burns were a result of contact with a hot surface. The burns are located on the left buttock. The patient is experiencing no pain. He has tried blister removal and salve for the symptoms.    Outpatient Medications Prior to Visit  Medication Sig Dispense Refill  . amLODipine (NORVASC) 5 MG tablet TAKE TWO TABLETS EVERY DAY 180 tablet 3  . aspirin EC 81 MG tablet Take 81 mg by mouth daily.    . Ergocalciferol (VITAMIN D2) 400 units TABS Take 1 tablet by mouth daily.    Marland Kitchen losartan (COZAAR) 100 MG tablet TAKE ONE TABLET EACH DAY 90 tablet 2  . Multiple Vitamin (MULTI-VITAMINS) TABS Take 1 tablet by mouth daily.    . ranitidine (ZANTAC) 300 MG tablet     . rosuvastatin (CRESTOR) 20 MG tablet TAKE ONE TABLET AT BEDTIME 90 tablet 1  . DOCOSAHEXAENOIC ACID PO Take 1 g by mouth daily.     No facility-administered medications prior to visit.     ROS See  HPI  Objective:  BP (!) 162/90   Pulse 69   Temp 97.8 F (36.6 C) (Oral)   Resp 12   Ht 5' 11.5" (1.816 m)   Wt 191 lb 12.8 oz (87 kg)   SpO2 97%   BMI 26.38 kg/m   BP Readings from Last 3 Encounters:  04/28/16 (!) 162/90  04/13/16 126/70  12/22/15 (!) 152/82    Wt Readings from Last 3 Encounters:  04/28/16 191 lb 12.8 oz (87 kg)  04/13/16 190 lb 1.3 oz (86.2 kg)  12/22/15 188 lb (85.3 kg)    Physical Exam  Constitutional: He is oriented to person, place, and time. No distress.  Neck: Normal range of motion. Neck supple.  Cardiovascular: Normal rate.   Pulmonary/Chest: Effort normal.  Musculoskeletal: He exhibits tenderness. He exhibits no edema.  Negative straight leg raise  Neurological: He is alert and oriented to person, place, and time. He has normal reflexes. No cranial nerve deficit.  Skin: Skin is warm and dry.     Vitals reviewed.   Lab Results  Component Value Date   WBC 5.8 10/14/2015   HGB 15.1 10/14/2015   HCT 44.6 10/14/2015   PLT 191.0 10/14/2015   GLUCOSE 96 10/14/2015   CHOL 134 01/13/2016   TRIG 78.0 01/13/2016   HDL 38.50 (L) 01/13/2016   LDLCALC 80 01/13/2016   ALT 21 10/14/2015   AST 22 10/14/2015   NA 140  10/14/2015   K 4.8 10/14/2015   CL 104 10/14/2015   CREATININE 0.97 10/14/2015   BUN 16 10/14/2015   CO2 30 10/14/2015   TSH 2.17 10/14/2015   PSA 4.04 (H) 10/14/2015   HGBA1C 5.2 10/14/2015    Dg Chest 2 View  Result Date: 03/12/2014 CLINICAL DATA:  Follow-up of thoracic spine osteophytes EXAM: CHEST  2 VIEW COMPARISON:  PA and lateral chest x-ray of April 22, 2004 FINDINGS: The lungs are mildly hyperinflated and clear. The heart and pulmonary vascularity are normal. There is no pleural effusion or pneumothorax. There is stable tortuosity of the descending thoracic aorta. There are stable bilateral osteophytes at T7-T8 and on the right at T9-T10. IMPRESSION: 1. There is no active cardiopulmonary disease. 2. Stable  osteophytes of the mid and lower thoracic spine. Electronically Signed   By: David  Martinique   On: 03/12/2014 08:55    Assessment & Plan:   James Noble was seen today for acute visit.  Diagnoses and all orders for this visit:  Strain of lumbar paraspinal muscle, initial encounter -     naproxen (NAPROSYN) 500 MG tablet; Take 1 tablet (500 mg total) by mouth 2 (two) times daily as needed (for pain, take with food). -     cyclobenzaprine (FLEXERIL) 5 MG tablet; Take 1 tablet (5 mg total) by mouth 2 (two) times daily as needed for muscle spasms (may cause drowsiness).  SPINAL STENOSIS, LUMBAR -     naproxen (NAPROSYN) 500 MG tablet; Take 1 tablet (500 mg total) by mouth 2 (two) times daily as needed (for pain, take with food). -     cyclobenzaprine (FLEXERIL) 5 MG tablet; Take 1 tablet (5 mg total) by mouth 2 (two) times daily as needed for muscle spasms (may cause drowsiness).  Second degree burn of buttock, initial encounter   I am having James Noble start on naproxen and cyclobenzaprine. I am also having him maintain his ranitidine, amLODipine, DOCOSAHEXAENOIC ACID PO, MULTI-VITAMINS, aspirin EC, Vitamin D2, losartan, and rosuvastatin.  Meds ordered this encounter  Medications  . naproxen (NAPROSYN) 500 MG tablet    Sig: Take 1 tablet (500 mg total) by mouth 2 (two) times daily as needed (for pain, take with food).    Dispense:  30 tablet    Refill:  0    Order Specific Question:   Supervising Provider    Answer:   Cassandria Anger [[id number]]  . cyclobenzaprine (FLEXERIL) 5 MG tablet    Sig: Take 1 tablet (5 mg total) by mouth 2 (two) times daily as needed for muscle spasms (may cause drowsiness).    Dispense:  20 tablet    Refill:  0    Order Specific Question:   Supervising Provider    Answer:   Cassandria Anger [[id number]]    Follow-up: Return if symptoms worsen or fail to improve.  Wilfred Lacy, NP

## 2016-05-24 DIAGNOSIS — Z1211 Encounter for screening for malignant neoplasm of colon: Secondary | ICD-10-CM | POA: Diagnosis not present

## 2016-05-24 DIAGNOSIS — Z86010 Personal history of colonic polyps: Secondary | ICD-10-CM | POA: Diagnosis not present

## 2016-05-24 DIAGNOSIS — K644 Residual hemorrhoidal skin tags: Secondary | ICD-10-CM | POA: Diagnosis not present

## 2016-06-08 DIAGNOSIS — Z8719 Personal history of other diseases of the digestive system: Secondary | ICD-10-CM | POA: Diagnosis not present

## 2016-06-08 DIAGNOSIS — Z86010 Personal history of colonic polyps: Secondary | ICD-10-CM | POA: Diagnosis not present

## 2016-06-08 DIAGNOSIS — Z9889 Other specified postprocedural states: Secondary | ICD-10-CM | POA: Diagnosis not present

## 2016-06-08 DIAGNOSIS — Z1211 Encounter for screening for malignant neoplasm of colon: Secondary | ICD-10-CM | POA: Diagnosis not present

## 2016-06-08 DIAGNOSIS — K648 Other hemorrhoids: Secondary | ICD-10-CM | POA: Diagnosis not present

## 2016-06-16 DIAGNOSIS — K648 Other hemorrhoids: Secondary | ICD-10-CM | POA: Diagnosis not present

## 2016-06-26 DIAGNOSIS — H6123 Impacted cerumen, bilateral: Secondary | ICD-10-CM | POA: Diagnosis not present

## 2016-06-26 DIAGNOSIS — H903 Sensorineural hearing loss, bilateral: Secondary | ICD-10-CM | POA: Diagnosis not present

## 2016-06-28 DIAGNOSIS — K648 Other hemorrhoids: Secondary | ICD-10-CM | POA: Diagnosis not present

## 2016-07-17 DIAGNOSIS — H0015 Chalazion left lower eyelid: Secondary | ICD-10-CM | POA: Diagnosis not present

## 2016-07-24 DIAGNOSIS — H0015 Chalazion left lower eyelid: Secondary | ICD-10-CM | POA: Diagnosis not present

## 2016-08-28 DIAGNOSIS — H903 Sensorineural hearing loss, bilateral: Secondary | ICD-10-CM | POA: Diagnosis not present

## 2016-08-28 DIAGNOSIS — H6123 Impacted cerumen, bilateral: Secondary | ICD-10-CM | POA: Diagnosis not present

## 2016-09-04 ENCOUNTER — Other Ambulatory Visit: Payer: Self-pay | Admitting: Internal Medicine

## 2016-10-10 ENCOUNTER — Other Ambulatory Visit: Payer: Self-pay | Admitting: Internal Medicine

## 2016-10-19 ENCOUNTER — Ambulatory Visit: Payer: Medicare Other | Admitting: Internal Medicine

## 2016-10-24 DIAGNOSIS — H6123 Impacted cerumen, bilateral: Secondary | ICD-10-CM | POA: Diagnosis not present

## 2016-10-24 DIAGNOSIS — H903 Sensorineural hearing loss, bilateral: Secondary | ICD-10-CM | POA: Diagnosis not present

## 2016-11-06 ENCOUNTER — Other Ambulatory Visit: Payer: Self-pay | Admitting: Internal Medicine

## 2016-11-07 ENCOUNTER — Other Ambulatory Visit: Payer: Self-pay | Admitting: Internal Medicine

## 2016-11-14 ENCOUNTER — Other Ambulatory Visit: Payer: Self-pay | Admitting: Internal Medicine

## 2016-11-15 ENCOUNTER — Telehealth: Payer: Self-pay | Admitting: *Deleted

## 2016-11-15 NOTE — Telephone Encounter (Signed)
Per Dr Marlou Porch pt is requesting a New Pt appt to establish care.  Left message at home number to call back to schedule.  Would need to know if pt is having s/s or problems that he needs to be worked into the first available appt with any provider or if he can/wants to wait until Dr Marlou Porch has an appt available.

## 2016-11-16 NOTE — Telephone Encounter (Signed)
Follow up Call : James Noble returned your call about getting in w/ JamesSkains , he is wanting to wait for an appt w/ James Noble and I have scheduled him for 01/29/17 at 10am. Thanks

## 2016-12-11 ENCOUNTER — Ambulatory Visit: Payer: Medicare Other | Admitting: Internal Medicine

## 2016-12-14 DIAGNOSIS — E7800 Pure hypercholesterolemia, unspecified: Secondary | ICD-10-CM | POA: Diagnosis not present

## 2016-12-14 DIAGNOSIS — M48061 Spinal stenosis, lumbar region without neurogenic claudication: Secondary | ICD-10-CM | POA: Diagnosis not present

## 2016-12-14 DIAGNOSIS — Z125 Encounter for screening for malignant neoplasm of prostate: Secondary | ICD-10-CM | POA: Diagnosis not present

## 2016-12-14 DIAGNOSIS — I10 Essential (primary) hypertension: Secondary | ICD-10-CM | POA: Diagnosis not present

## 2016-12-14 DIAGNOSIS — K219 Gastro-esophageal reflux disease without esophagitis: Secondary | ICD-10-CM | POA: Diagnosis not present

## 2016-12-21 DIAGNOSIS — H903 Sensorineural hearing loss, bilateral: Secondary | ICD-10-CM | POA: Diagnosis not present

## 2016-12-21 DIAGNOSIS — H6123 Impacted cerumen, bilateral: Secondary | ICD-10-CM | POA: Diagnosis not present

## 2017-01-29 ENCOUNTER — Ambulatory Visit (INDEPENDENT_AMBULATORY_CARE_PROVIDER_SITE_OTHER): Payer: Medicare Other | Admitting: Cardiology

## 2017-01-29 ENCOUNTER — Encounter: Payer: Self-pay | Admitting: Cardiology

## 2017-01-29 VITALS — BP 128/74 | HR 62 | Ht 71.0 in | Wt 196.0 lb

## 2017-01-29 DIAGNOSIS — E785 Hyperlipidemia, unspecified: Secondary | ICD-10-CM

## 2017-01-29 DIAGNOSIS — I998 Other disorder of circulatory system: Secondary | ICD-10-CM

## 2017-01-29 DIAGNOSIS — Z0389 Encounter for observation for other suspected diseases and conditions ruled out: Secondary | ICD-10-CM | POA: Diagnosis not present

## 2017-01-29 DIAGNOSIS — R9431 Abnormal electrocardiogram [ECG] [EKG]: Secondary | ICD-10-CM

## 2017-01-29 DIAGNOSIS — I10 Essential (primary) hypertension: Secondary | ICD-10-CM | POA: Diagnosis not present

## 2017-01-29 DIAGNOSIS — Z8249 Family history of ischemic heart disease and other diseases of the circulatory system: Secondary | ICD-10-CM | POA: Diagnosis not present

## 2017-01-29 DIAGNOSIS — Z87891 Personal history of nicotine dependence: Secondary | ICD-10-CM

## 2017-01-29 NOTE — Patient Instructions (Signed)
Medication Instructions:  The current medical regimen is effective;  continue present plan and medications.  Testing/Procedures: Your physician has requested that you have a myoview. For further information please visit [email or web address]. Please follow instruction sheet, as given.  Follow-Up: Follow up as needed after testing.  Thank you for choosing [facility]!!

## 2017-01-29 NOTE — Progress Notes (Signed)
Cardiology Office Note:    Date:  01/29/2017   ID:  [name], DOB 11/28/1940, MRN [id number]  PCP:  Lavone Orn, MD  Cardiologist:  Candee Furbish, MD    Referring MD: Binnie Rail, MD     History of Present Illness:    James Noble is a 76 y.o. male prior patient of Dr. Linna Darner here for prevascular evaluation at the request of Dr. Celso Amy.  He has a family history positive for stroke, maternal he, 3 maternal uncles, maternal grandmother, maternal grandfather and mother had strokes in their 1s. His father did have a heart attack at age 75. James Noble was in [place] in [employer]. He flew out. [place].   He is a former smoker from 1960-1970 previously 1 pack day.  He still works part time for his son, [employer], and works on Wednesdays and [place]. 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 James Noble his wife.  EKG demonstrated) block with T-wave inversions noted in the inferior leads. Check a nuclear stress test to ensure no signs of ischemia.   Past Medical History:  Diagnosis Date  . Cancer (HCC)    BASAL CELL , DR.LOMAX  . Hiatal hernia   . Hx of colonic polyps    DR.MEDOFF  . Hyperlipidemia    BASED ON NMR;GILBERTS SYNDROME  . Hyperuricemia without signs inflammatory arthritis/tophaceous disease 2012   uric acid 7.7  . Sciatica   . Spinal stenosis    OF LS SPINE...DR.LOVE    Past Surgical History:  Procedure Laterality Date  . colonoscopy with polypectomy  2005 & 2008   Dr Earlean Shawl  . [place]  . INGUINAL HERNIA REPAIR  1968  . steroid spinal injections  2013   X2, Dr Ernestina Patches  . TONSILLECTOMY AND ADENOIDECTOMY  1949  . UPPER GASTROINTESTINAL ENDOSCOPY  2008   Dr Earlean Shawl; Hiatal hernia  . VASECTOMY      Current Medications: Current Meds  Medication Sig  . amLODipine (NORVASC) 5 MG tablet Take 2 tablets (10 mg total) by mouth daily. Overdue for annual appt w/labs must see  provider for refills  . aspirin EC 81 MG tablet Take 81 mg by mouth daily.  . cyclobenzaprine (FLEXERIL) 5 MG tablet Take 1 tablet (5 mg total) by mouth 2 (two) times daily as needed for muscle spasms (may cause drowsiness).  . Ergocalciferol (VITAMIN D2) 400 units TABS Take 1 tablet by mouth daily.  Marland Kitchen losartan (COZAAR) 100 MG tablet Take 1 tablet (100 mg total) by mouth daily. Must keep August appt for future refills  . Multiple Vitamin (MULTI-VITAMINS) TABS Take 1 tablet by mouth daily.  . naproxen (NAPROSYN) 500 MG tablet Take 1 tablet (500 mg total) by mouth 2 (two) times daily as needed (for pain, take with food).  . ranitidine (ZANTAC) 300 MG tablet Take 150 mg by mouth daily as needed for heartburn.   . rosuvastatin (CRESTOR) 20 MG tablet TAKE ONE TABLET AT BEDTIME     Allergies:   Dexlansoprazole and Nexium [esomeprazole magnesium]   Social History   Social History  . Marital status: Married    Spouse name: N/A  . Number of children: N/A  . Years of education: N/A   Occupational History  . ORTHODONTIST    Social History Main Topics  . Smoking status: Former Smoker    Quit date: 05/01/1968  . Smokeless tobacco:  Never Used     Comment: smoked 1960-1970, up to 1 ppd  . Alcohol use 8.4 oz/week    14 Glasses of wine per week     Comment:  socially  . Drug use: No  . Sexual activity: Not Asked   Other Topics Concern  . None   Social History Narrative   HEART HEALTHY DIET   EXERCISES 4-6 TIME WEEKLY     Family History: The patient's family history includes Heart attack (age of onset: 98) in his father; Stroke in his maternal grandfather, maternal grandmother, and maternal uncle; Stroke (age of onset: 51) in his mother. There is no history of Diabetes.   ROS:   Please see the history of present illness.   Denies any bleeding, no syncope  All other systems reviewed and are negative.  EKGs/Labs/Other Studies Reviewed:    The following studies were reviewed  today: Prior lab work, prior EKG reviewed  EKG:  EKG is  ordered today.  The ekg ordered today demonstrates 01/29/17-sinus rhythm 62 right bundle branch block with T-wave inversion inferiorly, consider inferior ischemia. Prior EKG personally reviewed from 12/05/10-) block is new as well as T-wave inversion inferiorly.  Recent Labs: No results found for requested labs within last 8760 hours.  Recent Lipid Panel    Component Value Date/Time   CHOL 134 01/13/2016 0738   CHOL 138 10/22/2014 0737   TRIG 78.0 01/13/2016 0738   TRIG 115 10/22/2014 0737   TRIG 98 03/19/2006 0000   HDL 38.50 (L) 01/13/2016 0738   HDL 40 10/22/2014 0737   CHOLHDL 3 01/13/2016 0738   VLDL 15.6 01/13/2016 0738   LDLCALC 80 01/13/2016 0738   LDLCALC 75 10/22/2014 0737    Physical Exam:    VS:  BP 128/74 (BP Location: Left Arm)   Pulse 62   Ht 5\' 11"  (1.803 m)   Wt 196 lb (88.9 kg)   BMI 27.34 kg/m     Wt Readings from Last 3 Encounters:  01/29/17 196 lb (88.9 kg)  04/28/16 191 lb 12.8 oz (87 kg)  04/13/16 190 lb 1.3 oz (86.2 kg)     GEN:  Well nourished, well developed in no acute distress HEENT: Normal NECK: No JVD; No carotid bruits LYMPHATICS: No lymphadenopathy CARDIAC: RRR, no murmurs, rubs, gallops RESPIRATORY:  Clear to auscultation without rales, wheezing or rhonchi  ABDOMEN: Soft, non-tender, non-distended MUSCULOSKELETAL:  No edema; No deformity  SKIN: Warm and dry NEUROLOGIC:  Alert and oriented x 3 PSYCHIATRIC:  Normal affect   ASSESSMENT:    1. Abnormal EKG   2. Essential hypertension   3. Hyperlipidemia, unspecified hyperlipidemia type   4. Family history of heart disease   5. Former cigarette smoker   6. Ischemia   7. T wave inversion in electrocardiogram   8. Observation for suspected cardiovascular disease    PLAN:    In order of problems listed above:  Abnormal EKG/right bundle branch block  - His T-wave inversions are noted in inferior leads as well as V3 possibly  indicative of ischemia. He has not noticed any significant changes in his exercise routine, no chest pain, no shortness of breath. He does have a strong family history of CAD with his father having MI at age 75. He used to smoke many years ago for 10 years. I would like for him to get evaluated with nuclear stress test to exclude any high risk ischemia.  Essential hypertension  - Currently well controlled.  Hyperlipidemia -  Excellent use of Crestor. LDL 90, triglycerides 282. Also on fish oil.  With a lengthy discussion about primary prevention, aspirin. I think it would be prudent for [place] to continue this workup and if low risk or normal, it would be reasonable for him to stop his aspirin for primary prevention.  Medication Adjustments/Labs and Tests Ordered: Current medicines are reviewed at length with the patient today.  Concerns regarding medicines are outlined above.  Orders Placed This Encounter  Procedures  . MYOCARDIAL PERFUSION IMAGING  . EKG 12-Lead   No orders of the defined types were placed in this encounter.   Signed, Candee Furbish, MD  01/29/2017 11:18 AM    [place]

## 2017-02-05 ENCOUNTER — Telehealth (HOSPITAL_COMMUNITY): Payer: Self-pay | Admitting: *Deleted

## 2017-02-05 NOTE — Telephone Encounter (Signed)
Left message on voicemail per DPR in reference to upcoming appointment scheduled on 02/08/17 with detailed instructions given per Myocardial Perfusion Study Information Sheet for the test. LM to arrive 15 minutes early, and that it is imperative to arrive on time for appointment to keep from having the test rescheduled. If you need to cancel or reschedule your appointment, please call the office within 24 hours of your appointment. Failure to do so may result in a cancellation of your appointment, and a $50 no show fee. Phone number given for call back for any questions. Kirstie Peri

## 2017-02-08 ENCOUNTER — Ambulatory Visit (HOSPITAL_COMMUNITY): Payer: Medicare Other | Attending: Cardiovascular Disease

## 2017-02-08 DIAGNOSIS — I4510 Unspecified right bundle-branch block: Secondary | ICD-10-CM | POA: Insufficient documentation

## 2017-02-08 DIAGNOSIS — R9439 Abnormal result of other cardiovascular function study: Secondary | ICD-10-CM | POA: Insufficient documentation

## 2017-02-08 DIAGNOSIS — Z23 Encounter for immunization: Secondary | ICD-10-CM | POA: Diagnosis not present

## 2017-02-08 DIAGNOSIS — Z8249 Family history of ischemic heart disease and other diseases of the circulatory system: Secondary | ICD-10-CM | POA: Insufficient documentation

## 2017-02-08 DIAGNOSIS — Z87891 Personal history of nicotine dependence: Secondary | ICD-10-CM

## 2017-02-08 DIAGNOSIS — I10 Essential (primary) hypertension: Secondary | ICD-10-CM | POA: Diagnosis not present

## 2017-02-08 DIAGNOSIS — I998 Other disorder of circulatory system: Secondary | ICD-10-CM | POA: Diagnosis not present

## 2017-02-08 DIAGNOSIS — E785 Hyperlipidemia, unspecified: Secondary | ICD-10-CM | POA: Diagnosis not present

## 2017-02-08 DIAGNOSIS — R9431 Abnormal electrocardiogram [ECG] [EKG]: Secondary | ICD-10-CM

## 2017-02-08 DIAGNOSIS — Z0389 Encounter for observation for other suspected diseases and conditions ruled out: Secondary | ICD-10-CM

## 2017-02-08 LAB — MYOCARDIAL PERFUSION IMAGING
Estimated workload: 11.8 METS
Exercise duration (min): 10 min
Exercise duration (sec): 5 s
LV dias vol: 134 mL (ref 62–150)
LV sys vol: 58 mL
MPHR: 146 {beats}/min
Peak HR: 146 {beats}/min
Percent HR: 100 %
RATE: 0.31
RPE: 15
Rest HR: 69 {beats}/min
SDS: 2
SRS: 6
SSS: 8
TID: 0.9

## 2017-02-08 MED ORDER — TECHNETIUM TC 99M TETROFOSMIN IV KIT
10.7000 | PACK | Freq: Once | INTRAVENOUS | Status: AC | PRN
  Administered 2017-02-08: 10.7 via INTRAVENOUS
  Filled 2017-02-08: qty 11

## 2017-02-08 MED ORDER — TECHNETIUM TC 99M TETROFOSMIN IV KIT
32.8000 | PACK | Freq: Once | INTRAVENOUS | Status: AC | PRN
  Administered 2017-02-08: 32.8 via INTRAVENOUS
  Filled 2017-02-08: qty 33

## 2017-02-15 DIAGNOSIS — L814 Other melanin hyperpigmentation: Secondary | ICD-10-CM | POA: Diagnosis not present

## 2017-02-15 DIAGNOSIS — L821 Other seborrheic keratosis: Secondary | ICD-10-CM | POA: Diagnosis not present

## 2017-02-15 DIAGNOSIS — L720 Epidermal cyst: Secondary | ICD-10-CM | POA: Diagnosis not present

## 2017-02-15 DIAGNOSIS — L819 Disorder of pigmentation, unspecified: Secondary | ICD-10-CM | POA: Diagnosis not present

## 2017-02-15 DIAGNOSIS — D2271 Melanocytic nevi of right lower limb, including hip: Secondary | ICD-10-CM | POA: Diagnosis not present

## 2017-02-15 DIAGNOSIS — Z85828 Personal history of other malignant neoplasm of skin: Secondary | ICD-10-CM | POA: Diagnosis not present

## 2017-02-15 DIAGNOSIS — D1801 Hemangioma of skin and subcutaneous tissue: Secondary | ICD-10-CM | POA: Diagnosis not present

## 2017-02-15 DIAGNOSIS — L218 Other seborrheic dermatitis: Secondary | ICD-10-CM | POA: Diagnosis not present

## 2017-02-15 DIAGNOSIS — L570 Actinic keratosis: Secondary | ICD-10-CM | POA: Diagnosis not present

## 2017-03-12 DIAGNOSIS — Z111 Encounter for screening for respiratory tuberculosis: Secondary | ICD-10-CM | POA: Diagnosis not present

## 2017-04-02 DIAGNOSIS — H6123 Impacted cerumen, bilateral: Secondary | ICD-10-CM | POA: Diagnosis not present

## 2017-04-02 DIAGNOSIS — H903 Sensorineural hearing loss, bilateral: Secondary | ICD-10-CM | POA: Diagnosis not present

## 2017-04-18 NOTE — Progress Notes (Deleted)
Subjective:   James Noble is a 76 y.o. male who presents for Medicare Annual/Subsequent preventive examination.  Review of Systems:  No ROS.  Medicare Wellness Visit. Additional risk factors are reflected in the social history.    Sleep patterns: {SX; SLEEP PATTERNS:18802::"feels rested on waking","does not get up to void","gets up *** times nightly to void","sleeps *** hours nightly"}.    Home Safety/Smoke Alarms: Feels safe in home. Smoke alarms in place.  Living environment; residence and Firearm Safety: {Rehab home environment / accessibility:30080::"no firearms","firearms stored safely"}. Seat Belt Safety/Bike Helmet: Wears seat belt.     Objective:    Vitals: There were no vitals taken for this visit.  There is no height or weight on file to calculate BMI.  Advanced Directives 04/13/2016  Does Patient Have a Medical Advance Directive? Yes  Type of Paramedic of [place];Living will  Does patient want to make changes to medical advance directive? No - Patient declined  Copy of [place] in Chart? No - copy requested    Tobacco Social History   Tobacco Use  Smoking Status Former Smoker  . Last attempt to quit: 05/01/1968  . Years since quitting: 48.9  Smokeless Tobacco Never Used  Tobacco Comment   smoked 1960-1970, up to 1 ppd     Counseling given: Not Answered Comment: smoked 1960-1970, up to 1 ppd  Past Medical History:  Diagnosis Date  . Cancer (HCC)    BASAL CELL , DR.LOMAX  . Hiatal hernia   . Hx of colonic polyps    DR.MEDOFF  . Hyperlipidemia    BASED ON NMR;GILBERTS SYNDROME  . Hyperuricemia without signs inflammatory arthritis/tophaceous disease 2012   uric acid 7.7  . Sciatica   . Spinal stenosis    OF LS SPINE...DR.LOVE   Past Surgical History:  Procedure Laterality Date  . colonoscopy with polypectomy  2005 & 2008   Dr Earlean Shawl  . [place]  . INGUINAL HERNIA REPAIR  1968  .  steroid spinal injections  2013   X2, Dr Ernestina Patches  . TONSILLECTOMY AND ADENOIDECTOMY  1949  . UPPER GASTROINTESTINAL ENDOSCOPY  2008   Dr Earlean Shawl; Hiatal hernia  . VASECTOMY     Family History  Problem Relation Age of Onset  . Heart attack Father 74        RHD, smoker  . Stroke Maternal Grandmother        late 43s  . Stroke Mother 35       smoker  . Stroke Maternal Uncle        X 3  . Stroke Maternal Grandfather   . Diabetes Neg Hx    Social History   Socioeconomic History  . Marital status: Married    Spouse name: Not on file  . Number of children: Not on file  . Years of education: Not on file  . Highest education level: Not on file  Social Needs  . Financial resource strain: Not on file  . Food insecurity - worry: Not on file  . Food insecurity - inability: Not on file  . Transportation needs - medical: Not on file  . Transportation needs - non-medical: Not on file  Occupational History  . Occupation: ORTHODONTIST  Tobacco Use  . Smoking status: Former Smoker    Last attempt to quit: 05/01/1968    Years since quitting: 48.9  . Smokeless tobacco: Never Used  . Tobacco comment: smoked 1960-1970, up to 1 ppd  Substance and Sexual Activity  . Alcohol use: Yes    Alcohol/week: 8.4 oz    Types: 14 Glasses of Donnamaria Shands per week    Comment:  socially  . Drug use: No  . Sexual activity: Not on file  Other Topics Concern  . Not on file  Social History Narrative   HEART HEALTHY DIET   EXERCISES 4-6 TIME WEEKLY    Outpatient Encounter Medications as of 04/19/2017  Medication Sig  . amLODipine (NORVASC) 5 MG tablet Take 2 tablets (10 mg total) by mouth daily. Overdue for annual appt w/labs must see provider for refills  . aspirin EC 81 MG tablet Take 81 mg by mouth daily.  . cyclobenzaprine (FLEXERIL) 5 MG tablet Take 1 tablet (5 mg total) by mouth 2 (two) times daily as needed for muscle spasms (may cause drowsiness).  . Ergocalciferol (VITAMIN D2) 400 units TABS Take 1  tablet by mouth daily.  Marland Kitchen losartan (COZAAR) 100 MG tablet Take 1 tablet (100 mg total) by mouth daily. Must keep August appt for future refills  . Multiple Vitamin (MULTI-VITAMINS) TABS Take 1 tablet by mouth daily.  . naproxen (NAPROSYN) 500 MG tablet Take 1 tablet (500 mg total) by mouth 2 (two) times daily as needed (for pain, take with food).  . ranitidine (ZANTAC) 300 MG tablet Take 150 mg by mouth daily as needed for heartburn.   . rosuvastatin (CRESTOR) 20 MG tablet TAKE ONE TABLET AT BEDTIME   No facility-administered encounter medications on file as of 04/19/2017.     Activities of Daily Living No flowsheet data found.  Patient Care Team: Lavone Orn, MD as PCP - General (Internal Medicine) Richmond Campbell, MD as Consulting Physician (Gastroenterology) Rutherford Guys, MD as Consulting Physician (Ophthalmology)   Assessment:   This is a routine wellness examination for James Noble. Physical assessment deferred to PCP.   Exercise Activities and Dietary recommendations   Diet (meal preparation, eat out, water intake, caffeinated beverages, dairy products, fruits and vegetables): {Desc; diets:16563}   Goals    None      Fall Risk Fall Risk  04/13/2016 10/14/2015  Falls in the past year? No No    Depression Screen PHQ 2/9 Scores 04/13/2016 10/14/2015  PHQ - 2 Score 0 0    Cognitive Function        Immunization History  Administered Date(s) Administered  . Influenza Whole 02/13/2009  . Influenza,inj,Quad PF,6+ Mos 12/22/2015  . PPD Test 03/11/2012, 04/14/2014, 04/06/2015, 03/07/2016  . Pneumococcal Conjugate-13 04/06/2015  . Pneumococcal Polysaccharide-23 03/19/2012  . Tdap 12/05/2010   Screening Tests Health Maintenance  Topic Date Due  . INFLUENZA VACCINE  11/29/2016  . TETANUS/TDAP  12/04/2020  . COLONOSCOPY  06/21/2021  . PNA vac Low Risk Adult  Completed      Plan:     I have personally reviewed and noted the following in the patient's chart:    . Medical and social history . Use of alcohol, tobacco or illicit drugs  . Current medications and supplements . Functional ability and status . Nutritional status . Physical activity . Advanced directives . List of other physicians . Vitals . Screenings to include cognitive, depression, and falls . Referrals and appointments  In addition, I have reviewed and discussed with patient certain preventive protocols, quality metrics, and best practice recommendations. A written personalized care plan for preventive services as well as general preventive health recommendations were provided to patient.     Michiel Cowboy, RN  04/18/2017

## 2017-04-19 ENCOUNTER — Ambulatory Visit: Payer: Medicare Other

## 2017-05-28 DIAGNOSIS — H903 Sensorineural hearing loss, bilateral: Secondary | ICD-10-CM | POA: Diagnosis not present

## 2017-05-28 DIAGNOSIS — H6123 Impacted cerumen, bilateral: Secondary | ICD-10-CM | POA: Diagnosis not present

## 2017-06-19 DIAGNOSIS — E538 Deficiency of other specified B group vitamins: Secondary | ICD-10-CM | POA: Diagnosis not present

## 2017-06-19 DIAGNOSIS — E7800 Pure hypercholesterolemia, unspecified: Secondary | ICD-10-CM | POA: Diagnosis not present

## 2017-06-19 DIAGNOSIS — K219 Gastro-esophageal reflux disease without esophagitis: Secondary | ICD-10-CM | POA: Diagnosis not present

## 2017-06-19 DIAGNOSIS — Z79899 Other long term (current) drug therapy: Secondary | ICD-10-CM | POA: Diagnosis not present

## 2017-06-19 DIAGNOSIS — Z1389 Encounter for screening for other disorder: Secondary | ICD-10-CM | POA: Diagnosis not present

## 2017-06-19 DIAGNOSIS — Z23 Encounter for immunization: Secondary | ICD-10-CM | POA: Diagnosis not present

## 2017-06-19 DIAGNOSIS — Z0000 Encounter for general adult medical examination without abnormal findings: Secondary | ICD-10-CM | POA: Diagnosis not present

## 2017-06-19 DIAGNOSIS — Z5181 Encounter for therapeutic drug level monitoring: Secondary | ICD-10-CM | POA: Diagnosis not present

## 2017-06-19 DIAGNOSIS — I10 Essential (primary) hypertension: Secondary | ICD-10-CM | POA: Diagnosis not present

## 2017-07-23 DIAGNOSIS — H6123 Impacted cerumen, bilateral: Secondary | ICD-10-CM | POA: Diagnosis not present

## 2017-07-23 DIAGNOSIS — H903 Sensorineural hearing loss, bilateral: Secondary | ICD-10-CM | POA: Diagnosis not present

## 2017-08-15 DIAGNOSIS — M62830 Muscle spasm of back: Secondary | ICD-10-CM | POA: Diagnosis not present

## 2017-08-31 DIAGNOSIS — H43393 Other vitreous opacities, bilateral: Secondary | ICD-10-CM | POA: Diagnosis not present

## 2017-08-31 DIAGNOSIS — H2513 Age-related nuclear cataract, bilateral: Secondary | ICD-10-CM | POA: Diagnosis not present

## 2017-09-03 ENCOUNTER — Ambulatory Visit (INDEPENDENT_AMBULATORY_CARE_PROVIDER_SITE_OTHER): Payer: Medicare Other | Admitting: Sports Medicine

## 2017-09-03 ENCOUNTER — Encounter: Payer: Self-pay | Admitting: Sports Medicine

## 2017-09-03 ENCOUNTER — Ambulatory Visit
Admission: RE | Admit: 2017-09-03 | Discharge: 2017-09-03 | Disposition: A | Discharge status: Home / Self Care | Payer: Medicare Other | Source: Ambulatory Visit | Attending: Sports Medicine | Admitting: Sports Medicine

## 2017-09-03 VITALS — BP 130/70 | Ht 71.0 in | Wt 180.0 lb

## 2017-09-03 DIAGNOSIS — G8929 Other chronic pain: Secondary | ICD-10-CM

## 2017-09-03 DIAGNOSIS — R200 Anesthesia of skin: Secondary | ICD-10-CM | POA: Diagnosis not present

## 2017-09-03 DIAGNOSIS — M545 Low back pain: Secondary | ICD-10-CM

## 2017-09-03 MED ORDER — GABAPENTIN 300 MG PO CAPS: 300.0000 mg | ORAL_CAPSULE | Freq: Every evening | ORAL | 1 refills | Status: DC | PRN

## 2017-09-03 NOTE — Progress Notes (Addendum)
Date of Visit: 09/03/2017   HPI:  James Noble is a 77 y.o. male with a PMH significant for left sided lumbar stenosis and reported lumbar OA who p/w chronic lower back pain. He reports having episodic back pain for approximately 20 years. The current episode began in September 2018 following a long hike. He has had persistent pain in the right side of his lower back that radiates into the gluteal region. He says that it feels similar to the lumbar stenosis that he had on the left side, however, it does not radiate down the leg. He describes the feeling as a sharp, sometimes burning pain. It is worse with prolonged standing or walking. Sitting tends to help the pain go away. He has been taking aleve daily (2 tablets 3x/day), which takes the edge off the pain. He has tried flexeril for about 10 days and noted mild improvement, though he discontinued it due to fatigue during the day. He has also been working out 4-6 times a week which has seemed to help. He denies any history of trauma to the spine or surrounding musculature. He denies significant fatigue, weight loss, or nightsweats. He also denies incontinence, saddle paresthesias, or paresthesias of the right lower extremity.   ROS: See HPI.   PHYSICAL EXAM: BP 130/70   Ht 5\' 11"  (1.803 m)   Wt 180 lb (81.6 kg)   BMI 25.10 kg/m  Gen: Well appearing, no acute distress.  HEENT: EOMI Neuro: Normal gait observed. Patellar reflex 2+ bilaterally. Plantar reflex 1+ bilaterally. 5/5 strength throughout lower extremities. Sensation grossly intact.  MSK (spine): No gross abnormalities noted on inspection. No tenderness with palpation of the spinous processes or paraspinal musculature. Flexion: full ROM, no pain; Extension: limited ROM, moderate pain lateral to R sacrum radiating to gluteal region; Lateral Flexion: full ROM, mild pain in R sacral region. Negative SLR. No pain with FABER.   ASSESSMENT/PLAN:  James Noble is a 77 y.o. male with a  history of left sided lumbar stenosis who presents with chronic right-sided back pain that starts lateral to the sacrum and radiates into the gluteal region. He reports that imaging from 2013 indicated OA of his lumbar spine, but has not had any imaging since. He does not report true radicular symptoms radiating down the right lower limb. Given his known OA and his description of burning pain in the gluteal region, facet mediated pathology is likely. Discussed a plan to obtain updated plain films of the lumbar spine. Given prior history of stenosis, patient was also interested in obtaining an MRI to further evaluate for lumbar stenosis on the right side. Recommended initiating gabapentin 300mg  qhs to alleviate symptoms. At patient's request, also provided a lumbar support brace at this visit for use with extended standing or walking. Will plan to follow-up 1-2 days after MRI is obtained to discuss findings and evaluate effects of gabapentin.    FOLLOW UP: Follow up 1-2 days after MRI is obtained to discuss results.    Reinaldo Raddle, Medical Student  Patient seen and evaluated with the medical student. I agree with the above plan of care. Patient would be interested in repeat spinal injections if indicated. We will get updated imaging in anticipation of referring him for injections, possibly facet injections. In the meantime, he will try gabapentin at night and we will give him a lumbar brace to wear as needed. Patient and his wife are in agreement with this plan.  Addendum: X-rays reviewed. Chronic degenerative  changes are seen, especially at the L5-S1 level. Nothing acute.

## 2017-09-10 DIAGNOSIS — K219 Gastro-esophageal reflux disease without esophagitis: Secondary | ICD-10-CM | POA: Diagnosis not present

## 2017-09-12 ENCOUNTER — Ambulatory Visit
Admission: RE | Admit: 2017-09-12 | Discharge: 2017-09-12 | Disposition: A | Discharge status: Home / Self Care | Payer: Medicare Other | Source: Ambulatory Visit | Attending: Sports Medicine | Admitting: Sports Medicine

## 2017-09-12 DIAGNOSIS — G8929 Other chronic pain: Secondary | ICD-10-CM

## 2017-09-12 DIAGNOSIS — M48061 Spinal stenosis, lumbar region without neurogenic claudication: Secondary | ICD-10-CM | POA: Diagnosis not present

## 2017-09-12 DIAGNOSIS — E538 Deficiency of other specified B group vitamins: Secondary | ICD-10-CM | POA: Diagnosis not present

## 2017-09-12 DIAGNOSIS — M545 Low back pain: Principal | ICD-10-CM

## 2017-09-13 ENCOUNTER — Other Ambulatory Visit: Payer: Self-pay | Admitting: Sports Medicine

## 2017-09-13 ENCOUNTER — Ambulatory Visit (INDEPENDENT_AMBULATORY_CARE_PROVIDER_SITE_OTHER): Payer: Medicare Other | Admitting: Sports Medicine

## 2017-09-13 VITALS — BP 122/78 | Ht 71.0 in | Wt 185.0 lb

## 2017-09-13 DIAGNOSIS — G8929 Other chronic pain: Secondary | ICD-10-CM | POA: Diagnosis not present

## 2017-09-13 DIAGNOSIS — M48062 Spinal stenosis, lumbar region with neurogenic claudication: Secondary | ICD-10-CM | POA: Diagnosis not present

## 2017-09-13 DIAGNOSIS — M545 Low back pain: Principal | ICD-10-CM

## 2017-09-13 NOTE — Progress Notes (Signed)
Patient ID: James Noble, male   DOB: 07-Jun-1940, 77 y.o.   MRN: [id number]  Patient comes in today with his wife to discuss MRI findings of his lumbar spine. He has severe spinal stenosis at L3-L4 with foraminal stenosis bilaterally. Physical exam was not repeated today. We simply talked about treatment options including lumbar ESI, physical therapy, and referral to neurosurgery (Dr. Ellene Route). At this point in time, patient and his wife would like to try a lumbar epidural steroid injection. He has had good success with this in the past. He will also start working with Barbaraann Barthel in physical therapy and I've asked him and his wife to contact me via telephone one week after his lumbar ESI for a check on his progress. If he does not notice any improvement, then we could consider a facet injection or reconsider referral to neurosurgery.Patient thinks that the Neurontin is somewhat helpful so he will continue on that at night. He may increase it to 600 mg at bedtime as long as he does not experience significant side effects.  Total time spent with the patient and his wife was 15 minutes with greater than 50% of the time spent in face-to-face consultation discussing treatment options for his severe spinal stenosis.

## 2017-09-13 NOTE — Progress Notes (Signed)
   HPI  CC: Follow up lumbar stenosis/MRI results  Patient was previously seen in the sports medicine office on 5/6 with right lumbar back pain radiating to the right gluteal region. He had known history of left-sided lumbar stenosis.  At he was sent for MRI spine, which showed severe spinal stenosis L3-4 with subarticular and foraminal stenosis bilaterally, moderate spinal stenosis L4-5 with moderate subarticular stenosis bilaterally, and Left-sided disc protrusion and ost eophyte in L5-S1 with impingement of left S1 nerve root.  Patient follows up today for MRI results. He is reporting some improvement in symptoms with ibuprofen, naprosyn, and gabapentin since his last visit. He continues to have back pain worst in the morning.  Past Injuries: Denies  Tobacco hx, Family hx, Cc reviewed.  ROS: Per HPI; in addition no fever, no rash, no additional weakness, no additional numbness, no additional paresthesias, and no additional falls/injury.   Objective: BP 122/78   Ht 5\' 11"  (1.803 m)   Wt 185 lb (83.9 kg)   BMI 25.80 kg/m  Gen: NAD, well groomed, a/o x3, normal affect.  CV: Well-perfused. Warm.  Resp: Non-labored.  Neuro: No gross coordination deficits.   EXAM: MRI LUMBAR SPINE WITHOUT CONTRAST 09/12/2017 IMPRESSION: - Severe spinal stenosis L3-4 with subarticular and foraminal stenosis bilaterally - Moderate spinal stenosis L4-5. Moderate subarticular stenosis bilaterally - Left-sided disc protrusion and osteophyte L5-S1 with impingement of the left S1 nerve root.   Assessment and Plan:  1. Lumbar spinal stenosis - discussed treatment options at today's visit including physical therapy, injection, medical management, and/or surgical intervention. Shared decision making with the patient and his wife who elect to pursue PT and the corticosteroid injection. Patient reports he has done well with a spinal injection in the past. - PT referral placed today - epidural  corticosteroid injection by IR - follow up 1 week s/p injection in sports medicine office - continue naprosyn, gabapentin for symptom management  Everrett Coombe, MD PGY-2 [facility]

## 2017-09-20 DIAGNOSIS — M545 Low back pain: Secondary | ICD-10-CM | POA: Diagnosis not present

## 2017-09-20 DIAGNOSIS — H903 Sensorineural hearing loss, bilateral: Secondary | ICD-10-CM | POA: Diagnosis not present

## 2017-09-20 DIAGNOSIS — H6123 Impacted cerumen, bilateral: Secondary | ICD-10-CM | POA: Diagnosis not present

## 2017-09-27 ENCOUNTER — Ambulatory Visit
Admission: RE | Admit: 2017-09-27 | Discharge: 2017-09-27 | Disposition: A | Discharge status: Home / Self Care | Payer: Medicare Other | Source: Ambulatory Visit | Attending: Sports Medicine | Admitting: Sports Medicine

## 2017-09-27 DIAGNOSIS — G8929 Other chronic pain: Secondary | ICD-10-CM

## 2017-09-27 DIAGNOSIS — M48061 Spinal stenosis, lumbar region without neurogenic claudication: Secondary | ICD-10-CM | POA: Diagnosis not present

## 2017-09-27 DIAGNOSIS — M545 Low back pain: Principal | ICD-10-CM

## 2017-09-27 MED ORDER — IOPAMIDOL (ISOVUE-M 200) INJECTION 41%
1.0000 mL | Freq: Once | INTRAMUSCULAR | Status: AC
  Administered 2017-09-27: 1 mL via EPIDURAL

## 2017-09-27 MED ORDER — METHYLPREDNISOLONE ACETATE 40 MG/ML INJ SUSP (RADIOLOG
120.0000 mg | Freq: Once | INTRAMUSCULAR | Status: AC
  Administered 2017-09-27: 120 mg via EPIDURAL

## 2017-09-27 NOTE — Discharge Instructions (Signed)

## 2017-10-01 DIAGNOSIS — M545 Low back pain: Secondary | ICD-10-CM | POA: Diagnosis not present

## 2017-10-11 ENCOUNTER — Encounter: Payer: Self-pay | Admitting: Sports Medicine

## 2017-10-11 ENCOUNTER — Ambulatory Visit (INDEPENDENT_AMBULATORY_CARE_PROVIDER_SITE_OTHER): Payer: Medicare Other | Admitting: Sports Medicine

## 2017-10-11 VITALS — BP 130/64 | Ht 71.0 in | Wt 185.0 lb

## 2017-10-11 DIAGNOSIS — M48062 Spinal stenosis, lumbar region with neurogenic claudication: Secondary | ICD-10-CM | POA: Diagnosis not present

## 2017-10-11 MED ORDER — NAPROXEN 500 MG PO TABS: ORAL_TABLET | ORAL | 1 refills | Status: DC

## 2017-10-11 NOTE — Progress Notes (Signed)
   Subjective:    Patient ID: James Noble, male    DOB: [birth year], 77 y.o.   MRN: [id number]  HPI James Noble is a 77 year old male with chronic low back pain secondary to severe spinal stenosis at L3-L4 with foraminal stenosis bilaterally that was found on MRI imaging.  Since his last visit on 09/13/2017, he had an epidural spinal injection two weeks ago and started physical therapy. He reports that he is 95% improved and that the remaining 5% is a "nagging ache" but not sharp pain or limiting. He continues to take low-dose Advil (400 mg twice daily) and gabapentin nightly. He had stopped taking naprosyn as his prescription had run out; however, he feels that the naprosyn is more helpful in the morning prior to going to the gym for his physical therapy exercises. He has not restarted his exercise routine, as he has been focusing exclusively on his PT stretches over the last several weeks.  Denies numbness, tingling, or weakness of the bilateral lower extremities. Denies tenderness over spinal column.  His wife is interested in knowing if there are any particular exercises to avoid as to not provoke additional pain.   Review of Systems  As per HPI    Objective:   Physical Exam General: well-appearing, pleasant gentleman in no acute distress CV: warm and well-perfused Lungs: comfortable work of breathing MSK: Normal gait observed. 5/5 strength in bilateral lower extremities. No gross abnormality of sensation. No tenderness to palpation of the spinal processes or paraspinal musculature. Flexion: Full range of motion, no pain. Extension: Full range of motion, no pain.      Assessment & Plan:  James Noble is a 77 year old male with chronic low back pain found to be secondary to severe spinal stenosis of L3-L4 with foraminal stenosis bilaterally. Following epidural spinal injection and physical therapy his pain is almost completely improved with minimal ache at times. Continues to require  anti-inflammatories and gapapentin.   Discussed that he can continue naprosyn and gabapentin, but discontinue Advil while using other NSAID. Continue with PT exercises and slowly incorporate previous exercise routine as able. Discussed that there is a possibility of a second injection if that is required and that he may contact the office if that is the case. Goal would to be completely off NSAIDs with improved pain recognizing that this may not be possible.    Patient seen and evaluated with the resident. I agree with the above plan of care. I spent 15 minutes with this gentleman and his wife on today's visit. Greater than 50% of the time was spent in face-to-face consultation. He is doing very well after a single lumbar ESI. He will continue with formal physical therapy weaning to a home exercise program per the therapist's discretion. He has done well on naproxen sodium in the past so I will refill that for him to take in lieu of Advil. He may continue on 600 mg of gabapentin at night as well. I explained to both him and his wife that we could repeat the lumbar ESI at a later date if his pain returns. They understand. Follow-up as needed.

## 2017-10-18 DIAGNOSIS — M545 Low back pain: Secondary | ICD-10-CM | POA: Diagnosis not present

## 2017-11-05 ENCOUNTER — Other Ambulatory Visit: Payer: Self-pay | Admitting: Sports Medicine

## 2017-11-14 DIAGNOSIS — M545 Low back pain: Secondary | ICD-10-CM | POA: Diagnosis not present

## 2017-11-26 DIAGNOSIS — H903 Sensorineural hearing loss, bilateral: Secondary | ICD-10-CM | POA: Diagnosis not present

## 2017-11-26 DIAGNOSIS — H6123 Impacted cerumen, bilateral: Secondary | ICD-10-CM | POA: Diagnosis not present

## 2017-12-04 ENCOUNTER — Other Ambulatory Visit: Payer: Self-pay | Admitting: Sports Medicine

## 2017-12-05 DIAGNOSIS — M545 Low back pain: Secondary | ICD-10-CM | POA: Diagnosis not present

## 2017-12-10 ENCOUNTER — Ambulatory Visit (INDEPENDENT_AMBULATORY_CARE_PROVIDER_SITE_OTHER): Payer: Medicare Other | Admitting: Sports Medicine

## 2017-12-10 VITALS — BP 118/72 | Ht 71.0 in | Wt 185.0 lb

## 2017-12-10 DIAGNOSIS — G8929 Other chronic pain: Secondary | ICD-10-CM

## 2017-12-10 DIAGNOSIS — M48062 Spinal stenosis, lumbar region with neurogenic claudication: Secondary | ICD-10-CM

## 2017-12-10 DIAGNOSIS — M545 Low back pain: Secondary | ICD-10-CM

## 2017-12-10 NOTE — Progress Notes (Signed)
   Subjective:    Patient ID: James Noble, male    DOB: 26-Dec-1940, 77 y.o.   MRN: [id number]  HPI   James Noble comes in today with his wife. He is beginning to experience some returning low back pain. He has a history of lumbar spinal stenosis. He had a single lumbar ESI in May which was very helpful. He has been attending physical therapy which has also been helpful. He is taking a single naproxen sodium daily and a single gabapentin at night. He is requesting a repeat cortisone injection. He is here today with his wife. He denies numbness or tingling in his legs. He denies weakness in either leg.   Review of Systems As above    Objective:   Physical Exam  Well-developed, well-nourished. No acute distress  Lumbar spine: No tenderness to palpation  Neurological exam: Strength is 5/5 both lower extremities. Reflexes are equal at the Achilles and patellar tendons bilaterally. Sensation is intact to light touch grossly.      Assessment & Plan:   Returning low back pain secondary to spinal stenosis  I'm going to order a second lumbar ESI. I would also like to refer the patient to Dr. Ellene Route to discuss surgical options. He may continue with his naproxen sodium and his gabapentin. I did caution him about GI upset. I think he should continue in physical therapy as well. I'm happy to see the patient back after consultation with Dr. Ellene Route.

## 2017-12-17 ENCOUNTER — Other Ambulatory Visit: Payer: Self-pay | Admitting: Sports Medicine

## 2017-12-17 DIAGNOSIS — M48062 Spinal stenosis, lumbar region with neurogenic claudication: Secondary | ICD-10-CM

## 2017-12-26 ENCOUNTER — Ambulatory Visit
Admission: RE | Admit: 2017-12-26 | Discharge: 2017-12-26 | Disposition: A | Discharge status: Home / Self Care | Payer: Medicare Other | Source: Ambulatory Visit | Attending: Sports Medicine | Admitting: Sports Medicine

## 2017-12-26 DIAGNOSIS — M545 Low back pain: Secondary | ICD-10-CM | POA: Diagnosis not present

## 2017-12-26 DIAGNOSIS — M5416 Radiculopathy, lumbar region: Secondary | ICD-10-CM | POA: Diagnosis not present

## 2017-12-26 DIAGNOSIS — M48062 Spinal stenosis, lumbar region with neurogenic claudication: Secondary | ICD-10-CM

## 2017-12-26 MED ORDER — IOPAMIDOL (ISOVUE-M 200) INJECTION 41%
1.0000 mL | Freq: Once | INTRAMUSCULAR | Status: AC
  Administered 2017-12-26: 1 mL via EPIDURAL

## 2017-12-26 MED ORDER — METHYLPREDNISOLONE ACETATE 40 MG/ML INJ SUSP (RADIOLOG
120.0000 mg | Freq: Once | INTRAMUSCULAR | Status: AC
  Administered 2017-12-26: 120 mg via EPIDURAL

## 2017-12-26 NOTE — Discharge Instructions (Signed)

## 2018-01-01 ENCOUNTER — Other Ambulatory Visit: Payer: Self-pay | Admitting: *Deleted

## 2018-01-01 MED ORDER — GABAPENTIN 300 MG PO CAPS: 300.0000 mg | ORAL_CAPSULE | Freq: Every day | ORAL | 1 refills | Status: DC

## 2018-01-16 DIAGNOSIS — M545 Low back pain: Secondary | ICD-10-CM | POA: Diagnosis not present

## 2018-01-23 DIAGNOSIS — M47816 Spondylosis without myelopathy or radiculopathy, lumbar region: Secondary | ICD-10-CM | POA: Diagnosis not present

## 2018-01-23 DIAGNOSIS — M5126 Other intervertebral disc displacement, lumbar region: Secondary | ICD-10-CM | POA: Diagnosis not present

## 2018-01-23 DIAGNOSIS — M48061 Spinal stenosis, lumbar region without neurogenic claudication: Secondary | ICD-10-CM | POA: Diagnosis not present

## 2018-01-23 DIAGNOSIS — Z6826 Body mass index (BMI) 26.0-26.9, adult: Secondary | ICD-10-CM | POA: Diagnosis not present

## 2018-01-23 DIAGNOSIS — I10 Essential (primary) hypertension: Secondary | ICD-10-CM | POA: Diagnosis not present

## 2018-01-31 DIAGNOSIS — Z23 Encounter for immunization: Secondary | ICD-10-CM | POA: Diagnosis not present

## 2018-02-07 DIAGNOSIS — M545 Low back pain: Secondary | ICD-10-CM | POA: Diagnosis not present

## 2018-02-27 DIAGNOSIS — H903 Sensorineural hearing loss, bilateral: Secondary | ICD-10-CM | POA: Diagnosis not present

## 2018-02-27 DIAGNOSIS — H6123 Impacted cerumen, bilateral: Secondary | ICD-10-CM | POA: Diagnosis not present

## 2018-04-25 DIAGNOSIS — H6123 Impacted cerumen, bilateral: Secondary | ICD-10-CM | POA: Diagnosis not present

## 2018-04-25 DIAGNOSIS — H903 Sensorineural hearing loss, bilateral: Secondary | ICD-10-CM | POA: Diagnosis not present

## 2018-06-17 ENCOUNTER — Other Ambulatory Visit: Payer: Self-pay

## 2018-06-17 MED ORDER — NAPROXEN 500 MG PO TABS: 500.0000 mg | ORAL_TABLET | Freq: Two times a day (BID) | ORAL | 0 refills | Status: DC | PRN

## 2018-06-20 DIAGNOSIS — G8929 Other chronic pain: Secondary | ICD-10-CM | POA: Diagnosis not present

## 2018-06-20 DIAGNOSIS — I10 Essential (primary) hypertension: Secondary | ICD-10-CM | POA: Diagnosis not present

## 2018-06-20 DIAGNOSIS — Z86010 Personal history of colonic polyps: Secondary | ICD-10-CM | POA: Diagnosis not present

## 2018-06-20 DIAGNOSIS — Z125 Encounter for screening for malignant neoplasm of prostate: Secondary | ICD-10-CM | POA: Diagnosis not present

## 2018-06-20 DIAGNOSIS — E538 Deficiency of other specified B group vitamins: Secondary | ICD-10-CM | POA: Diagnosis not present

## 2018-06-20 DIAGNOSIS — H9193 Unspecified hearing loss, bilateral: Secondary | ICD-10-CM | POA: Diagnosis not present

## 2018-06-20 DIAGNOSIS — M545 Low back pain: Secondary | ICD-10-CM | POA: Diagnosis not present

## 2018-06-20 DIAGNOSIS — K219 Gastro-esophageal reflux disease without esophagitis: Secondary | ICD-10-CM | POA: Diagnosis not present

## 2018-06-20 DIAGNOSIS — Z1389 Encounter for screening for other disorder: Secondary | ICD-10-CM | POA: Diagnosis not present

## 2018-06-20 DIAGNOSIS — M48061 Spinal stenosis, lumbar region without neurogenic claudication: Secondary | ICD-10-CM | POA: Diagnosis not present

## 2018-06-20 DIAGNOSIS — Z0000 Encounter for general adult medical examination without abnormal findings: Secondary | ICD-10-CM | POA: Diagnosis not present

## 2018-06-20 DIAGNOSIS — E7800 Pure hypercholesterolemia, unspecified: Secondary | ICD-10-CM | POA: Diagnosis not present

## 2018-07-15 ENCOUNTER — Other Ambulatory Visit: Payer: Self-pay

## 2018-07-15 DIAGNOSIS — H6123 Impacted cerumen, bilateral: Secondary | ICD-10-CM | POA: Diagnosis not present

## 2018-07-15 DIAGNOSIS — H903 Sensorineural hearing loss, bilateral: Secondary | ICD-10-CM | POA: Diagnosis not present

## 2018-07-15 MED ORDER — GABAPENTIN 300 MG PO CAPS: 300.0000 mg | ORAL_CAPSULE | Freq: Every day | ORAL | 1 refills | Status: DC

## 2018-09-05 DIAGNOSIS — H52203 Unspecified astigmatism, bilateral: Secondary | ICD-10-CM | POA: Diagnosis not present

## 2018-09-05 DIAGNOSIS — H524 Presbyopia: Secondary | ICD-10-CM | POA: Diagnosis not present

## 2018-09-05 DIAGNOSIS — H25813 Combined forms of age-related cataract, bilateral: Secondary | ICD-10-CM | POA: Diagnosis not present

## 2018-10-17 DIAGNOSIS — E538 Deficiency of other specified B group vitamins: Secondary | ICD-10-CM | POA: Diagnosis not present

## 2019-01-28 DIAGNOSIS — Z23 Encounter for immunization: Secondary | ICD-10-CM | POA: Diagnosis not present

## 2019-03-13 DIAGNOSIS — M48061 Spinal stenosis, lumbar region without neurogenic claudication: Secondary | ICD-10-CM | POA: Diagnosis not present

## 2019-03-13 DIAGNOSIS — G8929 Other chronic pain: Secondary | ICD-10-CM | POA: Diagnosis not present

## 2019-03-13 DIAGNOSIS — N529 Male erectile dysfunction, unspecified: Secondary | ICD-10-CM | POA: Diagnosis not present

## 2019-03-13 DIAGNOSIS — K219 Gastro-esophageal reflux disease without esophagitis: Secondary | ICD-10-CM | POA: Diagnosis not present

## 2019-03-13 DIAGNOSIS — H9193 Unspecified hearing loss, bilateral: Secondary | ICD-10-CM | POA: Diagnosis not present

## 2019-03-13 DIAGNOSIS — R5383 Other fatigue: Secondary | ICD-10-CM | POA: Diagnosis not present

## 2019-03-13 DIAGNOSIS — I10 Essential (primary) hypertension: Secondary | ICD-10-CM | POA: Diagnosis not present

## 2019-03-24 DIAGNOSIS — H6123 Impacted cerumen, bilateral: Secondary | ICD-10-CM | POA: Diagnosis not present

## 2019-03-24 DIAGNOSIS — Z7289 Other problems related to lifestyle: Secondary | ICD-10-CM | POA: Diagnosis not present

## 2019-03-24 DIAGNOSIS — Z974 Presence of external hearing-aid: Secondary | ICD-10-CM | POA: Diagnosis not present

## 2019-03-24 DIAGNOSIS — H903 Sensorineural hearing loss, bilateral: Secondary | ICD-10-CM | POA: Diagnosis not present

## 2019-05-02 DIAGNOSIS — I2692 Saddle embolus of pulmonary artery without acute cor pulmonale: Secondary | ICD-10-CM

## 2019-05-02 HISTORY — DX: Saddle embolus of pulmonary artery without acute cor pulmonale: I26.92

## 2019-05-06 ENCOUNTER — Ambulatory Visit
Admission: RE | Admit: 2019-05-06 | Discharge: 2019-05-06 | Disposition: A | Discharge status: Home / Self Care | Payer: Medicare Other | Source: Ambulatory Visit | Attending: Internal Medicine | Admitting: Internal Medicine

## 2019-05-06 ENCOUNTER — Other Ambulatory Visit: Payer: Self-pay | Admitting: Internal Medicine

## 2019-05-06 DIAGNOSIS — D7589 Other specified diseases of blood and blood-forming organs: Secondary | ICD-10-CM | POA: Diagnosis not present

## 2019-05-06 DIAGNOSIS — R0600 Dyspnea, unspecified: Secondary | ICD-10-CM | POA: Diagnosis not present

## 2019-05-06 DIAGNOSIS — R600 Localized edema: Secondary | ICD-10-CM | POA: Diagnosis not present

## 2019-05-06 DIAGNOSIS — R0609 Other forms of dyspnea: Secondary | ICD-10-CM

## 2019-05-07 ENCOUNTER — Emergency Department (HOSPITAL_COMMUNITY): Payer: Medicare Other

## 2019-05-07 ENCOUNTER — Other Ambulatory Visit: Payer: Self-pay

## 2019-05-07 ENCOUNTER — Ambulatory Visit
Admission: RE | Admit: 2019-05-07 | Discharge: 2019-05-07 | Disposition: A | Discharge status: Home / Self Care | Payer: Medicare Other | Source: Ambulatory Visit | Attending: Internal Medicine | Admitting: Internal Medicine

## 2019-05-07 ENCOUNTER — Encounter (HOSPITAL_COMMUNITY): Payer: Self-pay

## 2019-05-07 ENCOUNTER — Inpatient Hospital Stay (HOSPITAL_COMMUNITY)
Admission: EM | Admit: 2019-05-07 | Discharge: 2019-05-10 | DRG: 176 | Disposition: A | Discharge status: Home / Self Care | Payer: Medicare Other | Source: Other Acute Inpatient Hospital | Attending: Cardiology | Admitting: Cardiology

## 2019-05-07 ENCOUNTER — Other Ambulatory Visit: Payer: Self-pay | Admitting: Internal Medicine

## 2019-05-07 DIAGNOSIS — R0600 Dyspnea, unspecified: Secondary | ICD-10-CM

## 2019-05-07 DIAGNOSIS — Z8249 Family history of ischemic heart disease and other diseases of the circulatory system: Secondary | ICD-10-CM | POA: Diagnosis not present

## 2019-05-07 DIAGNOSIS — R0602 Shortness of breath: Secondary | ICD-10-CM

## 2019-05-07 DIAGNOSIS — I82402 Acute embolism and thrombosis of unspecified deep veins of left lower extremity: Secondary | ICD-10-CM | POA: Diagnosis not present

## 2019-05-07 DIAGNOSIS — I2692 Saddle embolus of pulmonary artery without acute cor pulmonale: Secondary | ICD-10-CM | POA: Diagnosis not present

## 2019-05-07 DIAGNOSIS — Z823 Family history of stroke: Secondary | ICD-10-CM | POA: Diagnosis not present

## 2019-05-07 DIAGNOSIS — I10 Essential (primary) hypertension: Secondary | ICD-10-CM | POA: Diagnosis present

## 2019-05-07 DIAGNOSIS — I82432 Acute embolism and thrombosis of left popliteal vein: Secondary | ICD-10-CM | POA: Diagnosis present

## 2019-05-07 DIAGNOSIS — I82442 Acute embolism and thrombosis of left tibial vein: Secondary | ICD-10-CM | POA: Diagnosis present

## 2019-05-07 DIAGNOSIS — I82409 Acute embolism and thrombosis of unspecified deep veins of unspecified lower extremity: Secondary | ICD-10-CM

## 2019-05-07 DIAGNOSIS — Z20822 Contact with and (suspected) exposure to covid-19: Secondary | ICD-10-CM | POA: Diagnosis present

## 2019-05-07 DIAGNOSIS — I824Y9 Acute embolism and thrombosis of unspecified deep veins of unspecified proximal lower extremity: Secondary | ICD-10-CM | POA: Diagnosis not present

## 2019-05-07 DIAGNOSIS — I82452 Acute embolism and thrombosis of left peroneal vein: Secondary | ICD-10-CM | POA: Diagnosis present

## 2019-05-07 DIAGNOSIS — Z85828 Personal history of other malignant neoplasm of skin: Secondary | ICD-10-CM | POA: Diagnosis not present

## 2019-05-07 DIAGNOSIS — I2602 Saddle embolus of pulmonary artery with acute cor pulmonale: Secondary | ICD-10-CM

## 2019-05-07 DIAGNOSIS — I2699 Other pulmonary embolism without acute cor pulmonale: Secondary | ICD-10-CM | POA: Diagnosis not present

## 2019-05-07 DIAGNOSIS — I82412 Acute embolism and thrombosis of left femoral vein: Secondary | ICD-10-CM | POA: Diagnosis present

## 2019-05-07 DIAGNOSIS — E785 Hyperlipidemia, unspecified: Secondary | ICD-10-CM | POA: Diagnosis present

## 2019-05-07 DIAGNOSIS — Z87891 Personal history of nicotine dependence: Secondary | ICD-10-CM

## 2019-05-07 DIAGNOSIS — Z95828 Presence of other vascular implants and grafts: Secondary | ICD-10-CM

## 2019-05-07 HISTORY — DX: Acute embolism and thrombosis of unspecified deep veins of unspecified lower extremity: I82.409

## 2019-05-07 LAB — CBC WITH DIFFERENTIAL/PLATELET
Abs Immature Granulocytes: 0.02 10*3/uL (ref 0.00–0.07)
Basophils Absolute: 0 10*3/uL (ref 0.0–0.1)
Basophils Relative: 1 %
Eosinophils Absolute: 0.1 10*3/uL (ref 0.0–0.5)
Eosinophils Relative: 2 %
HCT: 43.1 % (ref 39.0–52.0)
Hemoglobin: 14.8 g/dL (ref 13.0–17.0)
Immature Granulocytes: 0 %
Lymphocytes Relative: 22 %
Lymphs Abs: 1.4 10*3/uL (ref 0.7–4.0)
MCH: 35.1 pg — ABNORMAL HIGH (ref 26.0–34.0)
MCHC: 34.3 g/dL (ref 30.0–36.0)
MCV: 102.1 fL — ABNORMAL HIGH (ref 80.0–100.0)
Monocytes Absolute: 0.5 10*3/uL (ref 0.1–1.0)
Monocytes Relative: 8 %
Neutro Abs: 4.3 10*3/uL (ref 1.7–7.7)
Neutrophils Relative %: 67 %
Platelets: 153 10*3/uL (ref 150–400)
RBC: 4.22 MIL/uL (ref 4.22–5.81)
RDW: 13.3 % (ref 11.5–15.5)
WBC: 6.3 10*3/uL (ref 4.0–10.5)
nRBC: 0 % (ref 0.0–0.2)

## 2019-05-07 LAB — BASIC METABOLIC PANEL
Anion gap: 10 (ref 5–15)
BUN: 11 mg/dL (ref 8–23)
CO2: 20 mmol/L — ABNORMAL LOW (ref 22–32)
Calcium: 8.8 mg/dL — ABNORMAL LOW (ref 8.9–10.3)
Chloride: 108 mmol/L (ref 98–111)
Creatinine, Ser: 0.83 mg/dL (ref 0.61–1.24)
GFR calc Af Amer: >60 mL/min (ref >60)
GFR calc non Af Amer: >60 mL/min (ref >60)
Glucose, Bld: 108 mg/dL — ABNORMAL HIGH (ref 70–99)
Potassium: 4.3 mmol/L (ref 3.5–5.1)
Sodium: 138 mmol/L (ref 135–145)

## 2019-05-07 LAB — ECHOCARDIOGRAM COMPLETE

## 2019-05-07 LAB — SARS CORONAVIRUS 2 (TAT 6-24 HRS): SARS Coronavirus 2: NEGATIVE

## 2019-05-07 LAB — BRAIN NATRIURETIC PEPTIDE: B Natriuretic Peptide: 91.8 pg/mL (ref 0.0–100.0)

## 2019-05-07 LAB — APTT: aPTT: 98 seconds — ABNORMAL HIGH (ref 24–36)

## 2019-05-07 LAB — TROPONIN I (HIGH SENSITIVITY): Troponin I (High Sensitivity): 16 ng/L (ref <18)

## 2019-05-07 MED ORDER — ONDANSETRON HCL 4 MG/2ML IJ SOLN: 4.0000 mg | Freq: Four times a day (QID) | INTRAMUSCULAR | Status: DC | PRN

## 2019-05-07 MED ORDER — ACETAMINOPHEN 325 MG PO TABS: 650.0000 mg | ORAL_TABLET | ORAL | Status: DC | PRN

## 2019-05-07 MED ORDER — IOPAMIDOL (ISOVUE-370) INJECTION 76%
75.0000 mL | Freq: Once | INTRAVENOUS | Status: AC | PRN
  Administered 2019-05-07: 11:00:00 75 mL via INTRAVENOUS

## 2019-05-07 MED ORDER — AMLODIPINE BESYLATE 10 MG PO TABS
10.0000 mg | ORAL_TABLET | Freq: Every day | ORAL | Status: DC
  Administered 2019-05-07 – 2019-05-10 (×3): 10 mg via ORAL
  Filled 2019-05-07 (×4): qty 1

## 2019-05-07 MED ORDER — HEPARIN BOLUS VIA INFUSION
5000.0000 [IU] | Freq: Once | INTRAVENOUS | Status: AC
  Administered 2019-05-07: 5000 [IU] via INTRAVENOUS
  Filled 2019-05-07: qty 5000

## 2019-05-07 MED ORDER — HEPARIN (PORCINE) 25000 UT/250ML-% IV SOLN
1400.0000 [IU]/h | INTRAVENOUS | Status: AC
  Administered 2019-05-07 – 2019-05-09 (×3): 1400 [IU]/h via INTRAVENOUS
  Filled 2019-05-07 (×3): qty 250

## 2019-05-07 MED ORDER — ROSUVASTATIN CALCIUM 20 MG PO TABS
20.0000 mg | ORAL_TABLET | Freq: Every day | ORAL | Status: DC
  Administered 2019-05-07 – 2019-05-09 (×3): 20 mg via ORAL
  Filled 2019-05-07 (×3): qty 1

## 2019-05-07 MED ORDER — LOSARTAN POTASSIUM 50 MG PO TABS
100.0000 mg | ORAL_TABLET | Freq: Every day | ORAL | Status: DC
  Administered 2019-05-07 – 2019-05-10 (×3): 100 mg via ORAL
  Filled 2019-05-07 (×4): qty 2

## 2019-05-07 NOTE — ED Triage Notes (Signed)
Pt sent by Dr. Laurann Montana.  CT angio chest performed at [place] at [facility] center this morning.  Impression:  Ext bilateral PE.  Pt states he was given "blood thinner last night and this morning".   Talking in complete sentences, denies shortness of breath or chest pain.

## 2019-05-07 NOTE — Consult Note (Signed)
Chief Complaint: Patient was seen in consultation today for pulmonary embolus  Referring Physician(s): Dr. Marva Panda  Supervising Physician: Jacqulynn Cadet  Patient Status: [facility] - In-pt  History of Present Illness: James Noble is a 79 y.o. male with past medical history of HLD, HTN, spinal stenosis presented to [facility] ED with progressive dyspnea on exertion.  He was evaluated by his PCP who sent him for an outpatient CT scan.  Presumably due to LLE/ankle swelling he was also given a dose of Xarelto last night and this AM.  CTA Chest today showed extensive bilateral pulmonary emboli from both main pulmonary outflow tracks in a saddle type distribution without heart strain.He was sent from his PCP's office to the ED for further work-up.  PCCM has evaluated the patient and feels he may be a potential candidate for catheter-directed therapy. IR consulted for possible PE lysis.   Patient assessed at bedside this afternoon.  He appears stable and comfortably on room air.  No tachypnea during visit today. Conversing easily and had just got off the phone prior to my arrival to his room. He confirms the history above stating that he has had progressive shortness of breath most notably while at the gym or on long walks with his wife. No history of DVT.  States he has not difficulty with shortness of breath at rest or with quick activities around the house. He has initiated IV heparin.   Past Medical History:  Diagnosis Date  . Cancer (HCC)    BASAL CELL , DR.LOMAX  . Hiatal hernia   . Hx of colonic polyps    DR.MEDOFF  . Hyperlipidemia    BASED ON NMR;GILBERTS SYNDROME  . Hyperuricemia without signs inflammatory arthritis/tophaceous disease 2012   uric acid 7.7  . Sciatica   . Spinal stenosis    OF LS SPINE...DR.LOVE    Past Surgical History:  Procedure Laterality Date  . colonoscopy with polypectomy  2005 & 2008   Dr Earlean Shawl  . [place]  . INGUINAL HERNIA REPAIR  1968    . steroid spinal injections  2013   X2, Dr Ernestina Patches  . TONSILLECTOMY AND ADENOIDECTOMY  1949  . UPPER GASTROINTESTINAL ENDOSCOPY  2008   Dr Earlean Shawl; Hiatal hernia  . VASECTOMY      Allergies: Dexlansoprazole and Nexium [esomeprazole magnesium]  Medications: Prior to Admission medications   Medication Sig Start Date End Date Taking? Authorizing Provider  amLODipine (NORVASC) 5 MG tablet Take 2 tablets (10 mg total) by mouth daily. Overdue for annual appt w/labs must see provider for refills 11/14/16  Yes Burns, Claudina Lick, MD  Apoaequorin (PREVAGEN PO) Take 1 capsule by mouth daily.   Yes [provider]  bismuth subsalicylate (PEPTO BISMOL) 262 MG chewable tablet Chew 524 mg by mouth as needed for indigestion.   Yes [provider]  Ergocalciferol (VITAMIN D2) 400 units TABS Take 1 tablet by mouth daily.   Yes [provider]  fluticasone (FLONASE) 50 MCG/ACT nasal spray Place 2 sprays into both nostrils at bedtime.   Yes [provider]  guaiFENesin (MUCINEX) 600 MG 12 hr tablet Take 600 mg by mouth daily.   Yes [provider]  guaiFENesin (ROBITUSSIN) 100 MG/5ML liquid Take 100 mg by mouth at bedtime.   Yes [provider]  losartan (COZAAR) 100 MG tablet Take 1 tablet (100 mg total) by mouth daily. Must keep August appt for future refills 11/07/16  Yes Binnie Rail, MD  Multiple  Vitamin (MULTI-VITAMINS) TABS Take 1 tablet by mouth daily.   Yes [provider]  Probiotic Product (PROBIOTIC ACIDOPHILUS BEADS PO) Take 1 capsule by mouth daily.   Yes [provider]  Rivaroxaban (XARELTO) 15 MG TABS tablet Take 15 mg by mouth 2 (two) times daily with a meal.   Yes [provider]  rosuvastatin (CRESTOR) 20 MG tablet TAKE ONE TABLET AT BEDTIME Patient taking differently: Take 20 mg by mouth at bedtime.  09/04/16  Yes Burns, Claudina Lick, MD  vitamin B-12 (CYANOCOBALAMIN) 1000 MCG tablet Take 1,000 mcg by mouth daily.    Yes [provider]  cyclobenzaprine (FLEXERIL) 5 MG tablet Take 1 tablet (5 mg total) by mouth 2 (two) times daily as needed for muscle spasms (may cause drowsiness). Patient not taking: Reported on 12/10/2017 04/28/16   Nche, Charlene Brooke, NP  gabapentin (NEURONTIN) 300 MG capsule Take 1 capsule (300 mg total) by mouth at bedtime. 07/15/18   Thurman Coyer, DO  naproxen (NAPROSYN) 500 MG tablet Take 1 tablet (500 mg total) by mouth 2 (two) times daily as needed. Take with food. Patient not taking: Reported on 05/07/2019 06/17/18   Thurman Coyer, DO     Family History  Problem Relation Age of Onset  . Heart attack Father 29        RHD, smoker  . Stroke Maternal Grandmother        late 17s  . Stroke Mother 46       smoker  . Stroke Maternal Uncle        X 3  . Stroke Maternal Grandfather   . Diabetes Neg Hx     Social History   Socioeconomic History  . Marital status: Married    Spouse name: Not on file  . Number of children: Not on file  . Years of education: Not on file  . Highest education level: Not on file  Occupational History  . Occupation: ORTHODONTIST  Tobacco Use  . Smoking status: Former Smoker    Quit date: 05/01/1968    Years since quitting: 51.0  . Smokeless tobacco: Never Used  . Tobacco comment: smoked 1960-1970, up to 1 ppd  Substance and Sexual Activity  . Alcohol use: Yes    Alcohol/week: 14.0 standard drinks    Types: 14 Glasses of wine per week    Comment:  socially  . Drug use: No  . Sexual activity: Not on file  Other Topics Concern  . Not on file  Social History Narrative   HEART HEALTHY DIET   EXERCISES 4-6 TIME WEEKLY   Social Determinants of Health   Financial Resource Strain:   . Difficulty of Paying Living Expenses: Not on file  Food Insecurity:   . Worried About Charity fundraiser in the Last Year: Not on file  . Ran Out of Food in the Last Year: Not on file  Transportation Needs:   . Lack of Transportation (Medical):  Not on file  . Lack of Transportation (Non-Medical): Not on file  Physical Activity:   . Days of Exercise per Week: Not on file  . Minutes of Exercise per Session: Not on file  Stress:   . Feeling of Stress : Not on file  Social Connections:   . Frequency of Communication with Friends and Family: Not on file  . Frequency of Social Gatherings with Friends and Family: Not on file  . Attends Religious Services: Not on file  . Active Member of  Clubs or Organizations: Not on file  . Attends Archivist Meetings: Not on file  . Marital Status: Not on file     Review of Systems: A 12 point ROS discussed and pertinent positives are indicated in the HPI above.  All other systems are negative.  Review of Systems  Constitutional: Positive for fatigue. Negative for fever.  Respiratory: Positive for cough and shortness of breath.   Cardiovascular: Negative for chest pain.  Gastrointestinal: Negative for abdominal pain, diarrhea, nausea and vomiting.  Genitourinary: Negative for dysuria.  Psychiatric/Behavioral: Negative for behavioral problems and confusion.    Vital Signs: BP (!) 167/104   Pulse 74   Temp 98.2 F (36.8 C) (Oral)   Resp (!) 23   Ht 5\' 11"  (1.803 m)   Wt 196 lb (88.9 kg)   SpO2 94%   BMI 27.34 kg/m   Physical Exam Vitals and nursing note reviewed.  Constitutional:      Appearance: Normal appearance.  HENT:     Mouth/Throat:     Mouth: Mucous membranes are moist.     Pharynx: Oropharynx is clear.  Cardiovascular:     Rate and Rhythm: Normal rate and regular rhythm.  Pulmonary:     Effort: Pulmonary effort is normal. No respiratory distress.     Breath sounds: Normal breath sounds.  Abdominal:     General: Abdomen is flat.     Palpations: Abdomen is soft.  Musculoskeletal:        General: Swelling (LLE/ankle which extends mildly into the left calf/shin) present.  Skin:    General: Skin is warm and dry.  Neurological:     General: No focal deficit  present.     Mental Status: He is alert and oriented to person, place, and time. Mental status is at baseline.  Psychiatric:        Mood and Affect: Mood normal.        Behavior: Behavior normal.        Thought Content: Thought content normal.        Judgment: Judgment normal.      MD Evaluation Airway: WNL Heart: WNL Abdomen: WNL Chest/ Lungs: WNL ASA  Classification: 3 Mallampati/Airway Score: One   Imaging: DG Chest 2 View  Result Date: 05/06/2019 CLINICAL DATA:  Dyspnea on exertion for several weeks EXAM: CHEST - 2 VIEW COMPARISON:  03/12/2014 FINDINGS: The heart size and mediastinal contours are within normal limits. Both lungs are clear. The visualized skeletal structures are unremarkable. IMPRESSION: No active cardiopulmonary disease. Electronically Signed   By: Inez Catalina M.D.   On: 05/06/2019 15:23   CT ANGIO CHEST PE W OR WO CONTRAST  Result Date: 05/07/2019 CLINICAL DATA:  Shortness of breath EXAM: CT ANGIOGRAPHY CHEST WITH CONTRAST TECHNIQUE: Multidetector CT imaging of the chest was performed using the standard protocol during bolus administration of intravenous contrast. Multiplanar CT image reconstructions and MIPs were obtained to evaluate the vascular anatomy. CONTRAST:  52mL ISOVUE-370 IOPAMIDOL (ISOVUE-370) INJECTION 76% COMPARISON:  Chest radiograph May 06, 2019 FINDINGS: Cardiovascular: There is extensive pulmonary embolus, arising in both main pulmonary arteries in a saddle type distribution with extension of pulmonary emboli into multiple upper and lower lobe pulmonary artery branches bilaterally. The right ventricle to left ventricle diameter ratio is less than 0.9, not indicative of right heart strain. There is no thoracic aortic aneurysm or dissection. Visualized great vessels demonstrate occasional mild foci of calcification. Note that the right innominate and left common carotid arteries arise  as a common trunk, an anatomic variant. There is aortic  atherosclerosis as well as foci of coronary artery calcification. There is no pericardial effusion or pericardial thickening. Mediastinum/Nodes: Thyroid appears unremarkable. There is no appreciable thoracic adenopathy by size criteria. There are occasional subcentimeter mediastinal lymph nodes which do not meet size criteria for pathologic significance. There is a small hiatal hernia. Lungs/Pleura: There are scattered areas of atelectatic change. No evident consolidation or pulmonary infarct. No pleural effusions are evident. Upper Abdomen: There is upper abdominal aortic atherosclerosis. Visualized upper abdominal structures otherwise appear unremarkable. Musculoskeletal: There are foci of degenerative change in the thoracic spine. No blastic or lytic bone lesions are evident. There are no chest wall lesions. Review of the MIP images confirms the above findings. IMPRESSION: 1. Extensive bilateral pulmonary emboli. Pulmonary emboli arise from both main pulmonary outflow tracks in a saddle type distribution. Right ventricle to left ventricular diameter is less than 0.9, not indicative of right heart strain. 2. No thoracic aortic aneurysm or dissection. There is aortic atherosclerosis as well as foci of coronary artery calcification. 3. Areas of scattered atelectasis. No edema or consolidation. No evident pulmonary infarct. 4.  Small hiatal hernia. 5.  No adenopathy. Critical Value/emergent results were called by telephone at the time of interpretation on 05/07/2019 at 11:03 am to providerJOHN GRIFFIN , who verbally acknowledged these results. Aortic Atherosclerosis (ICD10-I70.0). Electronically Signed   By: Lowella Grip III M.D.   On: 05/07/2019 11:03    Labs:  CBC: Recent Labs    05/07/19 1223  WBC 6.3  HGB 14.8  HCT 43.1  PLT 153    COAGS: No results for input(s): INR, APTT in the last 8760 hours.  BMP: Recent Labs    05/07/19 1223  NA 138  K 4.3  CL 108  CO2 20*  GLUCOSE 108*  BUN 11   CALCIUM 8.8*  CREATININE 0.83  GFRNONAA >60  GFRAA >60    LIVER FUNCTION TESTS: No results for input(s): BILITOT, AST, ALT, ALKPHOS, PROT, ALBUMIN in the last 8760 hours.  TUMOR MARKERS: No results for input(s): AFPTM, CEA, CA199, CHROMGRNA in the last 8760 hours.  Assessment and Plan: Saddle pulmonary embolus Patient with unprovoked DVT identified by Chest CTA this AM is referred to ED by his PCP.  Patient has been evaluated by PCCM who have initiated heparin drip and consulted IR for possible lysis.  Patient currently stable with O2 saturations 95%+ on room air. HR 76.  RR 20. No heart strain identified on CT.  ECHO pending.  No identified contraindications to lysis, however patient did receive 2 doses of Xarelto and lysis (if needed) is delayed at this time.  Discussed procedure with patient who is agreeable.  Will monitor on heparin drip and follow medical status for possible PE lysis.   Risks and benefits discussed with the patient including, but not limited to bleeding, possible life threatening bleeding and need for blood product transfusion, vascular injury, stroke, contrast induced renal failure, limb loss and infection.  All of the patient's questions were answered, patient is agreeable to proceed. Consent signed and in chart.  Thank you for this interesting consult.  I greatly enjoyed meeting James Noble and look forward to participating in their care.  A copy of this report was sent to the requesting provider on this date.  Electronically Signed: Docia Barrier, PA 05/07/2019, 5:21 PM   I spent a total of 40 Minutes    in face to face in  clinical consultation, greater than 50% of which was counseling/coordinating care for pulmonary embolus.

## 2019-05-07 NOTE — Progress Notes (Signed)
[place] for heparin Indication: pulmonary embolus  Heparin Dosing Weight: 88 kg  Vital Signs: Temp: 98.2 F (36.8 C) (01/06 1154) Temp Source: Oral (01/06 1154) BP: 146/83 (01/06 1245) Pulse Rate: 70 (01/06 1245)  Labs: Recent Labs    05/07/19 1223  HGB 14.8  HCT 43.1  PLT 153  CREATININE 0.83  TROPONINIHS 16     Medical History: Past Medical History:  Diagnosis Date  . Cancer (HCC)    BASAL CELL , DR.LOMAX  . Hiatal hernia   . Hx of colonic polyps    DR.MEDOFF  . Hyperlipidemia    BASED ON NMR;GILBERTS SYNDROME  . Hyperuricemia without signs inflammatory arthritis/tophaceous disease 2012   uric acid 7.7  . Sciatica   . Spinal stenosis    OF LS SPINE...DR.LOVE     Assessment: Pt is on rivaroxaban prior to admission for PE. He just started the rivaroxaban recently as he was still receiving the loading dose. His last dose of Xarelto was this morning. CTA today shows a saddle and bilateral PE. Risks vs benefits assessed of starting heparin now or waiting until this evening. Given extensive nature of PE, decision was made to start now. RV/LV ratio < 0.9, CBC and renal fx, stable. May be candidate for EKOS.   Goal of Therapy:  Heparin level 0.3-0.7 units/ml Monitor platelets by anticoagulation protocol: Yes    Plan:  -Heparin bolus 5000 units x1 then 1400 units/hr -Daily HL, CBC, aPTT -Check first aPTT tonight   Harvel Quale 05/07/2019,2:22 PM

## 2019-05-07 NOTE — H&P (Addendum)
Cardiology Admission History and Physical:   Patient ID: WESTIN SPIGELMYER MRN: [id number]; DOB: 1940-12-12   Admission date: 05/07/2019  Primary Care Provider: Lavone Orn, MD Primary Cardiologist: No primary care provider on file. Daulton Harbaugh Primary Electrophysiologist:  None   Chief Complaint:  Dyspnea, pulmonary embolism  Patient Profile:   RUTHERFORD GROVES is a 79 y.o. male here with acute saddle pulmonary embolus sent from Dr. Delene Ruffini office for positive PE on CT.   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  Approximately 4 weeks ago he started to notice more shortness of breath when walking through the neighborhood, usually 2 to 3 miles a day with his wife [name].  When going up hills he was noticing more dyspnea and that it was harder than usual.  Denies any chest pain.  Approximately 2 weeks ago while getting his nails cut, they noticed left lower extremity edema.  No prior myocardial infarction.  Approximately 2 years ago had stress test which was low risk.  CT scan showed extensive bilateral clot burden, saddle PE with RV dimensions 0.9 compared to LV, no clear evidence of RV strain.  Echocardiogram is currently being performed.  Hemodynamically he is stable.  He is able to complete full sentences without difficulty but does have mildly increased work of breathing noted at rest.  Approximately 1 week ago could not complete his normal exercise routine at the gym and stopped going.  Heart Pathway Score:     Past Medical History:  Diagnosis Date  . Cancer (HCC)    BASAL CELL , DR.LOMAX  . Hiatal hernia   . Hx of colonic polyps    DR.MEDOFF  . Hyperlipidemia    BASED ON NMR;GILBERTS SYNDROME  . Hyperuricemia without signs inflammatory arthritis/tophaceous disease 2012   uric  acid 7.7  . Sciatica   . Spinal stenosis    OF LS SPINE...DR.LOVE    Past Surgical History:  Procedure Laterality Date  . colonoscopy with polypectomy  2005 & 2008   Dr Earlean Shawl  . [place]  . INGUINAL HERNIA REPAIR  1968  . steroid spinal injections  2013   X2, Dr Ernestina Patches  . TONSILLECTOMY AND ADENOIDECTOMY  1949  . UPPER GASTROINTESTINAL ENDOSCOPY  2008   Dr Earlean Shawl; Hiatal hernia  . VASECTOMY       Medications Prior to Admission: Prior to Admission medications   Medication Sig Start Date End Date Taking? Authorizing Provider  amLODipine (NORVASC) 5 MG tablet Take 2 tablets (10 mg total) by mouth daily. Overdue for annual appt w/labs must see provider for refills 11/14/16  Yes Burns, Claudina Lick, MD  Apoaequorin (PREVAGEN PO) Take 1 capsule by mouth daily.   Yes [provider]  bismuth subsalicylate (PEPTO BISMOL) 262 MG chewable tablet Chew 524 mg by mouth as needed for indigestion.   Yes [provider]  Ergocalciferol (VITAMIN D2) 400 units TABS Take 1 tablet by mouth daily.   Yes [provider]  fluticasone (FLONASE) 50 MCG/ACT nasal spray Place 2 sprays into both nostrils at bedtime.   Yes [provider]  guaiFENesin (MUCINEX) 600 MG 12 hr tablet Take 600 mg by mouth daily.   Yes [provider]  guaiFENesin (ROBITUSSIN) 100 MG/5ML liquid Take 100 mg by mouth at bedtime.   Yes [provider]  losartan (COZAAR) 100 MG tablet Take 1 tablet (100 mg total) by mouth daily. Must keep August appt for future refills 11/07/16  Yes Burns, Claudina Lick, MD  Multiple Vitamin (MULTI-VITAMINS) TABS Take 1 tablet by mouth daily.   Yes [provider]  Probiotic Product (PROBIOTIC ACIDOPHILUS BEADS PO) Take 1 capsule by mouth daily.   Yes [provider]  Rivaroxaban (XARELTO) 15 MG TABS tablet Take 15 mg by mouth 2 (two) times daily with a meal.   Yes [provider]  rosuvastatin (CRESTOR) 20 MG tablet  TAKE ONE TABLET AT BEDTIME Patient taking differently: Take 20 mg by mouth at bedtime.  09/04/16  Yes Burns, Claudina Lick, MD  vitamin B-12 (CYANOCOBALAMIN) 1000 MCG tablet Take 1,000 mcg by mouth daily.   Yes [provider]  cyclobenzaprine (FLEXERIL) 5 MG tablet Take 1 tablet (5 mg total) by mouth 2 (two) times daily as needed for muscle spasms (may cause drowsiness). Patient not taking: Reported on 12/10/2017 04/28/16   Nche, Charlene Brooke, NP  gabapentin (NEURONTIN) 300 MG capsule Take 1 capsule (300 mg total) by mouth at bedtime. 07/15/18   Thurman Coyer, DO  naproxen (NAPROSYN) 500 MG tablet Take 1 tablet (500 mg total) by mouth 2 (two) times daily as needed. Take with food. Patient not taking: Reported on 05/07/2019 06/17/18   Thurman Coyer, DO     Allergies:    Allergies  Allergen Reactions  . Dexlansoprazole Nausea And Vomiting  . Nexium [Esomeprazole Magnesium]     Nausea & vomiting    Social History:   Social History   Socioeconomic History  . Marital status: Married    Spouse name: Not on file  . Number of children: Not on file  . Years of education: Not on file  . Highest education level: Not on file  Occupational History  . Occupation: ORTHODONTIST  Tobacco Use  . Smoking status: Former Smoker    Quit date: 05/01/1968    Years since quitting: 51.0  . Smokeless tobacco: Never Used  . Tobacco comment: smoked 1960-1970, up to 1 ppd  Substance and Sexual Activity  . Alcohol use: Yes    Alcohol/week: 14.0 standard drinks    Types: 14 Glasses of wine per week    Comment:  socially  . Drug use: No  . Sexual activity: Not on file  Other Topics Concern  . Not on file  Social History Narrative   HEART HEALTHY DIET   EXERCISES 4-6 TIME WEEKLY   Social Determinants of Health   Financial Resource Strain:   . Difficulty of Paying Living Expenses: Not on file  Food Insecurity:   . Worried About Charity fundraiser in the Last Year: Not on file  . Ran Out of  Food in the Last Year: Not on file  Transportation Needs:   . Lack of Transportation (Medical): Not on file  . Lack of Transportation (Non-Medical): Not on file  Physical Activity:   . Days of Exercise per Week: Not on file  . Minutes of Exercise per Session: Not on file  Stress:   . Feeling of Stress : Not on file  Social Connections:   . Frequency of Communication with Friends and Family: Not on file  . Frequency of Social Gatherings with Friends and Family: Not on file  . Attends Religious Services: Not on file  . Active Member of Clubs or Organizations: Not on file  . Attends Archivist Meetings:  Not on file  . Marital Status: Not on file  Intimate Partner Violence:   . Fear of Current or Ex-Partner: Not on file  . Emotionally Abused: Not on file  . Physically Abused: Not on file  . Sexually Abused: Not on file    Family History:   The patient's family history includes Heart attack (age of onset: 77) in his father; Stroke in his maternal grandfather, maternal grandmother, and maternal uncle; Stroke (age of onset: 32) in his mother. There is no history of Diabetes.    ROS:  Please see the history of present illness.  No recent fevers chills cough syncope bleeding orthopnea chest pain all other ROS reviewed and negative.     Physical Exam/Data:   Vitals:   05/07/19 1154 05/07/19 1245  BP: (!) 162/94 (!) 146/83  Pulse: 79 70  Resp: 18 15  Temp: 98.2 F (36.8 C)   TempSrc: Oral   SpO2: 97% 94%   No intake or output data in the 24 hours ending 05/07/19 1422 Last 3 Weights 12/10/2017 10/11/2017 09/13/2017  Weight (lbs) 185 lb 185 lb 185 lb  Weight (kg) 83.915 kg 83.915 kg 83.915 kg     There is no height or weight on file to calculate BMI.  General:  Well nourished, well developed, resting fairly comfortably in bed but does appear to have slightly increased respiratory rate at rest HEENT: normal Lymph: no adenopathy Neck: no JVD Endocrine:  No  thryomegaly Vascular: No carotid bruits; FA pulses 2+ bilaterally without bruits  Cardiac:  normal S1, S2; RRR; no murmur  Lungs:  clear to auscultation bilaterally, no wheezing, rhonchi or rales  Abd: soft, nontender, no hepatomegaly  Ext: Left lower extremity 2-3+ edema Musculoskeletal:  No deformities, BUE and BLE strength normal and equal Skin: warm and dry  Neuro:  CNs 2-12 intact, no focal abnormalities noted Psych:  Normal affect    EKG:  The ECG that was done  was personally reviewed and demonstrates right bundle branch block T wave inversions inferiorly no change from prior  Relevant CV Studies: Echocardiogram currently pending  Laboratory Data:  High Sensitivity Troponin:   Recent Labs  Lab 05/07/19 1223  TROPONINIHS 16      Chemistry Recent Labs  Lab 05/07/19 1223  NA 138  K 4.3  CL 108  CO2 20*  GLUCOSE 108*  BUN 11  CREATININE 0.83  CALCIUM 8.8*  GFRNONAA >60  GFRAA >60  ANIONGAP 10    No results for input(s): PROT, ALBUMIN, AST, ALT, ALKPHOS, BILITOT in the last 168 hours. Hematology Recent Labs  Lab 05/07/19 1223  WBC 6.3  RBC 4.22  HGB 14.8  HCT 43.1  MCV 102.1*  MCH 35.1*  MCHC 34.3  RDW 13.3  PLT 153   BNP Recent Labs  Lab 05/07/19 1223  BNP 91.8    DDimer No results for input(s): DDIMER in the last 168 hours.   Radiology/Studies:  DG Chest 2 View  Result Date: 05/06/2019 CLINICAL DATA:  Dyspnea on exertion for several weeks EXAM: CHEST - 2 VIEW COMPARISON:  03/12/2014 FINDINGS: The heart size and mediastinal contours are within normal limits. Both lungs are clear. The visualized skeletal structures are unremarkable. IMPRESSION: No active cardiopulmonary disease. Electronically Signed   By: Inez Catalina M.D.   On: 05/06/2019 15:23   CT ANGIO CHEST PE W OR WO CONTRAST  Result Date: 05/07/2019 CLINICAL DATA:  Shortness of breath EXAM: CT ANGIOGRAPHY CHEST WITH CONTRAST TECHNIQUE: Multidetector CT  imaging of the chest was performed  using the standard protocol during bolus administration of intravenous contrast. Multiplanar CT image reconstructions and MIPs were obtained to evaluate the vascular anatomy. CONTRAST:  26mL ISOVUE-370 IOPAMIDOL (ISOVUE-370) INJECTION 76% COMPARISON:  Chest radiograph May 06, 2019 FINDINGS: Cardiovascular: There is extensive pulmonary embolus, arising in both main pulmonary arteries in a saddle type distribution with extension of pulmonary emboli into multiple upper and lower lobe pulmonary artery branches bilaterally. The right ventricle to left ventricle diameter ratio is less than 0.9, not indicative of right heart strain. There is no thoracic aortic aneurysm or dissection. Visualized great vessels demonstrate occasional mild foci of calcification. Note that the right innominate and left common carotid arteries arise as a common trunk, an anatomic variant. There is aortic atherosclerosis as well as foci of coronary artery calcification. There is no pericardial effusion or pericardial thickening. Mediastinum/Nodes: Thyroid appears unremarkable. There is no appreciable thoracic adenopathy by size criteria. There are occasional subcentimeter mediastinal lymph nodes which do not meet size criteria for pathologic significance. There is a small hiatal hernia. Lungs/Pleura: There are scattered areas of atelectatic change. No evident consolidation or pulmonary infarct. No pleural effusions are evident. Upper Abdomen: There is upper abdominal aortic atherosclerosis. Visualized upper abdominal structures otherwise appear unremarkable. Musculoskeletal: There are foci of degenerative change in the thoracic spine. No blastic or lytic bone lesions are evident. There are no chest wall lesions. Review of the MIP images confirms the above findings. IMPRESSION: 1. Extensive bilateral pulmonary emboli. Pulmonary emboli arise from both main pulmonary outflow tracks in a saddle type distribution. Right ventricle to left  ventricular diameter is less than 0.9, not indicative of right heart strain. 2. No thoracic aortic aneurysm or dissection. There is aortic atherosclerosis as well as foci of coronary artery calcification. 3. Areas of scattered atelectasis. No edema or consolidation. No evident pulmonary infarct. 4.  Small hiatal hernia. 5.  No adenopathy. Critical Value/emergent results were called by telephone at the time of interpretation on 05/07/2019 at 11:03 am to providerJOHN GRIFFIN , who verbally acknowledged these results. Aortic Atherosclerosis (ICD10-I70.0). Electronically Signed   By: Lowella Grip III M.D.   On: 05/07/2019 11:03         Assessment and Plan:   Acute saddle pulmonary embolus unprovoked with presumed left lower extremity DVT -I have discussed the case with Dr. Haroldine Laws and we have also consulted our interventional radiology partner as well as critical care partners about the possibility of EKOS, TPA catheter focused thrombolysis placed in the pulmonary artery. -Last night and this morning he received Xarelto.  This may affect the timing of TPA. -He does not have large RV on CT but he does have an extremely significant large clot burden bilaterally, saddle.  Long-term benefit of EKOS likely would be positive weighing risks of bleeding. -We will go ahead and place him on IV heparin.  After potential EKOS, he can be placed back on Xarelto. -We will also test him for COVID-19 given the possible hypercoagulable state associated with this.  Once again unprovoked PE.  Lab work is unremarkable other than D-dimer of greater than 20, BNP is normal creatinine is normal at 0.92 hemoglobin 14.4 normal white count of 6.2 and platelet count of 155. -Echocardiogram preliminarily reviewed at bedside demonstrates RV dimension of approximately 4 cm which is mildly dilated with perhaps minimal reduction/mild reduction in RV systolic function.  There is not a good tricuspid regurgitant jet to estimate PA  systolic pressure.  LV function with right bundle branch block appears normal.  Saddle pulmonary embolus can be seen on images.  Left lower extremity edema -Likely lower extremity DVT that started more than 2 weeks ago.  We will check lower extremity Dopplers.  Anticoagulation.  Hyperlipidemia -Crestor continue.  Severity of Illness: The appropriate patient status for this patient is INPATIENT. Inpatient status is judged to be reasonable and necessary in order to provide the required intensity of service to ensure the patient's safety. The patient's presenting symptoms, physical exam findings, and initial radiographic and laboratory data in the context of their chronic comorbidities is felt to place them at high risk for further clinical deterioration. Furthermore, it is not anticipated that the patient will be medically stable for discharge from the hospital within 2 midnights of admission. The following factors support the patient status of inpatient.   " The patient's presenting symptoms include pulmonary embolism, shortness of breath. " The worrisome physical exam findings include mildly increased work of breathing. " The initial radiographic and laboratory data are worrisome because of saddle pulmonary emboli. " The chronic co-morbidities include hyperlipidemia.   * I certify that at the point of admission it is my clinical judgment that the patient will require inpatient hospital care spanning beyond 2 midnights from the point of admission due to high intensity of service, high risk for further deterioration and high frequency of surveillance required.*  CRITICAL CARE Performed by: Candee Furbish   Total critical care time: 60 minutes  Critical care time was exclusive of separately billable procedures and treating other patients.  Critical care was necessary to treat or prevent imminent or life-threatening deterioration.  Critical care was time spent personally by me on the following  activities: development of treatment plan with patient and/or surrogate as well as nursing, discussions with consultants, evaluation of patient's response to treatment, examination of patient, obtaining history from patient or surrogate, ordering and performing treatments and interventions, ordering and review of laboratory studies, ordering and review of radiographic studies, pulse oximetry and re-evaluation of patient's condition.     For questions or updates, please contact [place] Please consult [email or web address] for contact info under        Signed, Candee Furbish, MD  05/07/2019 2:22 PM

## 2019-05-07 NOTE — Consult Note (Addendum)
PULMONARY / CRITICAL CARE MEDICINE   NAME:  James Noble, MRN:  [id number], DOB:  March 19, 1941, LOS: 0 ADMISSION DATE:  05/07/2019, CONSULTATION DATE:  05/06/2018 REFERRING MD:  Candee Furbish MD CHIEF COMPLAINT:  dyspnea  BRIEF HISTORY:    James Noble is a 79 year old male with one month of progressive dyspnea found to have acute saddle pulmonary embolus.   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 Patient usually walks 2-3 miles per day with his wife but has noted increasing dyspnea recently. He did notice left lower extremity edema two weeks ago. Approximately one week ago, patient noted increased fatigue and was unable to complete his normal exercise routine at the gym and stopped going. Patient denies any associated chest pain, hemoptysis, sedentary status, recent surgeries, recent travel, or prior history of DVT/PE.   SIGNIFICANT PAST MEDICAL HISTORY   Hypertension Hyperlipidemia Lumbar spinal stenosis w/sciatica Basal cell carcinoma s/p Mohs procedure in 2014 Hyperuricemia w/o inflammatory arthritis/tophaceous disease  SIGNIFICANT EVENTS:  01/06 > admission for acute saddle embolus   STUDIES:   01/06: CTA Chest > Extensive bilateral pulmonary emboli. Pulmonary emboli arise from both main pulmonary outflow tracks in a saddle type distribution. Right ventricle to left ventricular diameter is less than 0.9, not indicative of right heart strain.  No thoracic aortic aneurysm or dissection. There is aortic atherosclerosis as well as foci of coronary artery calcification. Areas of scattered atelectasis. No edema or consolidation. No evident pulmonary infarct.  01/06: Echo > 01/06: DVT US >   CULTURES:  n/a  ANTIBIOTICS:  n/a  LINES/TUBES:  L  forearm peripheral IV  CONSULTANTS:  Cardiology  SUBJECTIVE:   CONSTITUTIONAL: BP (!) 141/68   Pulse 69   Temp 98.2 F (36.8 C) (Oral)   Resp 12   SpO2 94%   PHYSICAL EXAM: Physical Exam Vitals reviewed.  Constitutional:      General: He is not in acute distress.    Appearance: Normal appearance. He is not toxic-appearing or diaphoretic.  Neck:     Vascular: No carotid bruit.  Cardiovascular:     Rate and Rhythm: Normal rate and regular rhythm.     Pulses: Normal pulses.     Heart sounds: Normal heart sounds. No murmur. No friction rub. No gallop.   Pulmonary:     Effort: Pulmonary effort is normal. No respiratory distress.     Breath sounds: Normal breath sounds. No stridor. No wheezing, rhonchi or rales.     Comments: Mild dyspnea while conversing Chest:     Chest wall: No tenderness.  Abdominal:     General: Bowel sounds are normal. There is no distension.     Palpations: Abdomen is soft.     Tenderness: There is no abdominal tenderness. There is no guarding.     Hernia: No hernia is present.  Musculoskeletal:        General: Normal range of motion.     Cervical back: Normal range of motion and neck supple.  Skin:    General: Skin is warm and dry.     Capillary Refill: Capillary refill takes less than 2 seconds.  Neurological:     Mental Status: He is alert and oriented to person, place, and time. Mental status is at baseline.  RESOLVED PROBLEM LIST   ASSESSMENT AND PLAN   79 year old male with PMHx of hypertension and hyperlipidemia presenting with one month history of progressive dyspnea on exertion and two weeks of left lower extremity edema. He is found to have a saddle pulmonary embolus on CTA Chest. On examination, patient is in no acute distress and is hemodynamically stable. Patient admitted for management of acute saddle pulmonary embolus.   Acute unprovoked saddle pulmonary embolus: Patient with progressive dyspnea on exertion for one month and  fatigue over the past week found to have a saddle pulmonary embolus on CTA Chest at his PCP's office and referred to [facility] to be admitted. Patient denies any chest pain or hemoptysis but does not a mild cough during this time. He does not have a prior history of DVT/PE, family history of hypercoagulable diseases, sedentary lifestyle, or recent travel. He does have a remote history of smoking but notes that he quit over 20 years ago. On examination, he is not in any acute distress and is hemodynamically stable. CTA Chest with extensive saddle pulmonary embolus with extension into multiple upper and lower lobe pulmonary artery branches.  Although he does not have any overt symptoms of COVID-19, he does have a mild cough and slightly elevated D-dimer (could be attributed to the DVT/PE), will still benefit from COVID testing as this could trigger hypercoagulable state.  P: - Patient may benefit from direct thrombolysis; however, he recently received Xarelto which may delay the timing of this  - Continue to hold Xarelto - IV heparin  - f/u Echo  Left lower extremity edema:  Two week history of left lower extremity edema. Likely DVT  P:  - f/u LLE vascular US   SUMMARY OF TODAY'S PLAN:  Patient is a 79yo male admitted for an unprovoked acute saddle pulmonary embolus and presumed LLE DVT. He is hemodynamically stable and in no acute distress. Patient started on IV heparin. PCCM and IR consulted by cardiology for possibility of EKOS. However, patient received Xarelto yesterday and this morning which may delay the timing of directed tPA. PCCM will continue to follow patient with cardiology.   Best Practice / Goals of Care / Disposition.   DVT PROPHYLAXIS: On IV Heparin for VTE/PE treatment  NUTRITION: per primary team  MOBILITY: as tolerated  GOALS OF CARE: Full code (per primary)  FAMILY DISCUSSIONS: primary team will contact family  DISPOSITION ICU level care   LABS  Glucose No results for input(s):  GLUCAP in the last 168 hours.  BMET Recent Labs  Lab 05/07/19 1223  NA 138  K 4.3  CL 108  CO2 20*  BUN 11  CREATININE 0.83  GLUCOSE 108*    Liver Enzymes No results for input(s): AST, ALT, ALKPHOS, BILITOT, ALBUMIN in the last 168 hours.  Electrolytes Recent Labs  Lab 05/07/19 1223  CALCIUM 8.8*    CBC Recent Labs  Lab 05/07/19 1223  WBC 6.3  HGB 14.8  HCT 43.1  PLT 153    ABG No results for input(s): PHART, PCO2ART, PO2ART in the last 168 hours.  Coag's No results for input(s): APTT, INR in the last 168 hours.  Sepsis Markers No results for input(s): LATICACIDVEN, PROCALCITON, O2SATVEN in the last 168 hours.  Cardiac Enzymes No results for input(s): TROPONINI, PROBNP in the last 168 hours.  PAST MEDICAL HISTORY :   He  has a past medical history of Cancer ([place]), Hiatal hernia, colonic polyps, Hyperlipidemia, Hyperuricemia without signs inflammatory arthritis/tophaceous disease (  2012), Sciatica, and Spinal stenosis.  PAST SURGICAL HISTORY:  He  has a past surgical history that includes Upper gastrointestinal endoscopy (2008); Inguinal hernia repair (1968); Hemorrhoid surgery (1986); Tonsillectomy and adenoidectomy (1949); colonoscopy with polypectomy (2005 & 2008); Vasectomy; and steroid spinal injections (2013).  Allergies  Allergen Reactions  . Dexlansoprazole Nausea And Vomiting  . Nexium [Esomeprazole Magnesium]     Nausea & vomiting    No current facility-administered medications on file prior to encounter.   Current Outpatient Medications on File Prior to Encounter  Medication Sig  . amLODipine (NORVASC) 5 MG tablet Take 2 tablets (10 mg total) by mouth daily. Overdue for annual appt w/labs must see provider for refills  . Apoaequorin (PREVAGEN PO) Take 1 capsule by mouth daily.  Marland Kitchen bismuth subsalicylate (PEPTO BISMOL) 262 MG chewable tablet Chew 524 mg by mouth as needed for indigestion.  . Ergocalciferol (VITAMIN D2) 400 units TABS Take 1  tablet by mouth daily.  . fluticasone (FLONASE) 50 MCG/ACT nasal spray Place 2 sprays into both nostrils at bedtime.  Marland Kitchen guaiFENesin (MUCINEX) 600 MG 12 hr tablet Take 600 mg by mouth daily.  Marland Kitchen guaiFENesin (ROBITUSSIN) 100 MG/5ML liquid Take 100 mg by mouth at bedtime.  Marland Kitchen losartan (COZAAR) 100 MG tablet Take 1 tablet (100 mg total) by mouth daily. Must keep August appt for future refills  . Multiple Vitamin (MULTI-VITAMINS) TABS Take 1 tablet by mouth daily.  . Probiotic Product (PROBIOTIC ACIDOPHILUS BEADS PO) Take 1 capsule by mouth daily.  . Rivaroxaban (XARELTO) 15 MG TABS tablet Take 15 mg by mouth 2 (two) times daily with a meal.  . rosuvastatin (CRESTOR) 20 MG tablet TAKE ONE TABLET AT BEDTIME (Patient taking differently: Take 20 mg by mouth at bedtime. )  . vitamin B-12 (CYANOCOBALAMIN) 1000 MCG tablet Take 1,000 mcg by mouth daily.  . cyclobenzaprine (FLEXERIL) 5 MG tablet Take 1 tablet (5 mg total) by mouth 2 (two) times daily as needed for muscle spasms (may cause drowsiness). (Patient not taking: Reported on 12/10/2017)  . gabapentin (NEURONTIN) 300 MG capsule Take 1 capsule (300 mg total) by mouth at bedtime.  . naproxen (NAPROSYN) 500 MG tablet Take 1 tablet (500 mg total) by mouth 2 (two) times daily as needed. Take with food. (Patient not taking: Reported on 05/07/2019)    FAMILY HISTORY:   His family history includes Heart attack (age of onset: 37) in his father; Stroke in his maternal grandfather, maternal grandmother, and maternal uncle; Stroke (age of onset: 12) in his mother. There is no history of Diabetes.  SOCIAL HISTORY:  He  reports that he quit smoking about 51 years ago. He has never used smokeless tobacco. He reports current alcohol use of about 14.0 standard drinks of alcohol per week. He reports that he does not use drugs.  REVIEW OF SYSTEMS:    ROS reviewed and negative except as stated in HPI.    Harvie Heck, MD Internal Medicine, PGY-1 Pager:  [phone] 05/07/2019 3:55 PM  Attending Note:  79 year old male with massive PE that is unprovoked.  On xarelto and taken this AM.  On exam, comfortable but sat of 94% with clear lungs.  No lower ext edema noted.  I reviewed chest CT myself, saddle PE noted.  Discussed with resident.  Patient is clearly in no distress but has such a large clot burden that I am concern that another would result in his death.  Will change to IV heparin and hold Xarelto.  Discussed with cards, IR and patient.  The conclusion was to proceed with EKOS.  Patient is aware of risk and has made that choice.  Will admit to the ICU for close observation and proceed with EKOS in AM after xarelto has been held for 24 hours.  Lower ext dopplers as ordered.  PCCM will follow up.  The patient is critically ill with multiple organ systems failure and requires high complexity decision making for assessment and support, frequent evaluation and titration of therapies, application of advanced monitoring technologies and extensive interpretation of multiple databases.   Critical Care Time devoted to patient care services described in this note is  35  Minutes. This time reflects time of care of this signee Dr Jennet Maduro. This critical care time does not reflect procedure time, or teaching time or supervisory time of PA/NP/Med student/Med Resident etc but could involve care discussion time.  Rush Farmer, M.D. [facility] Pulmonary/Critical Care Medicine.

## 2019-05-07 NOTE — Progress Notes (Signed)
  Echocardiogram 2D Echocardiogram has been performed.  [name] 05/07/2019, 2:59 PM

## 2019-05-07 NOTE — ED Provider Notes (Signed)
[facility] EMERGENCY DEPARTMENT Provider Note   CSN: [id number] Arrival date & time: 05/07/19  1152     History Chief Complaint  Patient presents with  . pulmonary emboli    James Noble is a 79 y.o. male.  78 yo M with a chief complaints of shortness of breath on exertion.  This been going on for about a month.  Has been seeing his family doctor for this.  An outpatient CT scan that was concerning for a pulmonary embolism.  He denies history of the same.  Denies history of DVT.  Denies history of cancer.  Denies hemoptysis.  Has had a mild cough with this going on for about the same amount of time.  Has been able to exercise but less than normal.  Denies chest pain or pressure.  Denies history of cancer.  The history is provided by the patient.  Illness Severity:  Moderate Onset quality:  Gradual Duration:  4 weeks Timing:  Constant Progression:  Worsening Chronicity:  New Associated symptoms: shortness of breath   Associated symptoms: no abdominal pain, no chest pain, no congestion, no diarrhea, no fever, no headaches, no myalgias, no rash and no vomiting        Past Medical History:  Diagnosis Date  . Cancer (HCC)    BASAL CELL , DR.LOMAX  . Hiatal hernia   . Hx of colonic polyps    DR.MEDOFF  . Hyperlipidemia    BASED ON NMR;GILBERTS SYNDROME  . Hyperuricemia without signs inflammatory arthritis/tophaceous disease 2012   uric acid 7.7  . Sciatica   . Spinal stenosis    OF LS SPINE...DR.LOVE    Patient Active Problem List   Diagnosis Date Noted  . Dysphonia 11/17/2015  . Laryngospasms 11/17/2015  . Muscle tension dysphonia 11/17/2015  . 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 Hiatal hernia 10/14/2015  . GERD (gastroesophageal reflux disease) 10/14/2015  . Hyperglycemia 10/14/2015  . Thrombocytopenia ([place]) 03/19/2012  . HTN (hypertension) 08/21/2011  . ARTHRALGIA 09/03/2009  . NONSPECIFIC ABNORMAL ELECTROCARDIOGRAM 09/03/2009  . HYPERPLASIA  PROSTATE UNS W/O UR OBST & OTH LUTS 08/28/2008  . SKIN CANCER, HX OF 08/28/2008  . History of colonic polyps 08/28/2008  . SPINAL STENOSIS, LUMBAR 07/19/2007  . Hyperlipidemia 04/26/2007  . GILBERT'S SYNDROME 04/26/2007  . RBBB 04/26/2007    Past Surgical History:  Procedure Laterality Date  . colonoscopy with polypectomy  2005 & 2008   Dr Earlean Shawl  . [place]  . INGUINAL HERNIA REPAIR  1968  . steroid spinal injections  2013   X2, Dr Ernestina Patches  . TONSILLECTOMY AND ADENOIDECTOMY  1949  . UPPER GASTROINTESTINAL ENDOSCOPY  2008   Dr Earlean Shawl; Hiatal hernia  . VASECTOMY         Family History  Problem Relation Age of Onset  . Heart attack Father 3        RHD, smoker  . Stroke Maternal Grandmother        late 82s  . Stroke Mother 51       smoker  . Stroke Maternal Uncle        X 3  . Stroke Maternal Grandfather   . Diabetes Neg Hx     Social History   Tobacco Use  . Smoking status: Former Smoker    Quit date: 05/01/1968    Years since quitting: 51.0  . Smokeless tobacco: Never Used  . Tobacco comment: smoked 1960-1970, up to 1 ppd  Substance Use Topics  .  Alcohol use: Yes    Alcohol/week: 14.0 standard drinks    Types: 14 Glasses of wine per week    Comment:  socially  . Drug use: No    Home Medications Prior to Admission medications   Medication Sig Start Date End Date Taking? Authorizing Provider  amLODipine (NORVASC) 5 MG tablet Take 2 tablets (10 mg total) by mouth daily. Overdue for annual appt w/labs must see provider for refills 11/14/16   Binnie Rail, MD  aspirin EC 81 MG tablet Take 81 mg by mouth daily.    [provider]  cyclobenzaprine (FLEXERIL) 5 MG tablet Take 1 tablet (5 mg total) by mouth 2 (two) times daily as needed for muscle spasms (may cause drowsiness). Patient not taking: Reported on 12/10/2017 04/28/16   NcheCharlene Brooke, NP  Ergocalciferol (VITAMIN D2) 400 units TABS Take 1 tablet by mouth daily.    [provider]  gabapentin (NEURONTIN) 300 MG capsule Take 1 capsule (300 mg total) by mouth at bedtime. 07/15/18   Thurman Coyer, DO  losartan (COZAAR) 100 MG tablet Take 1 tablet (100 mg total) by mouth daily. Must keep August appt for future refills 11/07/16   Binnie Rail, MD  Multiple Vitamin (MULTI-VITAMINS) TABS Take 1 tablet by mouth daily.    [provider]  naproxen (NAPROSYN) 500 MG tablet Take 1 tablet (500 mg total) by mouth 2 (two) times daily as needed. Take with food. 06/17/18   Thurman Coyer, DO  ranitidine (ZANTAC) 300 MG tablet Take 150 mg by mouth daily as needed for heartburn.  10/08/15   [provider]  rosuvastatin (CRESTOR) 20 MG tablet TAKE ONE TABLET AT BEDTIME 09/04/16   Burns, Claudina Lick, MD    Allergies    Dexlansoprazole and Nexium [esomeprazole magnesium]  Review of Systems   Review of Systems  Constitutional: Negative for chills and fever.  HENT: Negative for congestion and facial swelling.   Eyes: Negative for discharge and visual disturbance.  Respiratory: Positive for shortness of breath.   Cardiovascular: Negative for chest pain and palpitations.  Gastrointestinal: Negative for abdominal pain, diarrhea and vomiting.  Musculoskeletal: Negative for arthralgias and myalgias.  Skin: Negative for color change and rash.  Neurological: Negative for tremors, syncope and headaches.  Psychiatric/Behavioral: Negative for confusion and dysphoric mood.    Physical Exam Updated Vital Signs BP (!) 162/94 (BP Location: Left Arm)   Pulse 79   Temp 98.2 F (36.8 C) (Oral)   Resp 18   SpO2 97%   Physical Exam Vitals and nursing note reviewed.  Constitutional:      Appearance: He is well-developed.  HENT:     Head: Normocephalic and atraumatic.  Eyes:     Pupils: Pupils are equal, round, and reactive to light.  Neck:     Vascular: No JVD.  Cardiovascular:     Rate and Rhythm: Normal rate and regular rhythm.     Heart sounds: No  murmur. No friction rub. No gallop.   Pulmonary:     Effort: No respiratory distress.     Breath sounds: No wheezing.  Abdominal:     General: There is no distension.     Tenderness: There is no abdominal tenderness. There is no guarding or rebound.  Musculoskeletal:        General: Normal range of motion.     Cervical back: Normal range of motion and neck supple.     Left lower leg: Edema present.  Comments: Left leg swelling going on for about 2 weeks.  Skin:    Coloration: Skin is not pale.     Findings: No rash.  Neurological:     Mental Status: He is alert and oriented to person, place, and time.  Psychiatric:        Behavior: Behavior normal.     ED Results / Procedures / Treatments   Labs (all labs ordered are listed, but only abnormal results are displayed) Labs Reviewed  CBC WITH DIFFERENTIAL/PLATELET  BASIC METABOLIC PANEL  BRAIN NATRIURETIC PEPTIDE  TROPONIN I (HIGH SENSITIVITY)    EKG EKG Interpretation  Date/Time:  Wednesday May 07 2019 11:58:44 EST Ventricular Rate:  72 PR Interval:  146 QRS Duration: 120 QT Interval:  444 QTC Calculation: 486 R Axis:   95 Text Interpretation: Sinus rhythm with Premature atrial complexes Right bundle branch block Possible Lateral infarct , age undetermined T wave abnormality, consider inferior ischemia Abnormal ECG No old tracing to compare Confirmed by Deno Etienne [phone]) on 05/07/2019 12:21:22 PM   Radiology DG Chest 2 View  Result Date: 05/06/2019 CLINICAL DATA:  Dyspnea on exertion for several weeks EXAM: CHEST - 2 VIEW COMPARISON:  03/12/2014 FINDINGS: The heart size and mediastinal contours are within normal limits. Both lungs are clear. The visualized skeletal structures are unremarkable. IMPRESSION: No active cardiopulmonary disease. Electronically Signed   By: Inez Catalina M.D.   On: 05/06/2019 15:23   CT ANGIO CHEST PE W OR WO CONTRAST  Result Date: 05/07/2019 CLINICAL DATA:  Shortness of breath EXAM: CT  ANGIOGRAPHY CHEST WITH CONTRAST TECHNIQUE: Multidetector CT imaging of the chest was performed using the standard protocol during bolus administration of intravenous contrast. Multiplanar CT image reconstructions and MIPs were obtained to evaluate the vascular anatomy. CONTRAST:  96mL ISOVUE-370 IOPAMIDOL (ISOVUE-370) INJECTION 76% COMPARISON:  Chest radiograph May 06, 2019 FINDINGS: Cardiovascular: There is extensive pulmonary embolus, arising in both main pulmonary arteries in a saddle type distribution with extension of pulmonary emboli into multiple upper and lower lobe pulmonary artery branches bilaterally. The right ventricle to left ventricle diameter ratio is less than 0.9, not indicative of right heart strain. There is no thoracic aortic aneurysm or dissection. Visualized great vessels demonstrate occasional mild foci of calcification. Note that the right innominate and left common carotid arteries arise as a common trunk, an anatomic variant. There is aortic atherosclerosis as well as foci of coronary artery calcification. There is no pericardial effusion or pericardial thickening. Mediastinum/Nodes: Thyroid appears unremarkable. There is no appreciable thoracic adenopathy by size criteria. There are occasional subcentimeter mediastinal lymph nodes which do not meet size criteria for pathologic significance. There is a small hiatal hernia. Lungs/Pleura: There are scattered areas of atelectatic change. No evident consolidation or pulmonary infarct. No pleural effusions are evident. Upper Abdomen: There is upper abdominal aortic atherosclerosis. Visualized upper abdominal structures otherwise appear unremarkable. Musculoskeletal: There are foci of degenerative change in the thoracic spine. No blastic or lytic bone lesions are evident. There are no chest wall lesions. Review of the MIP images confirms the above findings. IMPRESSION: 1. Extensive bilateral pulmonary emboli. Pulmonary emboli arise from both  main pulmonary outflow tracks in a saddle type distribution. Right ventricle to left ventricular diameter is less than 0.9, not indicative of right heart strain. 2. No thoracic aortic aneurysm or dissection. There is aortic atherosclerosis as well as foci of coronary artery calcification. 3. Areas of scattered atelectasis. No edema or consolidation. No evident pulmonary infarct. 4.  Small hiatal hernia. 5.  No adenopathy. Critical Value/emergent results were called by telephone at the time of interpretation on 05/07/2019 at 11:03 am to providerJOHN GRIFFIN , who verbally acknowledged these results. Aortic Atherosclerosis (ICD10-I70.0). Electronically Signed   By: Lowella Grip III M.D.   On: 05/07/2019 11:03    Procedures Procedures (including critical care time)  Medications Ordered in ED Medications - No data to display  ED Course  I have reviewed the triage vital signs and the nursing notes.  Pertinent labs & imaging results that were available during my care of the patient were reviewed by me and considered in my medical decision making (see chart for details).    MDM Rules/Calculators/A&P                      79 yo M with chief complaints of shortness of breath on exertion.  Patient was found to have a saddle embolus on his CT scan and his family doctor sent him here for admission.  Will obtain wrist revocation lab work.  Patient is well appearing and nontoxic.  No shortness of breath while at rest.  No hypotension.  Patient is a patient of Dr. Marlou Porch, saw the patient was in the ED and evaluated him and will admit him to his service.  CRITICAL CARE Performed by: Cecilio Asper   Total critical care time: 35 minutes  Critical care time was exclusive of separately billable procedures and treating other patients.  Critical care was necessary to treat or prevent imminent or life-threatening deterioration.  Critical care was time spent personally by me on the following  activities: development of treatment plan with patient and/or surrogate as well as nursing, discussions with consultants, evaluation of patient's response to treatment, examination of patient, obtaining history from patient or surrogate, ordering and performing treatments and interventions, ordering and review of laboratory studies, ordering and review of radiographic studies, pulse oximetry and re-evaluation of patient's condition.   The patients results and plan were reviewed and discussed.   Any x-rays performed were independently reviewed by myself.   Differential diagnosis were considered with the presenting HPI.  Medications - No data to display  Vitals:   05/07/19 1154 05/07/19 1245  BP: (!) 162/94 (!) 146/83  Pulse: 79 70  Resp: 18 15  Temp: 98.2 F (36.8 C)   TempSrc: Oral   SpO2: 97% 94%    Final diagnoses:  None    Admission/ observation were discussed with the admitting physician, patient and/or family and they are comfortable with the plan.   Final Clinical Impression(s) / ED Diagnoses Final diagnoses:  None    Rx / DC Orders ED Discharge Orders    None       Deno Etienne, DO 05/07/19 1419

## 2019-05-08 ENCOUNTER — Inpatient Hospital Stay (HOSPITAL_COMMUNITY): Payer: Medicare Other

## 2019-05-08 DIAGNOSIS — I2692 Saddle embolus of pulmonary artery without acute cor pulmonale: Principal | ICD-10-CM

## 2019-05-08 DIAGNOSIS — I2699 Other pulmonary embolism without acute cor pulmonale: Secondary | ICD-10-CM

## 2019-05-08 DIAGNOSIS — I82409 Acute embolism and thrombosis of unspecified deep veins of unspecified lower extremity: Secondary | ICD-10-CM

## 2019-05-08 HISTORY — DX: Acute embolism and thrombosis of unspecified deep veins of unspecified lower extremity: I82.409

## 2019-05-08 HISTORY — PX: IR IVC FILTER PLMT / S&I /IMG GUID/MOD SED: IMG701

## 2019-05-08 LAB — BASIC METABOLIC PANEL
Anion gap: 8 (ref 5–15)
BUN: 8 mg/dL (ref 8–23)
CO2: 26 mmol/L (ref 22–32)
Calcium: 9.3 mg/dL (ref 8.9–10.3)
Chloride: 106 mmol/L (ref 98–111)
Creatinine, Ser: 0.79 mg/dL (ref 0.61–1.24)
GFR calc Af Amer: >60 mL/min (ref >60)
GFR calc non Af Amer: >60 mL/min (ref >60)
Glucose, Bld: 104 mg/dL — ABNORMAL HIGH (ref 70–99)
Potassium: 4.1 mmol/L (ref 3.5–5.1)
Sodium: 140 mmol/L (ref 135–145)

## 2019-05-08 LAB — CBC
HCT: 43 % (ref 39.0–52.0)
Hemoglobin: 14.5 g/dL (ref 13.0–17.0)
MCH: 34.9 pg — ABNORMAL HIGH (ref 26.0–34.0)
MCHC: 33.7 g/dL (ref 30.0–36.0)
MCV: 103.4 fL — ABNORMAL HIGH (ref 80.0–100.0)
Platelets: 158 10*3/uL (ref 150–400)
RBC: 4.16 MIL/uL — ABNORMAL LOW (ref 4.22–5.81)
RDW: 13.2 % (ref 11.5–15.5)
WBC: 5.6 10*3/uL (ref 4.0–10.5)
nRBC: 0 % (ref 0.0–0.2)

## 2019-05-08 LAB — HEPARIN LEVEL (UNFRACTIONATED): Heparin Unfractionated: 1.46 IU/mL — ABNORMAL HIGH (ref 0.30–0.70)

## 2019-05-08 LAB — APTT: aPTT: 84 seconds — ABNORMAL HIGH (ref 24–36)

## 2019-05-08 LAB — MRSA PCR SCREENING: MRSA by PCR: NEGATIVE

## 2019-05-08 MED ORDER — IOHEXOL 300 MG/ML  SOLN
100.0000 mL | Freq: Once | INTRAMUSCULAR | Status: AC | PRN
  Administered 2019-05-08: 50 mL via INTRAVENOUS

## 2019-05-08 MED ORDER — MIDAZOLAM HCL 2 MG/2ML IJ SOLN
INTRAMUSCULAR | Status: AC | PRN
  Administered 2019-05-08: 1 mg via INTRAVENOUS

## 2019-05-08 MED ORDER — CHLORHEXIDINE GLUCONATE CLOTH 2 % EX PADS
6.0000 | MEDICATED_PAD | Freq: Every day | CUTANEOUS | Status: DC
  Administered 2019-05-08 – 2019-05-09 (×2): 6 via TOPICAL

## 2019-05-08 MED ORDER — CHLORHEXIDINE GLUCONATE CLOTH 2 % EX PADS: 6.0000 | MEDICATED_PAD | Freq: Every day | CUTANEOUS | Status: DC

## 2019-05-08 MED ORDER — FENTANYL CITRATE (PF) 100 MCG/2ML IJ SOLN
INTRAMUSCULAR | Status: AC | PRN
  Administered 2019-05-08: 50 ug via INTRAVENOUS

## 2019-05-08 MED ORDER — MIDAZOLAM HCL 2 MG/2ML IJ SOLN
INTRAMUSCULAR | Status: AC
  Filled 2019-05-08: qty 2

## 2019-05-08 MED ORDER — FENTANYL CITRATE (PF) 100 MCG/2ML IJ SOLN
INTRAMUSCULAR | Status: AC
  Filled 2019-05-08: qty 2

## 2019-05-08 MED ORDER — LIDOCAINE HCL 1 % IJ SOLN
INTRAMUSCULAR | Status: AC
  Filled 2019-05-08: qty 20

## 2019-05-08 NOTE — Consult Note (Signed)
Chief Complaint: Patient was seen in consultation today for IVC filter placement.   Referring Physician(s): Dr. Marva Panda  Supervising Physician: Markus Daft  Patient Status: [facility] - In-pt  History of Present Illness: James Noble is a 79 y.o. male with a past medical history of HLD, HTN, spinal stenosis, basal cell carcinoma who presented to [facility] ED on 05/07/19 with progressive dyspnea. He was initially seen by his PCP and was sent for outpatient CTA which noted extensive bilateral PE arising from both main pulmonary outflow tracks in a saddle type distribution, no indicating of right heart strain on CT. Additionally he was given a dose of Xarelto on the evening of 1/5 and AM of 1/6. He was seen to the ED and subsequently was admitted for further evaluation and management. IR was initially consulted for possible PE lysis however because the vast majority of the thrombus burden is non occlusive without evidence of right heart strain decision was made to continue heparin gtt with close observation and if right heart strain develops would potentially proceed with PE lysis. Echocardiogram was performed last night which was essentially normal. BLE [place] was performed this morning and notable for acute DVT of the left femoral, popliteal, posterior tibial and peroneal veins. IR has been consulted for IVC filter placement due to new findings of extensive acute DVT.  Dr. Mervin Kung was seen in his room in conjunction with Dr. Anselm Pancoast, he states he feels well overall although he does still feel short of breath with exertion - none while laying in bed. 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 He is typically quite active and is hopeful that he can return to his previous activity levels once PE/DVT has resolved. He is familiar with IVC filters and is agreeable to have one placed today.   Past Medical History:  Diagnosis Date    . Cancer (HCC)    BASAL CELL , DR.LOMAX  . Hiatal hernia   . Hx of colonic polyps    DR.MEDOFF  . Hyperlipidemia    BASED ON NMR;GILBERTS SYNDROME  . Hyperuricemia without signs inflammatory arthritis/tophaceous disease 2012   uric acid 7.7  . Sciatica   . Spinal stenosis    OF LS SPINE...DR.LOVE    Past Surgical History:  Procedure Laterality Date  . colonoscopy with polypectomy  2005 & 2008   Dr Earlean Shawl  . [place]  . INGUINAL HERNIA REPAIR  1968  . steroid spinal injections  2013   X2, Dr Ernestina Patches  . TONSILLECTOMY AND ADENOIDECTOMY  1949  . UPPER GASTROINTESTINAL ENDOSCOPY  2008   Dr Earlean Shawl; Hiatal hernia  . VASECTOMY      Allergies: Dexlansoprazole and Nexium [esomeprazole magnesium]  Medications: Prior to Admission medications   Medication Sig Start Date End Date Taking? Authorizing Provider  amLODipine (NORVASC) 5 MG tablet Take 2 tablets (10 mg total) by mouth daily. Overdue for annual appt w/labs must see provider for refills 11/14/16  Yes Burns, Claudina Lick, MD  Apoaequorin (PREVAGEN PO) Take 1 capsule by mouth daily.   Yes [provider]  bismuth subsalicylate (PEPTO BISMOL) 262 MG chewable tablet Chew 524 mg by mouth as needed for indigestion.   Yes [provider]  Ergocalciferol (VITAMIN D2) 400 units TABS Take 1 tablet by mouth daily.   Yes [provider]  fluticasone (FLONASE) 50 MCG/ACT nasal spray Place 2 sprays into  both nostrils at bedtime.   Yes [provider]  guaiFENesin (MUCINEX) 600 MG 12 hr tablet Take 600 mg by mouth daily.   Yes [provider]  guaiFENesin (ROBITUSSIN) 100 MG/5ML liquid Take 100 mg by mouth at bedtime.   Yes [provider]  losartan (COZAAR) 100 MG tablet Take 1 tablet (100 mg total) by mouth daily. Must keep August appt for future refills 11/07/16  Yes Burns, Claudina Lick, MD  Multiple Vitamin (MULTI-VITAMINS) TABS Take 1 tablet by mouth daily.   Yes [provider]  Probiotic Product (PROBIOTIC ACIDOPHILUS BEADS PO) Take 1 capsule by mouth daily.   Yes [provider]  Rivaroxaban (XARELTO) 15 MG TABS tablet Take 15 mg by mouth 2 (two) times daily with a meal.   Yes [provider]  rosuvastatin (CRESTOR) 20 MG tablet TAKE ONE TABLET AT BEDTIME Patient taking differently: Take 20 mg by mouth at bedtime.  09/04/16  Yes Burns, Claudina Lick, MD  vitamin B-12 (CYANOCOBALAMIN) 1000 MCG tablet Take 1,000 mcg by mouth daily.   Yes [provider]  cyclobenzaprine (FLEXERIL) 5 MG tablet Take 1 tablet (5 mg total) by mouth 2 (two) times daily as needed for muscle spasms (may cause drowsiness). Patient not taking: Reported on 12/10/2017 04/28/16   Nche, Charlene Brooke, NP  gabapentin (NEURONTIN) 300 MG capsule Take 1 capsule (300 mg total) by mouth at bedtime. 07/15/18   Thurman Coyer, DO  naproxen (NAPROSYN) 500 MG tablet Take 1 tablet (500 mg total) by mouth 2 (two) times daily as needed. Take with food. Patient not taking: Reported on 05/07/2019 06/17/18   Thurman Coyer, DO     Family History  Problem Relation Age of Onset  . Heart attack Father 57        RHD, smoker  . Stroke Maternal Grandmother        late 94s  . Stroke Mother 81       smoker  . Stroke Maternal Uncle        X 3  . Stroke Maternal Grandfather   . Diabetes Neg Hx     Social History   Socioeconomic History  . Marital status: Married    Spouse name: Not on file  . Number of children: Not on file  . Years of education: Not on file  . Highest education level: Not on file  Occupational History  . Occupation: ORTHODONTIST  Tobacco Use  . Smoking status: Former Smoker    Quit date: 05/01/1968    Years since quitting: 51.0  . Smokeless tobacco: Never Used  . Tobacco comment: smoked 1960-1970, up to 1 ppd  Substance and Sexual Activity  . Alcohol use: Yes    Alcohol/week: 14.0 standard drinks    Types: 14 Glasses of wine per week    Comment:   socially  . Drug use: No  . Sexual activity: Not on file  Other Topics Concern  . Not on file  Social History Narrative   HEART HEALTHY DIET   EXERCISES 4-6 TIME WEEKLY   Social Determinants of Health   Financial Resource Strain:   . Difficulty of Paying Living Expenses: Not on file  Food Insecurity:   . Worried About Charity fundraiser in the Last Year: Not on file  . Ran Out of Food in the Last Year: Not on file  Transportation Needs:   . Lack of Transportation (Medical): Not on file  . Lack of Transportation (Non-Medical): Not  on file  Physical Activity:   . Days of Exercise per Week: Not on file  . Minutes of Exercise per Session: Not on file  Stress:   . Feeling of Stress : Not on file  Social Connections:   . Frequency of Communication with Friends and Family: Not on file  . Frequency of Social Gatherings with Friends and Family: Not on file  . Attends Religious Services: Not on file  . Active Member of Clubs or Organizations: Not on file  . Attends Archivist Meetings: Not on file  . Marital Status: Not on file     Review of Systems: A 12 point ROS discussed and pertinent positives are indicated in the HPI above.  All other systems are negative.  Review of Systems  Constitutional: Negative for chills and fever.  Respiratory: Positive for shortness of breath (with exertion - none while laying in bed). Negative for cough.   Cardiovascular: Positive for leg swelling. Negative for chest pain.  Gastrointestinal: Negative for abdominal pain, diarrhea, nausea and vomiting.  Skin: Negative for wound.  Neurological: Negative for dizziness and headaches.    Vital Signs: BP (!) 144/81 (BP Location: Left Arm)   Pulse 69   Temp (!) 97.5 F (36.4 C) (Oral)   Resp (!) 22   Ht 5\' 11"  (1.803 m)   Wt 196 lb (88.9 kg)   SpO2 96%   BMI 27.34 kg/m   Physical Exam Vitals and nursing note reviewed.  Constitutional:      General: He is not in acute  distress. HENT:     Head: Normocephalic.     Mouth/Throat:     Mouth: Mucous membranes are moist.     Pharynx: Oropharynx is clear. No oropharyngeal exudate or posterior oropharyngeal erythema.  Cardiovascular:     Rate and Rhythm: Normal rate and regular rhythm.  Pulmonary:     Effort: Pulmonary effort is normal.     Breath sounds: Normal breath sounds.  Abdominal:     Palpations: Abdomen is soft.  Skin:    General: Skin is warm and dry.  Neurological:     Mental Status: He is alert and oriented to person, place, and time.  Psychiatric:        Mood and Affect: Mood normal.        Behavior: Behavior normal.        Thought Content: Thought content normal.        Judgment: Judgment normal.      MD Evaluation Airway: WNL Heart: WNL Abdomen: WNL Chest/ Lungs: WNL ASA  Classification: 3 Mallampati/Airway Score: One   Imaging: DG Chest 2 View  Result Date: 05/06/2019 CLINICAL DATA:  Dyspnea on exertion for several weeks EXAM: CHEST - 2 VIEW COMPARISON:  03/12/2014 FINDINGS: The heart size and mediastinal contours are within normal limits. Both lungs are clear. The visualized skeletal structures are unremarkable. IMPRESSION: No active cardiopulmonary disease. Electronically Signed   By: Inez Catalina M.D.   On: 05/06/2019 15:23   CT ANGIO CHEST PE W OR WO CONTRAST  Result Date: 05/07/2019 CLINICAL DATA:  Shortness of breath EXAM: CT ANGIOGRAPHY CHEST WITH CONTRAST TECHNIQUE: Multidetector CT imaging of the chest was performed using the standard protocol during bolus administration of intravenous contrast. Multiplanar CT image reconstructions and MIPs were obtained to evaluate the vascular anatomy. CONTRAST:  82mL ISOVUE-370 IOPAMIDOL (ISOVUE-370) INJECTION 76% COMPARISON:  Chest radiograph May 06, 2019 FINDINGS: Cardiovascular: There is extensive pulmonary embolus, arising in both main pulmonary  arteries in a saddle type distribution with extension of pulmonary emboli into multiple  upper and lower lobe pulmonary artery branches bilaterally. The right ventricle to left ventricle diameter ratio is less than 0.9, not indicative of right heart strain. There is no thoracic aortic aneurysm or dissection. Visualized great vessels demonstrate occasional mild foci of calcification. Note that the right innominate and left common carotid arteries arise as a common trunk, an anatomic variant. There is aortic atherosclerosis as well as foci of coronary artery calcification. There is no pericardial effusion or pericardial thickening. Mediastinum/Nodes: Thyroid appears unremarkable. There is no appreciable thoracic adenopathy by size criteria. There are occasional subcentimeter mediastinal lymph nodes which do not meet size criteria for pathologic significance. There is a small hiatal hernia. Lungs/Pleura: There are scattered areas of atelectatic change. No evident consolidation or pulmonary infarct. No pleural effusions are evident. Upper Abdomen: There is upper abdominal aortic atherosclerosis. Visualized upper abdominal structures otherwise appear unremarkable. Musculoskeletal: There are foci of degenerative change in the thoracic spine. No blastic or lytic bone lesions are evident. There are no chest wall lesions. Review of the MIP images confirms the above findings. IMPRESSION: 1. Extensive bilateral pulmonary emboli. Pulmonary emboli arise from both main pulmonary outflow tracks in a saddle type distribution. Right ventricle to left ventricular diameter is less than 0.9, not indicative of right heart strain. 2. No thoracic aortic aneurysm or dissection. There is aortic atherosclerosis as well as foci of coronary artery calcification. 3. Areas of scattered atelectasis. No edema or consolidation. No evident pulmonary infarct. 4.  Small hiatal hernia. 5.  No adenopathy. Critical Value/emergent results were called by telephone at the time of interpretation on 05/07/2019 at 11:03 am to providerJOHN GRIFFIN ,  who verbally acknowledged these results. Aortic Atherosclerosis (ICD10-I70.0). Electronically Signed   By: Lowella Grip III M.D.   On: 05/07/2019 11:03   ECHOCARDIOGRAM COMPLETE  Result Date: 05/07/2019   ECHOCARDIOGRAM REPORT   Patient Name:   TARY DIEUDONNE Date of Exam: 05/07/2019 Medical Rec #:  [id number]     Height:       71.0 in Accession #:    [id number]    Weight:       185.0 lb Date of Birth:  03/15/1941      BSA:          2.04 m Patient Age:    28 years      BP:           146/83 mmHg Patient Gender: M             HR:           75 bpm. Exam Location:  Inpatient Procedure: 2D Echo, Cardiac Doppler and Color Doppler Indications:    R06.02 SOB; I26.02 Pulmonary embolus  History:        Patient has no prior history of Echocardiogram examinations.                 Arrythmias:RBBB, Signs/Symptoms:Dyspnea; Risk                 Factors:Hypertension and Dyslipidemia.  Sonographer:    Roseanna Rainbow RDCS Referring Phys: [id number] MARK C SKAINS  Sonographer Comments: Suboptimal subcostal window. IMPRESSIONS  1. Left ventricular ejection fraction, by visual estimation, is 55 to 60%. The left ventricle has normal function. There is moderately increased left ventricular hypertrophy.  2. Moderate asymmetric hypertrophy measuring 88mm in basal septum (39mm in posterior wall)  3. Global right ventricle has normal  systolic function.The right ventricular size is mildly enlarged.  4. The mitral valve is normal in structure. No evidence of mitral valve regurgitation.  5. The aortic valve is tricuspid. Aortic valve regurgitation is trivial. Mild aortic valve sclerosis without stenosis.  6. The tricuspid valve is normal in structure.  7. The pulmonic valve was not well visualized. Pulmonic valve regurgitation is trivial.  8. Left atrial size was normal.  9. Right atrial size was normal. 10. The inferior vena cava is normal in size with greater than 50% respiratory variability, suggesting right atrial pressure of 3 mmHg. 11. TR signal is  inadequate for assessing pulmonary artery systolic pressure. 12. Thrombus seen at the bifurcation of the pulmonary arteries. FINDINGS  Left Ventricle: Left ventricular ejection fraction, by visual estimation, is 55 to 60%. The left ventricle has normal function. The left ventricle has no regional wall motion abnormalities. The left ventricular internal cavity size was the left ventricle is normal in size. There is moderately increased left ventricular hypertrophy. Asymmetric left ventricular hypertrophy. Left ventricular diastolic parameters were normal. Right Ventricle: The right ventricular size is mildly enlarged. No increase in right ventricular wall thickness. Global RV systolic function is has normal systolic function. Left Atrium: Left atrial size was normal in size. Right Atrium: Right atrial size was normal in size Pericardium: Trivial pericardial effusion is present. Mitral Valve: The mitral valve is normal in structure. No evidence of mitral valve regurgitation. Tricuspid Valve: The tricuspid valve is normal in structure. Tricuspid valve regurgitation is trivial. Aortic Valve: The aortic valve is tricuspid. Aortic valve regurgitation is trivial. Mild aortic valve sclerosis is present, with no evidence of aortic valve stenosis. Pulmonic Valve: The pulmonic valve was not well visualized. Pulmonic valve regurgitation is trivial. Pulmonic regurgitation is trivial. Aorta: The aortic root is normal in size and structure. Pulmonary Artery: Suspect thromboembolism in the main pulmonary artery. Venous: The inferior vena cava is normal in size with greater than 50% respiratory variability, suggesting right atrial pressure of 3 mmHg. IAS/Shunts: The interatrial septum was not well visualized.  LEFT VENTRICLE PLAX 2D LVIDd:         4.00 cm       Diastology LVIDs:         3.20 cm       LV e' lateral:   5.33 cm/s LV PW:         1.20 cm       LV E/e' lateral: 8.6 LV IVS:        1.70 cm       LV e' medial:    5.00 cm/s  LVOT diam:     2.00 cm       LV E/e' medial:  9.2 LV SV:         29 ml LV SV Index:   14.10 LVOT Area:     3.14 cm  LV Volumes (MOD) LV area d, A2C:    31.90 cm LV area d, A4C:    31.00 cm LV area s, A2C:    18.20 cm LV area s, A4C:    18.60 cm LV major d, A2C:   8.94 cm LV major d, A4C:   8.71 cm LV major s, A2C:   7.14 cm LV major s, A4C:   7.54 cm LV vol d, MOD A2C: 92.8 ml LV vol d, MOD A4C: 89.7 ml LV vol s, MOD A2C: 38.1 ml LV vol s, MOD A4C: 38.5 ml LV SV MOD A2C:  54.7 ml LV SV MOD A4C:     89.7 ml LV SV MOD BP:      53.1 ml RIGHT VENTRICLE             IVC RV S prime:     15.00 cm/s  IVC diam: 1.30 cm TAPSE (M-mode): 2.7 cm LEFT ATRIUM             Index       RIGHT ATRIUM           Index LA diam:        3.70 cm 1.81 cm/m  RA Area:     18.30 cm LA Vol (A2C):   42.2 ml 20.69 ml/m RA Volume:   51.90 ml  25.44 ml/m LA Vol (A4C):   37.7 ml 18.48 ml/m LA Biplane Vol: 41.8 ml 20.49 ml/m  AORTIC VALVE             PULMONIC VALVE LVOT Vmax:   107.00 cm/s PR End Diast Vel: 2.09 msec LVOT Vmean:  71.400 cm/s LVOT VTI:    0.206 m  AORTA Ao Root diam: 3.55 cm MITRAL VALVE MV Area (PHT): 2.62 cm             SHUNTS MV PHT:        83.81 msec           Systemic VTI:  0.21 m MV Decel Time: 289 msec             Systemic Diam: 2.00 cm MV E velocity: 46.00 cm/s 103 cm/s MV A velocity: 78.30 cm/s 70.3 cm/s MV E/A ratio:  0.59       1.5  Oswaldo Milian MD Electronically signed by Oswaldo Milian MD Signature Date/Time: 05/07/2019/9:38:08 PM    Final    VAS [place] LOWER EXTREMITY VENOUS (DVT)  Result Date: 05/08/2019  Lower Venous Study Indications: Pulmonary embolism.  Risk Factors: Surgery. Anticoagulation: Heparin. Comparison Study: No prior studies. Performing Technologist: Oliver Hum RVT  Examination Guidelines: A complete evaluation includes B-mode imaging, spectral Doppler, color Doppler, and power Doppler as needed of all accessible portions of each vessel. Bilateral testing is considered an  integral part of a complete examination. Limited examinations for reoccurring indications may be performed as noted.  +---------+---------------+---------+-----------+----------+--------------+ RIGHT    CompressibilityPhasicitySpontaneityPropertiesThrombus Aging +---------+---------------+---------+-----------+----------+--------------+ CFV      Full           Yes      Yes                                 +---------+---------------+---------+-----------+----------+--------------+ SFJ      Full                                                        +---------+---------------+---------+-----------+----------+--------------+ FV Prox  Full                                                        +---------+---------------+---------+-----------+----------+--------------+ FV Mid   Full                                                        +---------+---------------+---------+-----------+----------+--------------+  FV DistalFull                                                        +---------+---------------+---------+-----------+----------+--------------+ PFV      Full                                                        +---------+---------------+---------+-----------+----------+--------------+ POP      Full           Yes      Yes                                 +---------+---------------+---------+-----------+----------+--------------+ PTV      Full                                                        +---------+---------------+---------+-----------+----------+--------------+ PERO     Full                                                        +---------+---------------+---------+-----------+----------+--------------+   +---------+---------------+---------+-----------+----------+--------------+ LEFT     CompressibilityPhasicitySpontaneityPropertiesThrombus Aging +---------+---------------+---------+-----------+----------+--------------+ CFV       Full           Yes      Yes                                 +---------+---------------+---------+-----------+----------+--------------+ SFJ      Full                                                        +---------+---------------+---------+-----------+----------+--------------+ FV Prox  Partial        Yes      Yes                  Acute          +---------+---------------+---------+-----------+----------+--------------+ FV Mid   Partial        Yes      Yes                  Acute          +---------+---------------+---------+-----------+----------+--------------+ FV DistalNone           No       No                   Acute          +---------+---------------+---------+-----------+----------+--------------+ POP      Partial        No       No  Acute          +---------+---------------+---------+-----------+----------+--------------+ PTV      None                                         Acute          +---------+---------------+---------+-----------+----------+--------------+ PERO     None                                         Acute          +---------+---------------+---------+-----------+----------+--------------+     Summary: Right: No evidence of common femoral vein obstruction. Left: Findings consistent with acute deep vein thrombosis involving the left femoral vein, left popliteal vein, left posterior tibial veins, and left peroneal veins. No cystic structure found in the popliteal fossa.  *See table(s) above for measurements and observations.    Preliminary     Labs:  CBC: Recent Labs    05/07/19 1223 05/08/19 0721  WBC 6.3 5.6  HGB 14.8 14.5  HCT 43.1 43.0  PLT 153 158    COAGS: Recent Labs    05/07/19 2247 05/08/19 0721  APTT 98* 84*    BMP: Recent Labs    05/07/19 1223 05/08/19 0721  NA 138 140  K 4.3 4.1  CL 108 106  CO2 20* 26  GLUCOSE 108* 104*  BUN 11 8  CALCIUM 8.8* 9.3  CREATININE 0.83 0.79   GFRNONAA >60 >60  GFRAA >60 >60    LIVER FUNCTION TESTS: No results for input(s): BILITOT, AST, ALT, ALKPHOS, PROT, ALBUMIN in the last 8760 hours.  TUMOR MARKERS: No results for input(s): AFPTM, CEA, CA199, CHROMGRNA in the last 8760 hours.  Assessment and Plan:  79 y/o M with PMH as per HPI admitted with progressive dyspnea x 3-4 weeks found to have extensive bilateral PE arising from both main pulmonary outflow tracks in a saddle type distribution, no indicating of right heart strain on CT. IR was initially consulted for PE lysis yesterday however patient has been doing well on heparin gtt and no lysis indicated currently. Further inpatient workup showed extensive LLE DVT and IR has been asked to evaluate him for possible IVC filter placement due to significant clot burden and potential for further PE.  Patient seen at bedside with Dr. Anselm Pancoast today - discussed that PE lysis at this point would likely be minimally beneficial given the age of PEs, additionally he is hemodynamically stable on heparin gtt. We also discussed indications for IVC filter placement and that given his extensive clot burden there is a high risk of morbidity/mortality from further PE which would make prophylactic IVC filter placement a reasonable option for coverage while continuing anticoagulation. After thorough discussion of risks, benefits and alternatives Dr. Mervin Kung has elected to proceed with IVC filter placement.  Will plan for IVC filter placement later today in IR with moderate sedation.  Patient to remain NPO until post procedure, IR will call when ready, please call with any questions or concerns.  Risks and benefits discussed with the patient including, but not limited to bleeding, infection, contrast induced renal failure, filter fracture or migration which can lead to emergency surgery or even death, strut penetration with damage or irritation to adjacent structures and caval thrombosis.  All of the  patient's questions were  answered, patient is agreeable to proceed.  Consent signed and in chart.  Thank you for this interesting consult.  I greatly enjoyed meeting ARISTON MOERS and look forward to participating in their care.  A copy of this report was sent to the requesting provider on this date.  Electronically Signed: Joaquim Nam, PA-C 05/08/2019, 2:06 PM   I spent a total of 40 Minutes in face to face in clinical consultation, greater than 50% of which was counseling/coordinating care for IVC filter placement.

## 2019-05-08 NOTE — Progress Notes (Signed)
Bilateral lower extremity venous duplex has been completed. Preliminary results can be found in CV Proc through chart review.  Results were given to the patient's nurse, Aaron Edelman.  05/08/19 12:29 PM James Noble RVT

## 2019-05-08 NOTE — Procedures (Signed)
Interventional Radiology Procedure:   Indications: Saddle pulmonary embolism with left lower extremity DVT  Procedure: IVC filter placement  Findings: Patent IVC.  [place] filter placed below renal veins  Complications: None     EBL: less than 10 ml  Plan: Return to inpatient room.  Continue heparin.     Luanne Krzyzanowski R. Anselm Pancoast, MD  Pager: [phone]

## 2019-05-08 NOTE — Progress Notes (Addendum)
Patient taken on monitor and heparin to IR by SWOT RNs.  Patient A&Ox4 with no pain or signs of distress upon departure.

## 2019-05-08 NOTE — Progress Notes (Signed)
ANTICOAGULATION CONSULT NOTE  Pharmacy Consult for heparin Indication: pulmonary embolus  Heparin Dosing Weight: 88 kg  Vital Signs: Temp: 97.8 F (36.6 C) (01/07 0000) Temp Source: Oral (01/07 0000) BP: 132/71 (01/07 0000) Pulse Rate: 74 (01/06 1515)  Labs: Recent Labs    05/07/19 1223 05/07/19 2247  HGB 14.8  --   HCT 43.1  --   PLT 153  --   APTT  --  98*  CREATININE 0.83  --   TROPONINIHS 16  --     Assessment: Pt is on rivaroxaban prior to admission for PE. He just started the rivaroxaban recently as he was still receiving the loading dose. His last dose of Xarelto was this morning. CTA today shows a saddle and bilateral PE. Risks vs benefits assessed of starting heparin now or waiting until this evening. Given extensive nature of PE, decision was made to start now. RV/LV ratio < 0.9, CBC and renal fx, stable. May be candidate for EKOS but currently delayed due to Xarelto doses.  Heparin level likely falsely affected by Xarelto so utilizing PTT for monitoring. PTT = 98 sec (therapeutic) on gtt at 1400 units/hr. No bleeding noted.   Goal of Therapy:  PTT 66-102 sec Heparin level 0.3-0.7 units/ml Monitor platelets by anticoagulation protocol: Yes    Plan:  -Continue heparin at 1400 units/hr -Daily HL, CBC, aPTT  Sherlon Handing, PharmD, BCPS Please see amion for complete clinical pharmacist phone list 05/08/2019,12:38 AM

## 2019-05-08 NOTE — Progress Notes (Signed)
ANTICOAGULATION CONSULT NOTE  Pharmacy Consult for heparin Indication: pulmonary embolus  Heparin Dosing Weight: 88 kg  Vital Signs: Temp: 97.6 F (36.4 C) (01/07 0400) Temp Source: Axillary (01/07 0400) BP: 142/92 (01/07 0500)  Labs: Recent Labs    05/07/19 1223 05/07/19 2247 05/08/19 0721  HGB 14.8  --  14.5  HCT 43.1  --  43.0  PLT 153  --  158  APTT  --  98* 84*  CREATININE 0.83  --  0.79  TROPONINIHS 16  --   --     Assessment: 9 yoM admitted for bilateral saddle PE, no RV strain and hemodynamically stable. Pt was started on Xarelto outpatient by PCP, last dose on 1/6 AM. Pharmacy consulted to dose heparin. Plan for EKOS this AM 24 hr after last Xarelto dose.  Monitoring with aPTT since heparin level will be falsely elevated by recent Xarelto dose. aPTT is therapeutic at 84 sec. CBC stable, no bleeding noted.  Goal of Therapy:  PTT 66-102 sec Heparin level 0.3-0.7 units/ml Monitor platelets by anticoagulation protocol: Yes   Plan:  -Continue heparin at 1400 units/hr -F/u EKOS plan -Daily HL, CBC, aPTT  Berenice Bouton, PharmD PGY1 Pharmacy Resident  Please check AMION for all [place] phone numbers After 10:00 PM, call [place] [phone] 05/08/2019,7:58 AM

## 2019-05-08 NOTE — Progress Notes (Addendum)
Progress Note  Patient Name: James Noble Date of Encounter: 05/08/2019  Primary Cardiologist: Candee Furbish, MD   Subjective   Resting comfortably in bed.  No chest pain.  [birth year].  Had been developing progressively worsened shortness of breath with activity.  Inpatient Medications    Scheduled Meds: . amLODipine  10 mg Oral Daily  . Chlorhexidine Gluconate Cloth  6 each Topical Daily  . Chlorhexidine Gluconate Cloth  6 each Topical Daily  . losartan  100 mg Oral Daily  . rosuvastatin  20 mg Oral QHS   Continuous Infusions: . heparin 1,400 Units/hr (05/08/19 0800)   PRN Meds: acetaminophen, ondansetron (ZOFRAN) IV   Vital Signs    Vitals:   05/08/19 0500 05/08/19 0700 05/08/19 0800 05/08/19 0811  BP: (!) 142/92 (!) 111/96 131/71   Pulse:   67   Resp: 17 13 15    Temp:    98.1 F (36.7 C)  TempSrc:    Oral  SpO2: 93% 96% 94%   Weight:      Height:        Intake/Output Summary (Last 24 hours) at 05/08/2019 0911 Last data filed at 05/08/2019 [id number] Gross per 24 hour  Intake 278.56 ml  Output 700 ml  Net -421.44 ml   Last 3 Weights 05/07/2019 12/10/2017 10/11/2017  Weight (lbs) 196 lb 185 lb 185 lb  Weight (kg) 88.905 kg 83.915 kg 83.915 kg      Telemetry    Sinus rhythm with occasional PVC- Personally Reviewed  ECG    RBBB- Personally Reviewed  Physical Exam   GEN: No acute distress.   Neck: No JVD Cardiac: RRR, no murmurs, rubs, or gallops.  Respiratory: Clear to auscultation bilaterally. GI: Soft, nontender, non-distended  MS: No edema; No deformity. Neuro:  Nonfocal  Psych: Normal affect   Labs    High Sensitivity Troponin:   Recent Labs  Lab 05/07/19 1223  TROPONINIHS 16      Chemistry Recent Labs  Lab 05/07/19 1223 05/08/19 0721  NA 138 140  K 4.3 4.1  CL 108 106  CO2 20* 26  GLUCOSE 108* 104*  BUN 11 8  CREATININE 0.83 0.79  CALCIUM 8.8* 9.3  GFRNONAA >60 >60  GFRAA >60 >60  ANIONGAP 10 8     Hematology Recent  Labs  Lab 05/07/19 1223 05/08/19 0721  WBC 6.3 5.6  RBC 4.22 4.16*  HGB 14.8 14.5  HCT 43.1 43.0  MCV 102.1* 103.4*  MCH 35.1* 34.9*  MCHC 34.3 33.7  RDW 13.3 13.2  PLT 153 158    BNP Recent Labs  Lab 05/07/19 1223  BNP 91.8     DDimer No results for input(s): DDIMER in the last 168 hours.   Radiology    DG Chest 2 View  Result Date: 05/06/2019 CLINICAL DATA:  Dyspnea on exertion for several weeks EXAM: CHEST - 2 VIEW COMPARISON:  03/12/2014 FINDINGS: The heart size and mediastinal contours are within normal limits. Both lungs are clear. The visualized skeletal structures are unremarkable. IMPRESSION: No active cardiopulmonary disease. Electronically Signed   By: Inez Catalina M.D.   On: 05/06/2019 15:23   CT ANGIO CHEST PE W OR WO CONTRAST  Result Date: 05/07/2019 CLINICAL DATA:  Shortness of breath EXAM: CT ANGIOGRAPHY CHEST WITH CONTRAST TECHNIQUE: Multidetector CT imaging of the chest was performed using the standard protocol during bolus administration of intravenous contrast. Multiplanar CT image reconstructions and MIPs were obtained to evaluate the vascular anatomy.  CONTRAST:  68mL ISOVUE-370 IOPAMIDOL (ISOVUE-370) INJECTION 76% COMPARISON:  Chest radiograph May 06, 2019 FINDINGS: Cardiovascular: There is extensive pulmonary embolus, arising in both main pulmonary arteries in a saddle type distribution with extension of pulmonary emboli into multiple upper and lower lobe pulmonary artery branches bilaterally. The right ventricle to left ventricle diameter ratio is less than 0.9, not indicative of right heart strain. There is no thoracic aortic aneurysm or dissection. Visualized great vessels demonstrate occasional mild foci of calcification. Note that the right innominate and left common carotid arteries arise as a common trunk, an anatomic variant. There is aortic atherosclerosis as well as foci of coronary artery calcification. There is no pericardial effusion or pericardial  thickening. Mediastinum/Nodes: Thyroid appears unremarkable. There is no appreciable thoracic adenopathy by size criteria. There are occasional subcentimeter mediastinal lymph nodes which do not meet size criteria for pathologic significance. There is a small hiatal hernia. Lungs/Pleura: There are scattered areas of atelectatic change. No evident consolidation or pulmonary infarct. No pleural effusions are evident. Upper Abdomen: There is upper abdominal aortic atherosclerosis. Visualized upper abdominal structures otherwise appear unremarkable. Musculoskeletal: There are foci of degenerative change in the thoracic spine. No blastic or lytic bone lesions are evident. There are no chest wall lesions. Review of the MIP images confirms the above findings. IMPRESSION: 1. Extensive bilateral pulmonary emboli. Pulmonary emboli arise from both main pulmonary outflow tracks in a saddle type distribution. Right ventricle to left ventricular diameter is less than 0.9, not indicative of right heart strain. 2. No thoracic aortic aneurysm or dissection. There is aortic atherosclerosis as well as foci of coronary artery calcification. 3. Areas of scattered atelectasis. No edema or consolidation. No evident pulmonary infarct. 4.  Small hiatal hernia. 5.  No adenopathy. Critical Value/emergent results were called by telephone at the time of interpretation on 05/07/2019 at 11:03 am to providerJOHN GRIFFIN , who verbally acknowledged these results. Aortic Atherosclerosis (ICD10-I70.0). Electronically Signed   By: Lowella Grip III M.D.   On: 05/07/2019 11:03   ECHOCARDIOGRAM COMPLETE  Result Date: 05/07/2019   ECHOCARDIOGRAM REPORT   Patient Name:   James Noble Date of Exam: 05/07/2019 Medical Rec #:  [id number]     Height:       71.0 in Accession #:    [id number]    Weight:       185.0 lb Date of Birth:  10/06/1940      BSA:          2.04 m Patient Age:    79 years      BP:           146/83 mmHg Patient Gender: M             HR:            75 bpm. Exam Location:  Inpatient Procedure: 2D Echo, Cardiac Doppler and Color Doppler Indications:    R06.02 SOB; I26.02 Pulmonary embolus  History:        Patient has no prior history of Echocardiogram examinations.                 Arrythmias:RBBB, Signs/Symptoms:Dyspnea; Risk                 Factors:Hypertension and Dyslipidemia.  Sonographer:    Roseanna Rainbow RDCS Referring Phys: [id number] Kimon Loewen C Maral Lampe  Sonographer Comments: Suboptimal subcostal window. IMPRESSIONS  1. Left ventricular ejection fraction, by visual estimation, is 55 to 60%. The left ventricle has normal function. There  is moderately increased left ventricular hypertrophy.  2. Moderate asymmetric hypertrophy measuring 69mm in basal septum (83mm in posterior wall)  3. Global right ventricle has normal systolic function.The right ventricular size is mildly enlarged.  4. The mitral valve is normal in structure. No evidence of mitral valve regurgitation.  5. The aortic valve is tricuspid. Aortic valve regurgitation is trivial. Mild aortic valve sclerosis without stenosis.  6. The tricuspid valve is normal in structure.  7. The pulmonic valve was not well visualized. Pulmonic valve regurgitation is trivial.  8. Left atrial size was normal.  9. Right atrial size was normal. 10. The inferior vena cava is normal in size with greater than 50% respiratory variability, suggesting right atrial pressure of 3 mmHg. 11. TR signal is inadequate for assessing pulmonary artery systolic pressure. 12. Thrombus seen at the bifurcation of the pulmonary arteries. FINDINGS  Left Ventricle: Left ventricular ejection fraction, by visual estimation, is 55 to 60%. The left ventricle has normal function. The left ventricle has no regional wall motion abnormalities. The left ventricular internal cavity size was the left ventricle is normal in size. There is moderately increased left ventricular hypertrophy. Asymmetric left ventricular hypertrophy. Left ventricular diastolic  parameters were normal. Right Ventricle: The right ventricular size is mildly enlarged. No increase in right ventricular wall thickness. Global RV systolic function is has normal systolic function. Left Atrium: Left atrial size was normal in size. Right Atrium: Right atrial size was normal in size Pericardium: Trivial pericardial effusion is present. Mitral Valve: The mitral valve is normal in structure. No evidence of mitral valve regurgitation. Tricuspid Valve: The tricuspid valve is normal in structure. Tricuspid valve regurgitation is trivial. Aortic Valve: The aortic valve is tricuspid. Aortic valve regurgitation is trivial. Mild aortic valve sclerosis is present, with no evidence of aortic valve stenosis. Pulmonic Valve: The pulmonic valve was not well visualized. Pulmonic valve regurgitation is trivial. Pulmonic regurgitation is trivial. Aorta: The aortic root is normal in size and structure. Pulmonary Artery: Suspect thromboembolism in the main pulmonary artery. Venous: The inferior vena cava is normal in size with greater than 50% respiratory variability, suggesting right atrial pressure of 3 mmHg. IAS/Shunts: The interatrial septum was not well visualized.  LEFT VENTRICLE PLAX 2D LVIDd:         4.00 cm       Diastology LVIDs:         3.20 cm       LV e' lateral:   5.33 cm/s LV PW:         1.20 cm       LV E/e' lateral: 8.6 LV IVS:        1.70 cm       LV e' medial:    5.00 cm/s LVOT diam:     2.00 cm       LV E/e' medial:  9.2 LV SV:         29 ml LV SV Index:   14.10 LVOT Area:     3.14 cm  LV Volumes (MOD) LV area d, A2C:    31.90 cm LV area d, A4C:    31.00 cm LV area s, A2C:    18.20 cm LV area s, A4C:    18.60 cm LV major d, A2C:   8.94 cm LV major d, A4C:   8.71 cm LV major s, A2C:   7.14 cm LV major s, A4C:   7.54 cm LV vol d, MOD A2C: 92.8 ml LV vol  d, MOD A4C: 89.7 ml LV vol s, MOD A2C: 38.1 ml LV vol s, MOD A4C: 38.5 ml LV SV MOD A2C:     54.7 ml LV SV MOD A4C:     89.7 ml LV SV MOD BP:       53.1 ml RIGHT VENTRICLE             IVC RV S prime:     15.00 cm/s  IVC diam: 1.30 cm TAPSE (M-mode): 2.7 cm LEFT ATRIUM             Index       RIGHT ATRIUM           Index LA diam:        3.70 cm 1.81 cm/m  RA Area:     18.30 cm LA Vol (A2C):   42.2 ml 20.69 ml/m RA Volume:   51.90 ml  25.44 ml/m LA Vol (A4C):   37.7 ml 18.48 ml/m LA Biplane Vol: 41.8 ml 20.49 ml/m  AORTIC VALVE             PULMONIC VALVE LVOT Vmax:   107.00 cm/s PR End Diast Vel: 2.09 msec LVOT Vmean:  71.400 cm/s LVOT VTI:    0.206 m  AORTA Ao Root diam: 3.55 cm MITRAL VALVE MV Area (PHT): 2.62 cm             SHUNTS MV PHT:        83.81 msec           Systemic VTI:  0.21 m MV Decel Time: 289 msec             Systemic Diam: 2.00 cm MV E velocity: 46.00 cm/s 103 cm/s MV A velocity: 78.30 cm/s 70.3 cm/s MV E/A ratio:  0.59       1.5  Oswaldo Milian MD Electronically signed by Oswaldo Milian MD Signature Date/Time: 05/07/2019/9:38:08 PM    Final     Cardiac Studies   Echocardiogram-mildly dilated RV, normal EF  Patient Profile     79 y.o. male with massive acute saddle pulmonary embolus, hemodynamically stable with mildly dilated RV  Assessment & Plan    Acute saddle pulmonary embolus -Appreciate critical care and interventional radiology team seeing him yesterday for possible catheter directed TPA administration given his large clot burden.  Right ventricle is mildly dilated on echocardiogram. -Currently on IV heparin. -Last Xarelto dose yesterday morning 1/6 (took 2 total doses evening of 1/5 and morning of 1/6) -Lab work reviewed unremarkable.  Essential hypertension -On medications as above.  Stable.  Hyperlipidemia -Crestor.  Spoke to family.  For questions or updates, please contact [facility] Please consult [email or web address] for contact info under        Signed, Candee Furbish, MD  05/08/2019, 9:11 AM

## 2019-05-08 NOTE — Progress Notes (Signed)
Patient updated on plan of care.  Spoke with [facility] resident who states IR team is coming to discuss options with patient. Patient remains NPO and on IV heparin.  Patient understands plan at this time.

## 2019-05-08 NOTE — Progress Notes (Signed)
NAME:  James Noble, MRN:  [id number], DOB:  01/02/1941, LOS: 1 ADMISSION DATE:  05/07/2019, CONSULTATION DATE:  05/07/2018 REFERRING MD:  Candee Furbish MD CHIEF COMPLAINT:  Dyspnea/PE   Brief History   James Noble is a 79 yr old male with one month of progressive dyspnea found to have acute saddle pulmonary embolus.   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 Patient usually walks 2-3 miles per day with his wife but has noted increasing dyspnea recently. He did notice left lower extremity edema two weeks ago. Approximately one week ago, patient noted increased fatigue and was unable to complete his normal exercise routine at the gym and stopped going. Patient denies any associated chest pain, hemoptysis, sedentary status, recent surgeries, recent travel, or prior history of DVT/PE.   Past Medical History  Hypertension Hyperlipidemia Lumbar spinal stenosis w/sciatica Basal cell carcinoma s/p Mohs procedure in 2014 Hyperuricemia w/o inflammatory arthritis/tophaceous disease  Significant Hospital Events   1/6 > admission for acute saddle embolus  Consults:  Cardiology IR  Procedures:  None  Significant Diagnostic Tests:  01/06: CTA Chest > Extensive bilateral pulmonary emboli. Pulmonary emboli arise from both main pulmonary outflow tracks in a saddle type distribution. Right ventricle to left ventricular diameter is less than 0.9, not indicative of right heart strain.  No thoracic aortic aneurysm or dissection. There is aortic atherosclerosis as well as foci of coronary artery calcification. Areas of scattered atelectasis. No edema or consolidation. No evident pulmonary infarct.  01/06: Echo > LVEF 55-60%, moderate LVH; moderate assymetric  hypertrophy (47mm in basal septum, 23mm in posterior wall); RV size mildly enlarged. No significant valvular abnormalities. Thrombus seen at bifurcation of pulmonary arteries   01/06: DVT US >   Micro Data:  1/6 SARS-CoV2 > Negative   Antimicrobials:  None  Interim history/subjective:  Patient admitted yesterday with progressive dyspnea on exertion for one month duration and two week duration of left lower extremity swelling. CTA Chest with extensive acute saddle embolism. Patient is in no acute distress and hemodynamically stable. He is resting comfortably in bed and denies any chest pain or dyspnea. He has no acute concerns at this time.   Objective   Blood pressure (!) 142/92, pulse 74, temperature 97.6 F (36.4 C), temperature source Axillary, resp. rate 17, height 5\' 11"  (1.803 m), weight 88.9 kg, SpO2 93 %.        Intake/Output Summary (Last 24 hours) at 05/08/2019 0657 Last data filed at 05/08/2019 0600 Gross per 24 hour  Intake 250.67 ml  Output 400 ml  Net -149.33 ml   Filed Weights   05/07/19 1500  Weight: 88.9 kg    Examination: Physical Exam  Constitutional: He is oriented to person, place, and time and well-developed, well-nourished, and in no distress.  Cardiovascular: Normal rate, regular rhythm, normal heart sounds and intact distal pulses.  Pulmonary/Chest: Effort normal and breath sounds normal.  Abdominal: Soft. Bowel sounds are normal. He exhibits no distension. There is no abdominal tenderness.  Musculoskeletal:        General: No tenderness or edema. Normal range of motion.  Neurological: He is alert and oriented to person, place, and time.  Skin: Skin is warm and dry. No rash noted. He is not diaphoretic.  Resolved Hospital Problem list    Assessment & Plan:  79 year old male with PMHx of hypertension and hyperlipidemia presenting with one month history of progressive dyspnea on exertion and two weeks of left lower extremity edema. He is found to have  an extensive saddle pulmonary embolus on CTA Chest.    Acute unprovoked saddle pulmonary embolism:  - Progressive dyspnea on exertion x1 month  - CTA Chest with extensive bilateral pulmonary emboli arising from both main pulmonary outflow tracts in saddle type distribution w/o right heart strain.  - Echo w/LVEF 55-60%, moderate LVH, asymmetric hypertrophy in basal septum; normal RV systolic function w/mildly enlarged RV size - No known factors  - Patient is hemodynamically stable and is saturating 95% on room air. Normotensive and HR 70s. Asymptomatic  P:  Continue IV heparin and close observation Per conversation with Dr. Laurence Ferrari, patient does not require immediate EKOS and unclear whether this would be beneficial given chronicity of the embolism; May benefit more from [place] but will need further discussion with the patient  Patient does not require ICU level care at this point as he is hemodynamically stable and asymptomatic. PCCM available as needed.   Left lower extremity edema presumably DVT: - Two weeks of left ankle swelling and mild left lower extremity swelling - Nontender to palpation, distal pulses palpable.  P:   f/u LLE vascular US   Best practice:  Diet: NPO Pain/Anxiety/Delirium protocol (if indicated): none VAP protocol (if indicated): none DVT prophylaxis: Full VTE dose for acute PE GI prophylaxis: None Glucose control: N/A Mobility: OOB as tolerated  Code Status: FULL Family Communication: will update family  Disposition: per primary team   Labs   CBC: Recent Labs  Lab 05/07/19 1223  WBC 6.3  NEUTROABS 4.3  HGB 14.8  HCT 43.1  MCV 102.1*  PLT 0000000    Basic Metabolic Panel: Recent Labs  Lab 05/07/19 1223  NA 138  K 4.3  CL 108  CO2 20*  GLUCOSE 108*  BUN 11  CREATININE 0.83  CALCIUM 8.8*   GFR: Estimated Creatinine Clearance: 79.4 mL/min (by C-G formula based on SCr of 0.83 mg/dL). Recent Labs  Lab 05/07/19 1223  WBC 6.3     Liver Function Tests: No results for input(s): AST, ALT, ALKPHOS, BILITOT, PROT, ALBUMIN in the last 168 hours. No results for input(s): LIPASE, AMYLASE in the last 168 hours. No results for input(s): AMMONIA in the last 168 hours.  ABG No results found for: PHART, PCO2ART, PO2ART, HCO3, TCO2, ACIDBASEDEF, O2SAT   Coagulation Profile: No results for input(s): INR, PROTIME in the last 168 hours.  Cardiac Enzymes: No results for input(s): CKTOTAL, CKMB, CKMBINDEX, TROPONINI in the last 168 hours.  HbA1C: Hgb A1c MFr Bld  Date/Time Value Ref Range Status  10/14/2015 08:23 AM 5.2 4.6 - 6.5 % Final    Comment:    Glycemic Control Guidelines for People with Diabetes:Non Diabetic:  <6%Goal of Therapy: <7%Additional Action Suggested:  >8%   03/13/2014 11:57 AM 5.7 4.6 - 6.5 % Final    Comment:    Glycemic Control Guidelines for People with Diabetes:Non Diabetic:  <6%Goal of Therapy: <7%Additional Action Suggested:  >8%     CBG: No results for input(s): GLUCAP in the last 168 hours.  Review of Systems:   All systems reviewed and negative except as stated in HPI and subjective/intermitent history.   Past Medical History  He,  has a past medical history of Cancer ([place]), Hiatal hernia, colonic  polyps, Hyperlipidemia, Hyperuricemia without signs inflammatory arthritis/tophaceous disease (2012), Sciatica, and Spinal stenosis.   Surgical History    Past Surgical History:  Procedure Laterality Date  . colonoscopy with polypectomy  2005 & 2008   Dr Earlean Shawl  . [place]  . INGUINAL HERNIA REPAIR  1968  . steroid spinal injections  2013   X2, Dr Ernestina Patches  . TONSILLECTOMY AND ADENOIDECTOMY  1949  . UPPER GASTROINTESTINAL ENDOSCOPY  2008   Dr Earlean Shawl; Hiatal hernia  . VASECTOMY       Social History   reports that he quit smoking about 51 years ago. He has never used smokeless tobacco. He reports current alcohol use of about 14.0 standard drinks of alcohol per week. He  reports that he does not use drugs.   Family History   His family history includes Heart attack (age of onset: 14) in his father; Stroke in his maternal grandfather, maternal grandmother, and maternal uncle; Stroke (age of onset: 25) in his mother. There is no history of Diabetes.   Allergies Allergies  Allergen Reactions  . Dexlansoprazole Nausea And Vomiting  . Nexium [Esomeprazole Magnesium]     Nausea & vomiting     Home Medications  Prior to Admission medications   Medication Sig Start Date End Date Taking? Authorizing Provider  amLODipine (NORVASC) 5 MG tablet Take 2 tablets (10 mg total) by mouth daily. Overdue for annual appt w/labs must see provider for refills 11/14/16  Yes Burns, Claudina Lick, MD  Apoaequorin (PREVAGEN PO) Take 1 capsule by mouth daily.   Yes [provider]  bismuth subsalicylate (PEPTO BISMOL) 262 MG chewable tablet Chew 524 mg by mouth as needed for indigestion.   Yes [provider]  Ergocalciferol (VITAMIN D2) 400 units TABS Take 1 tablet by mouth daily.   Yes [provider]  fluticasone (FLONASE) 50 MCG/ACT nasal spray Place 2 sprays into both nostrils at bedtime.   Yes [provider]  guaiFENesin (MUCINEX) 600 MG 12 hr tablet Take 600 mg by mouth daily.   Yes [provider]  guaiFENesin (ROBITUSSIN) 100 MG/5ML liquid Take 100 mg by mouth at bedtime.   Yes [provider]  losartan (COZAAR) 100 MG tablet Take 1 tablet (100 mg total) by mouth daily. Must keep August appt for future refills 11/07/16  Yes Burns, Claudina Lick, MD  Multiple Vitamin (MULTI-VITAMINS) TABS Take 1 tablet by mouth daily.   Yes [provider]  Probiotic Product (PROBIOTIC ACIDOPHILUS BEADS PO) Take 1 capsule by mouth daily.   Yes [provider]  Rivaroxaban (XARELTO) 15 MG TABS tablet Take 15 mg by mouth 2 (two) times daily with a meal.   Yes [provider]  rosuvastatin (CRESTOR) 20 MG tablet TAKE ONE TABLET  AT BEDTIME Patient taking differently: Take 20 mg by mouth at bedtime.  09/04/16  Yes Burns, Claudina Lick, MD  vitamin B-12 (CYANOCOBALAMIN) 1000 MCG tablet Take 1,000 mcg by mouth daily.   Yes [provider]  cyclobenzaprine (FLEXERIL) 5 MG tablet Take 1 tablet (5 mg total) by mouth 2 (two) times daily as needed for muscle spasms (may cause drowsiness). Patient not taking: Reported on 12/10/2017 04/28/16   Nche, Charlene Brooke, NP  gabapentin (NEURONTIN) 300 MG capsule Take 1 capsule (300 mg total) by mouth at bedtime. 07/15/18   Thurman Coyer, DO  naproxen (NAPROSYN) 500 MG tablet Take 1 tablet (500 mg total) by mouth 2 (two) times daily as needed. Take with  food. Patient not taking: Reported on 05/07/2019 06/17/18   Thurman Coyer, DO     Critical care time:     CRITICAL CARE Performed by: Harvie Heck Internal Medicine, PGY-1 05/08/2019 7:51 AM  Total critical care time: 30 minutes  Critical care was necessary to treat or prevent imminent or life-threatening deterioration. Critical care was time spent personally by me on the following activities: development of treatment plan with patient and/or surrogate as well as nursing, discussions with consultants, evaluation of patient's response to treatment, examination of patient, obtaining history from patient or surrogate, ordering and performing treatments and interventions, ordering and review of laboratory studies, ordering and review of radiographic studies, pulse oximetry and re-evaluation of patient's condition.

## 2019-05-09 LAB — CBC
HCT: 41.4 % (ref 39.0–52.0)
Hemoglobin: 14.3 g/dL (ref 13.0–17.0)
MCH: 35.4 pg — ABNORMAL HIGH (ref 26.0–34.0)
MCHC: 34.5 g/dL (ref 30.0–36.0)
MCV: 102.5 fL — ABNORMAL HIGH (ref 80.0–100.0)
Platelets: 143 10*3/uL — ABNORMAL LOW (ref 150–400)
RBC: 4.04 MIL/uL — ABNORMAL LOW (ref 4.22–5.81)
RDW: 13.3 % (ref 11.5–15.5)
WBC: 5.6 10*3/uL (ref 4.0–10.5)
nRBC: 0 % (ref 0.0–0.2)

## 2019-05-09 LAB — APTT: aPTT: 92 seconds — ABNORMAL HIGH (ref 24–36)

## 2019-05-09 LAB — HEPARIN LEVEL (UNFRACTIONATED): Heparin Unfractionated: 1.07 IU/mL — ABNORMAL HIGH (ref 0.30–0.70)

## 2019-05-09 MED ORDER — RIVAROXABAN 15 MG PO TABS
15.0000 mg | ORAL_TABLET | Freq: Two times a day (BID) | ORAL | Status: DC
  Administered 2019-05-09 – 2019-05-10 (×3): 15 mg via ORAL
  Filled 2019-05-09 (×4): qty 1

## 2019-05-09 NOTE — Care Management (Signed)
Per [employer] Co-pay amount for Xeralto (rivarovoxaban ) same price as the brand name, (after the  $445.00) deductible has been met co-pay will be $47.00 - $45.00  for the 39m 60 for 30 days supply and the 264m 30 days supply.  No PA required Deductible not met ($445.00) Tier 3 medication Retail Pharmacy : [facility]. [facility].  Mail order for a 90 day supply $580.00.  Ref# [id number]

## 2019-05-09 NOTE — Discharge Instructions (Signed)
Information on my medicine - XARELTO (rivaroxaban)  This medication education was reviewed with me or my healthcare representative as part of my discharge preparation. A pharmacist spoke with me about this medication.  WHY WAS XARELTO PRESCRIBED FOR YOU? Xarelto was prescribed to treat blood clots that may have been found in the veins of your legs (deep vein thrombosis) or in your lungs (pulmonary embolism) and to reduce the risk of them occurring again.  What do you need to know about Xarelto? The starting dose is one 15 mg tablet taken TWICE daily with food for the FIRST 21 DAYS then on (enter date)  05/30/2019  the dose is changed to one 20 mg tablet taken ONCE A DAY with your evening meal.  DO NOT stop taking Xarelto without talking to the health care provider who prescribed the medication.  Refill your prescription for 20 mg tablets before you run out.  After discharge, you should have regular check-up appointments with your healthcare provider that is prescribing your Xarelto.  In the future your dose may need to be changed if your kidney function changes by a significant amount.  What do you do if you miss a dose? If you are taking Xarelto TWICE DAILY and you miss a dose, take it as soon as you remember. You may take two 15 mg tablets (total 30 mg) at the same time then resume your regularly scheduled 15 mg twice daily the next day.  If you are taking Xarelto ONCE DAILY and you miss a dose, take it as soon as you remember on the same day then continue your regularly scheduled once daily regimen the next day. Do not take two doses of Xarelto at the same time.   Important Safety Information Xarelto is a blood thinner medicine that can cause bleeding. You should call your healthcare provider right away if you experience any of the following: ? Bleeding from an injury or your nose that does not stop. ? Unusual colored urine (red or dark brown) or unusual colored stools (red or  black). ? Unusual bruising for unknown reasons. ? A serious fall or if you hit your head (even if there is no bleeding).  Some medicines may interact with Xarelto and might increase your risk of bleeding while on Xarelto. To help avoid this, consult your healthcare provider or pharmacist prior to using any new prescription or non-prescription medications, including herbals, vitamins, non-steroidal anti-inflammatory drugs (NSAIDs) and supplements.  This website has more information on Xarelto: [email or web address]

## 2019-05-09 NOTE — Progress Notes (Signed)
NAME:  James Noble, MRN:  [id number], DOB:  11-22-1940, LOS: 2 ADMISSION DATE:  05/07/2019, CONSULTATION DATE:  05/07/2018 REFERRING MD:  Candee Furbish MD CHIEF COMPLAINT:  Dyspnea/PE   Brief History   James Noble is a 79 yr old male with one month of progressive dyspnea found to have acute saddle pulmonary embolus.   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 Patient usually walks 2-3 miles per day with his wife but has noted increasing dyspnea recently. He did notice left lower extremity edema two weeks ago. Approximately one week ago, patient noted increased fatigue and was unable to complete his normal exercise routine at the gym and stopped going. Patient denies any associated chest pain, hemoptysis, sedentary status, recent surgeries, recent travel, or prior history of DVT/PE.   Past Medical History  Hypertension Hyperlipidemia Lumbar spinal stenosis w/sciatica Basal cell carcinoma s/p Mohs procedure in 2014 Hyperuricemia w/o inflammatory arthritis/tophaceous disease  Significant Hospital Events   1/6 > admission for acute saddle embolus  Consults:  Cardiology IR  Procedures:  None  Significant Diagnostic Tests:  01/06: CTA Chest > Extensive bilateral pulmonary emboli. Pulmonary emboli arise from both main pulmonary outflow tracks in a saddle type distribution. Right ventricle to left ventricular diameter is less than 0.9, not indicative of right heart strain.  No thoracic aortic aneurysm or dissection. There is aortic atherosclerosis as well as foci of coronary artery calcification. Areas of scattered atelectasis. No edema or consolidation. No evident pulmonary infarct.  01/06: Echo > LVEF 55-60%, moderate LVH; moderate assymetric  hypertrophy (56mm in basal septum, 57mm in posterior wall); RV size mildly enlarged. No significant valvular abnormalities. Thrombus seen at bifurcation of pulmonary arteries   01/06: DVT US >   Micro Data:  1/6 SARS-CoV2 > Negative   Antimicrobials:  None  Interim history/subjective:  Currently on room air.  Being treated for PE secondary to left DVT hemodynamically stable on heparin drip pulmonary critical care will sign off.  Objective   Blood pressure 115/78, pulse 78, temperature 98.4 F (36.9 C), temperature source Oral, resp. rate 17, height 5\' 11"  (1.803 m), weight 88.9 kg, SpO2 94 %.        Intake/Output Summary (Last 24 hours) at 05/09/2019 0926 Last data filed at 05/09/2019 0700 Gross per 24 hour  Intake 402.14 ml  Output 450 ml  Net -47.86 ml   Filed Weights   05/07/19 1500  Weight: 88.9 kg    Examination: General: Well-nourished well-developed male no acute distress HEENT: MM pink/moist no JVD or lymphadenopathy is appreciated Neuro: Grossly intact no focal defect CV: Heart sounds regular rate rhythm PULM: Diminished in the bases GI: soft, bsx4 active  Extremities: warm/dry,  edema left lower extremity warm and dry reported swelling is diminished Skin: no rashes or lesions   Resolved Hospital Problem list    Assessment & Plan:  79 year old male with PMHx of hypertension and hyperlipidemia presenting with one month history of progressive dyspnea on exertion and two weeks of left lower extremity edema. He is found to have an extensive saddle pulmonary embolus on CTA Chest.    Acute unprovoked saddle pulmonary embolism:  - Progressive dyspnea on exertion x1 month  - CTA Chest with extensive bilateral pulmonary emboli arising  from both main pulmonary outflow tracts in saddle type distribution w/o right heart strain.  - Echo w/LVEF 55-60%, moderate LVH, asymmetric hypertrophy in basal septum; normal RV systolic function w/mildly enlarged RV size - No known  factors  - Patient is hemodynamically stable and is saturating 95% on room air. Normotensive and HR 70s. Asymptomatic  P:  We will need at least 6 months of anticoagulation.  Left lower extremity edema presumably DVT: - Two weeks of left ankle swelling and mild left lower extremity swelling - Nontender to palpation, distal pulses palpable.  P:  05/07/2018 lower extremity Doppler studies with left DVT Continue heparin We will need at least 6 months of anticoagulation  Best practice:  Diet: Advance as tolerated per primary Pain/Anxiety/Delirium protocol (if indicated): none VAP protocol (if indicated): none DVT prophylaxis: Full VTE dose for acute PE GI prophylaxis: None Glucose control: N/A Mobility: OOB as tolerated  Code Status: FULL Family Communication: 05/09/2019 patient updated at bedside Disposition: per primary team, 05/09/2019 pulmonary critical care will sign off at this time.  Labs   CBC: Recent Labs  Lab 05/07/19 1223 05/08/19 0721 05/09/19 0229  WBC 6.3 5.6 5.6  NEUTROABS 4.3  --   --   HGB 14.8 14.5 14.3  HCT 43.1 43.0 41.4  MCV 102.1* 103.4* 102.5*  PLT 153 158 143*    Basic Metabolic Panel: Recent Labs  Lab 05/07/19 1223 05/08/19 0721  NA 138 140  K 4.3 4.1  CL 108 106  CO2 20* 26  GLUCOSE 108* 104*  BUN 11 8  CREATININE 0.83 0.79  CALCIUM 8.8* 9.3   GFR: Estimated Creatinine Clearance: 82.4 mL/min (by C-G formula based on SCr of 0.79 mg/dL). Recent Labs  Lab 05/07/19 1223 05/08/19 0721 05/09/19 0229  WBC 6.3 5.6 5.6    Liver Function Tests: No results for input(s): AST, ALT, ALKPHOS, BILITOT, PROT, ALBUMIN in the last 168 hours. No results for input(s): LIPASE, AMYLASE in the last 168 hours. No results for input(s): AMMONIA in the last 168 hours.  ABG No results found for: PHART, PCO2ART, PO2ART, HCO3, TCO2, ACIDBASEDEF, O2SAT   Coagulation Profile: No results for input(s): INR, PROTIME in the last 168 hours.  Cardiac Enzymes: No  results for input(s): CKTOTAL, CKMB, CKMBINDEX, TROPONINI in the last 168 hours.  HbA1C: Hgb A1c MFr Bld  Date/Time Value Ref Range Status  10/14/2015 08:23 AM 5.2 4.6 - 6.5 % Final    Comment:    Glycemic Control Guidelines for People with Diabetes:Non Diabetic:  <6%Goal of Therapy: <7%Additional Action Suggested:  >8%   03/13/2014 11:57 AM 5.7 4.6 - 6.5 % Final    Comment:    Glycemic Control Guidelines for People with Diabetes:Non Diabetic:  <6%Goal of Therapy: <7%Additional Action Suggested:  >8%     CBG: No results for input(s): GLUCAP in the last 168 hours.  App cct 30 min Richardson Landry Bayan Hedstrom ACNP Acute Care Nurse Practitioner [place] Please consult Amion 05/09/2019, 9:27 AM

## 2019-05-09 NOTE — Progress Notes (Signed)
ANTICOAGULATION CONSULT NOTE  Pharmacy Consult for heparin Indication: pulmonary embolus  Heparin Dosing Weight: 88 kg  Vital Signs: Temp: 98.1 F (36.7 C) (01/08 0400) Temp Source: Oral (01/08 0400) BP: 115/78 (01/08 0600)  Labs: Recent Labs    05/07/19 1223 05/07/19 2247 05/08/19 0721 05/09/19 0229  HGB 14.8  --  14.5 14.3  HCT 43.1  --  43.0 41.4  PLT 153  --  158 143*  APTT  --  98* 84* 92*  HEPARINUNFRC  --   --  1.46* 1.07*  CREATININE 0.83  --  0.79  --   TROPONINIHS 16  --   --   --     Assessment: 34 yoM admitted for bilateral saddle PE, no RV strain and hemodynamically stable. Pt was started on Xarelto outpatient by PCP, last dose on 1/6 AM. Pharmacy consulted to dose heparin. Venous doppler showed extensive LLE DVT, IR placed IVC filter on 1/7.  Monitoring with aPTT since heparin level is falsely elevated by recent Xarelto dose. aPTT is therapeutic at 92 sec. CBC stable, no bleeding noted.  Goal of Therapy:  PTT 66-102 sec Heparin level 0.3-0.7 units/ml Monitor platelets by anticoagulation protocol: Yes   Plan:  -Continue heparin at 1400 units/hr -F/u transition back to Xarelto -Daily HL, CBC, aPTT  Berenice Bouton, PharmD PGY1 Pharmacy Resident  Please check AMION for all Short phone numbers After 10:00 PM, call [place] [phone] 05/09/2019,7:22 AM

## 2019-05-09 NOTE — Progress Notes (Signed)
[place] for Xarelto (rivaroxaban) Indication: pulmonary embolus  Heparin Dosing Weight: 88 kg  Vital Signs: Temp: 98.1 F (36.7 C) (01/08 0400) Temp Source: Oral (01/08 0400) BP: 115/78 (01/08 0600)  Labs: Recent Labs    05/07/19 1223 05/07/19 2247 05/08/19 0721 05/09/19 0229  HGB 14.8  --  14.5 14.3  HCT 43.1  --  43.0 41.4  PLT 153  --  158 143*  APTT  --  98* 84* 92*  HEPARINUNFRC  --   --  1.46* 1.07*  CREATININE 0.83  --  0.79  --   TROPONINIHS 16  --   --   --     Assessment: 78 yoM admitted for bilateral saddle PE, no RV strain and hemodynamically stable. Pt was started on Xarelto outpatient by PCP, last dose on 1/6 AM. UFH infusion started 1/6 PM. Venous doppler showed extensive LLE DVT, IR placed IVC filter on 1/7. Pharmacy consulted to resume Xarelto.  APTT therapeutic at 92, CBC stable, no bleeding noted.  Goal of Therapy:  Monitor platelets by anticoagulation protocol: Yes   Plan:  Start Xarelto 15 mg BID for 21 days with meals, start Xarelto 20 mg QD with meals on 01/29. -First dose Xarelto will be given at time heparin drip turned off. -Monitor CBC and bleeding.   Vassie Moselle Student-PharmD  Please check AMION for all [place] phone numbers After 10:00 PM, call [place] [phone] 05/09/2019,8:10 AM

## 2019-05-09 NOTE — Care Management (Signed)
Xarelto benefits check sent and pending.  Midge Minium MSN, RN, NCM-BC, ACM-RN [phone]

## 2019-05-09 NOTE — Progress Notes (Signed)
Progress Note  Patient Name: James Noble Date of Encounter: 05/09/2019  Primary Cardiologist: Candee Furbish, MD   Subjective   Feeling well, he walked a few laps around [place] without any significant shortness of breath.  He states that he feels great.  He did have 1 bout of loose stool which he says is fairly common for him especially after not eating for almost 2 days.  No need for C. difficile studies.  Inpatient Medications    Scheduled Meds: . amLODipine  10 mg Oral Daily  . Chlorhexidine Gluconate Cloth  6 each Topical Daily  . Chlorhexidine Gluconate Cloth  6 each Topical Daily  . losartan  100 mg Oral Daily  . rosuvastatin  20 mg Oral QHS   Continuous Infusions: . heparin 1,400 Units/hr (05/09/19 0700)   PRN Meds: acetaminophen, ondansetron (ZOFRAN) IV   Vital Signs    Vitals:   05/09/19 0400 05/09/19 0500 05/09/19 0600 05/09/19 0700  BP:  (!) 144/80 115/78   Pulse:      Resp: 12 16 (!) 21 17  Temp: 98.1 F (36.7 C)     TempSrc: Oral     SpO2: 97% 98% (!) 87% 94%  Weight:      Height:        Intake/Output Summary (Last 24 hours) at 05/09/2019 0803 Last data filed at 05/09/2019 0700 Gross per 24 hour  Intake 402.14 ml  Output 750 ml  Net -347.86 ml   Last 3 Weights 05/07/2019 12/10/2017 10/11/2017  Weight (lbs) 196 lb 185 lb 185 lb  Weight (kg) 88.905 kg 83.915 kg 83.915 kg      Telemetry    Sinus rhythm occasional PVCs.- Personally Reviewed  ECG    Right bundle branch block- Personally Reviewed  Physical Exam   GEN: No acute distress.   Neck: No JVD Cardiac: RRR, no murmurs, rubs, or gallops.  Respiratory: Clear to auscultation bilaterally. GI: Soft, nontender, non-distended  MS: No significant left lower extremity edema, improved; No deformity. Neuro:  Nonfocal  Psych: Normal affect   Labs    High Sensitivity Troponin:   Recent Labs  Lab 05/07/19 1223  TROPONINIHS 16      Chemistry Recent Labs  Lab 05/07/19 1223 05/08/19 0721  NA  138 140  K 4.3 4.1  CL 108 106  CO2 20* 26  GLUCOSE 108* 104*  BUN 11 8  CREATININE 0.83 0.79  CALCIUM 8.8* 9.3  GFRNONAA >60 >60  GFRAA >60 >60  ANIONGAP 10 8     Hematology Recent Labs  Lab 05/07/19 1223 05/08/19 0721 05/09/19 0229  WBC 6.3 5.6 5.6  RBC 4.22 4.16* 4.04*  HGB 14.8 14.5 14.3  HCT 43.1 43.0 41.4  MCV 102.1* 103.4* 102.5*  MCH 35.1* 34.9* 35.4*  MCHC 34.3 33.7 34.5  RDW 13.3 13.2 13.3  PLT 153 158 143*    BNP Recent Labs  Lab 05/07/19 1223  BNP 91.8     DDimer No results for input(s): DDIMER in the last 168 hours.   Radiology    CT ANGIO CHEST PE W OR WO CONTRAST  Result Date: 05/07/2019 CLINICAL DATA:  Shortness of breath EXAM: CT ANGIOGRAPHY CHEST WITH CONTRAST TECHNIQUE: Multidetector CT imaging of the chest was performed using the standard protocol during bolus administration of intravenous contrast. Multiplanar CT image reconstructions and MIPs were obtained to evaluate the vascular anatomy. CONTRAST:  69mL ISOVUE-370 IOPAMIDOL (ISOVUE-370) INJECTION 76% COMPARISON:  Chest radiograph May 06, 2019 FINDINGS: Cardiovascular: There  is extensive pulmonary embolus, arising in both main pulmonary arteries in a saddle type distribution with extension of pulmonary emboli into multiple upper and lower lobe pulmonary artery branches bilaterally. The right ventricle to left ventricle diameter ratio is less than 0.9, not indicative of right heart strain. There is no thoracic aortic aneurysm or dissection. Visualized great vessels demonstrate occasional mild foci of calcification. Note that the right innominate and left common carotid arteries arise as a common trunk, an anatomic variant. There is aortic atherosclerosis as well as foci of coronary artery calcification. There is no pericardial effusion or pericardial thickening. Mediastinum/Nodes: Thyroid appears unremarkable. There is no appreciable thoracic adenopathy by size criteria. There are occasional  subcentimeter mediastinal lymph nodes which do not meet size criteria for pathologic significance. There is a small hiatal hernia. Lungs/Pleura: There are scattered areas of atelectatic change. No evident consolidation or pulmonary infarct. No pleural effusions are evident. Upper Abdomen: There is upper abdominal aortic atherosclerosis. Visualized upper abdominal structures otherwise appear unremarkable. Musculoskeletal: There are foci of degenerative change in the thoracic spine. No blastic or lytic bone lesions are evident. There are no chest wall lesions. Review of the MIP images confirms the above findings. IMPRESSION: 1. Extensive bilateral pulmonary emboli. Pulmonary emboli arise from both main pulmonary outflow tracks in a saddle type distribution. Right ventricle to left ventricular diameter is less than 0.9, not indicative of right heart strain. 2. No thoracic aortic aneurysm or dissection. There is aortic atherosclerosis as well as foci of coronary artery calcification. 3. Areas of scattered atelectasis. No edema or consolidation. No evident pulmonary infarct. 4.  Small hiatal hernia. 5.  No adenopathy. Critical Value/emergent results were called by telephone at the time of interpretation on 05/07/2019 at 11:03 am to providerJOHN GRIFFIN , who verbally acknowledged these results. Aortic Atherosclerosis (ICD10-I70.0). Electronically Signed   By: Lowella Grip III M.D.   On: 05/07/2019 11:03   IR IVC FILTER PLMT / S&I Burke Keels GUID/MOD SED  Result Date: 05/08/2019 INDICATION: 79 year old with a large pulmonary embolism and left lower extremity DVT. Patient is anticoagulated but plan for IVC filter placement due to the large clot burden in the pulmonary arteries. EXAM: IVC FILTER PLACEMENT; IVC VENOGRAM; ULTRASOUND FOR VASCULAR ACCESS Physician: Stephan Minister. Anselm Pancoast, MD MEDICATIONS: None. ANESTHESIA/SEDATION: Fentanyl 50 mcg IV; Versed 1.0 mg IV Moderate Sedation Time:  18 minutes The patient was continuously  monitored during the procedure by the interventional radiology nurse under my direct supervision. CONTRAST:  50 mL Omnipaque 300 FLUOROSCOPY TIME:  Fluoroscopy Time: 2 minutes, 24 seconds, 43 mGy COMPLICATIONS: None immediate. PROCEDURE: The procedure was explained to the patient. The risks and benefits of the procedure were discussed and the patient's questions were addressed. Informed consent was obtained from the patient. Ultrasound demonstrated a patent right internal jugular vein. Ultrasound images were obtained for documentation. The right neck was prepped and draped in a sterile fashion. Maximal barrier sterile technique was utilized including caps, mask, sterile gowns, sterile gloves, sterile drape, hand hygiene and skin antiseptic. The skin was anesthetized with 1% lidocaine. A 21 gauge needle was directed into the vein with ultrasound guidance and a micropuncture dilator set was placed. A wire was advanced into the IVC. The filter sheath was advanced over the wire into the IVC. An IVC venogram was performed. Bilateral renal veins were cannulated with a 5 French catheter and Bentson wire to confirm location. Fluoroscopic images were obtained for documentation. A Bard Denali filter was deployed below the lowest renal  vein. A follow-up venogram was performed and the vascular sheath was removed with manual compression. FINDINGS: IVC was patent. Bilateral renal veins were identified. The filter was deployed below the lowest renal vein. Follow-up venogram confirmed placement within the IVC and below the renal veins. IMPRESSION: Successful placement of a retrievable IVC filter. PLAN: This IVC filter is potentially retrievable. The patient will be assessed for filter retrieval by Interventional Radiology in approximately 8-12 weeks. Further recommendations regarding filter retrieval, continued surveillance or declaration of device permanence, will be made at that time. Electronically Signed   By: Markus Daft M.D.    On: 05/08/2019 17:41   ECHOCARDIOGRAM COMPLETE  Result Date: 05/07/2019   ECHOCARDIOGRAM REPORT   Patient Name:   JOSHUA MOCKLER Date of Exam: 05/07/2019 Medical Rec #:  [id number]     Height:       71.0 in Accession #:    [id number]    Weight:       185.0 lb Date of Birth:  [birth year]      BSA:          2.04 m Patient Age:    65 years      BP:           146/83 mmHg Patient Gender: M             HR:           75 bpm. Exam Location:  Inpatient Procedure: 2D Echo, Cardiac Doppler and Color Doppler Indications:    R06.02 SOB; I26.02 Pulmonary embolus  History:        Patient has no prior history of Echocardiogram examinations.                 Arrythmias:RBBB, Signs/Symptoms:Dyspnea; Risk                 Factors:Hypertension and Dyslipidemia.  Sonographer:    Roseanna Rainbow RDCS Referring Phys: [id number] Tannie Koskela C Westin Knotts  Sonographer Comments: Suboptimal subcostal window. IMPRESSIONS  1. Left ventricular ejection fraction, by visual estimation, is 55 to 60%. The left ventricle has normal function. There is moderately increased left ventricular hypertrophy.  2. Moderate asymmetric hypertrophy measuring 49mm in basal septum (61mm in posterior wall)  3. Global right ventricle has normal systolic function.The right ventricular size is mildly enlarged.  4. The mitral valve is normal in structure. No evidence of mitral valve regurgitation.  5. The aortic valve is tricuspid. Aortic valve regurgitation is trivial. Mild aortic valve sclerosis without stenosis.  6. The tricuspid valve is normal in structure.  7. The pulmonic valve was not well visualized. Pulmonic valve regurgitation is trivial.  8. Left atrial size was normal.  9. Right atrial size was normal. 10. The inferior vena cava is normal in size with greater than 50% respiratory variability, suggesting right atrial pressure of 3 mmHg. 11. TR signal is inadequate for assessing pulmonary artery systolic pressure. 12. Thrombus seen at the bifurcation of the pulmonary arteries. FINDINGS   Left Ventricle: Left ventricular ejection fraction, by visual estimation, is 55 to 60%. The left ventricle has normal function. The left ventricle has no regional wall motion abnormalities. The left ventricular internal cavity size was the left ventricle is normal in size. There is moderately increased left ventricular hypertrophy. Asymmetric left ventricular hypertrophy. Left ventricular diastolic parameters were normal. Right Ventricle: The right ventricular size is mildly enlarged. No increase in right ventricular wall thickness. Global RV systolic function is has normal systolic function. Left Atrium: Left atrial  size was normal in size. Right Atrium: Right atrial size was normal in size Pericardium: Trivial pericardial effusion is present. Mitral Valve: The mitral valve is normal in structure. No evidence of mitral valve regurgitation. Tricuspid Valve: The tricuspid valve is normal in structure. Tricuspid valve regurgitation is trivial. Aortic Valve: The aortic valve is tricuspid. Aortic valve regurgitation is trivial. Mild aortic valve sclerosis is present, with no evidence of aortic valve stenosis. Pulmonic Valve: The pulmonic valve was not well visualized. Pulmonic valve regurgitation is trivial. Pulmonic regurgitation is trivial. Aorta: The aortic root is normal in size and structure. Pulmonary Artery: Suspect thromboembolism in the main pulmonary artery. Venous: The inferior vena cava is normal in size with greater than 50% respiratory variability, suggesting right atrial pressure of 3 mmHg. IAS/Shunts: The interatrial septum was not well visualized.  LEFT VENTRICLE PLAX 2D LVIDd:         4.00 cm       Diastology LVIDs:         3.20 cm       LV e' lateral:   5.33 cm/s LV PW:         1.20 cm       LV E/e' lateral: 8.6 LV IVS:        1.70 cm       LV e' medial:    5.00 cm/s LVOT diam:     2.00 cm       LV E/e' medial:  9.2 LV SV:         29 ml LV SV Index:   14.10 LVOT Area:     3.14 cm  LV Volumes (MOD) LV  area d, A2C:    31.90 cm LV area d, A4C:    31.00 cm LV area s, A2C:    18.20 cm LV area s, A4C:    18.60 cm LV major d, A2C:   8.94 cm LV major d, A4C:   8.71 cm LV major s, A2C:   7.14 cm LV major s, A4C:   7.54 cm LV vol d, MOD A2C: 92.8 ml LV vol d, MOD A4C: 89.7 ml LV vol s, MOD A2C: 38.1 ml LV vol s, MOD A4C: 38.5 ml LV SV MOD A2C:     54.7 ml LV SV MOD A4C:     89.7 ml LV SV MOD BP:      53.1 ml RIGHT VENTRICLE             IVC RV S prime:     15.00 cm/s  IVC diam: 1.30 cm TAPSE (M-mode): 2.7 cm LEFT ATRIUM             Index       RIGHT ATRIUM           Index LA diam:        3.70 cm 1.81 cm/m  RA Area:     18.30 cm LA Vol (A2C):   42.2 ml 20.69 ml/m RA Volume:   51.90 ml  25.44 ml/m LA Vol (A4C):   37.7 ml 18.48 ml/m LA Biplane Vol: 41.8 ml 20.49 ml/m  AORTIC VALVE             PULMONIC VALVE LVOT Vmax:   107.00 cm/s PR End Diast Vel: 2.09 msec LVOT Vmean:  71.400 cm/s LVOT VTI:    0.206 m  AORTA Ao Root diam: 3.55 cm MITRAL VALVE MV Area (PHT): 2.62 cm  SHUNTS MV PHT:        83.81 msec           Systemic VTI:  0.21 m MV Decel Time: 289 msec             Systemic Diam: 2.00 cm MV E velocity: 46.00 cm/s 103 cm/s MV A velocity: 78.30 cm/s 70.3 cm/s MV E/A ratio:  0.59       1.5  Oswaldo Milian MD Electronically signed by Oswaldo Milian MD Signature Date/Time: 05/07/2019/9:38:08 PM    Final    VAS [place] LOWER EXTREMITY VENOUS (DVT)  Result Date: 05/08/2019  Lower Venous Study Indications: Pulmonary embolism.  Risk Factors: Surgery. Anticoagulation: Heparin. Comparison Study: No prior studies. Performing Technologist: Oliver Hum RVT  Examination Guidelines: A complete evaluation includes B-mode imaging, spectral Doppler, color Doppler, and power Doppler as needed of all accessible portions of each vessel. Bilateral testing is considered an integral part of a complete examination. Limited examinations for reoccurring indications may be performed as noted.   +---------+---------------+---------+-----------+----------+--------------+ RIGHT    CompressibilityPhasicitySpontaneityPropertiesThrombus Aging +---------+---------------+---------+-----------+----------+--------------+ CFV      Full           Yes      Yes                                 +---------+---------------+---------+-----------+----------+--------------+ SFJ      Full                                                        +---------+---------------+---------+-----------+----------+--------------+ FV Prox  Full                                                        +---------+---------------+---------+-----------+----------+--------------+ FV Mid   Full                                                        +---------+---------------+---------+-----------+----------+--------------+ FV DistalFull                                                        +---------+---------------+---------+-----------+----------+--------------+ PFV      Full                                                        +---------+---------------+---------+-----------+----------+--------------+ POP      Full           Yes      Yes                                 +---------+---------------+---------+-----------+----------+--------------+  PTV      Full                                                        +---------+---------------+---------+-----------+----------+--------------+ PERO     Full                                                        +---------+---------------+---------+-----------+----------+--------------+   +---------+---------------+---------+-----------+----------+--------------+ LEFT     CompressibilityPhasicitySpontaneityPropertiesThrombus Aging +---------+---------------+---------+-----------+----------+--------------+ CFV      Full           Yes      Yes                                  +---------+---------------+---------+-----------+----------+--------------+ SFJ      Full                                                        +---------+---------------+---------+-----------+----------+--------------+ FV Prox  Partial        Yes      Yes                  Acute          +---------+---------------+---------+-----------+----------+--------------+ FV Mid   Partial        Yes      Yes                  Acute          +---------+---------------+---------+-----------+----------+--------------+ FV DistalNone           No       No                   Acute          +---------+---------------+---------+-----------+----------+--------------+ POP      Partial        No       No                   Acute          +---------+---------------+---------+-----------+----------+--------------+ PTV      None                                         Acute          +---------+---------------+---------+-----------+----------+--------------+ PERO     None                                         Acute          +---------+---------------+---------+-----------+----------+--------------+     Summary: Right: No evidence of common femoral vein obstruction. Left: Findings consistent with acute deep vein thrombosis involving the left femoral vein, left popliteal vein, left posterior tibial veins, and left  peroneal veins. No cystic structure found in the popliteal fossa.  *See table(s) above for measurements and observations. Electronically signed by Monica Martinez MD on 05/08/2019 at 5:12:20 PM.    Final     Cardiac Studies   Echo with normal left ventricular ejection fraction, mildly dilated right ventricle, overall normal/low normal right ventricular function  Patient Profile     79 y.o. male acute saddle pulmonary embolus with left acute DVT femoral vein distal not including the common femoral.  Unprovoked.  Assessment & Plan    Unprovoked saddle PE/DVT -No prior  thromboembolic disease.  Up-to-date cancer screenings.  No unusual masses on CT scan.  CBC normal.  Non-smoker.  No recent sedentary state.  No recent injuries.  Very active, walks 2 to 3 miles a day.  Goes to the gym as well.  Has noticed increased shortness of breath possibly over the last 5 weeks.  2 weeks ago during nail cutting, technician noted left lower extremity swelling. -IVC filter was placed on 05/08/2019 by Dr. Anselm Pancoast.  This was placed because of large burden of clot, nonocclusive saddle PE and a large left lower extremity DVT.  He would be at high risk of mortality if DVT were to migrate.  Originally there was thought about thrombolysis/catheter directed thrombolysis or perhaps mechanical thrombectomy however in further discussion with Dr. Jiles Harold of interventional radiology and Dr. Laurence Ferrari, his PE was in its proximal segments nonocclusive and potentially given the chronicity the benefit of catheter directed TPA may not be as robust.  Also clinically, he is remained stable without any evidence of severe right heart strain. -He has been receiving IV heparin. -He woke this morning around the unit well.  No significant shortness of breath.  We will go ahead and transition him from IV heparin to Xarelto 15 twice daily.  I will watch him here today on telemetry and continue to ambulate as needed. -Tomorrow morning we will give him his dose of Xarelto and discharge him barring any other complications. -Given the unprovoked nature of his PE and proximal DVT and large burden of clot, it may make sense for him to continue anticoagulation indefinitely perhaps after 1 year transitioning to prophylactic dosing of Xarelto.  We will continue to have an ongoing discussion about this.  Hyperlipidemia -Continue Crestor  Essential hypertension -Continue current medications as above.  Well-controlled.  For questions or updates, please contact [place] Please consult [email or web address] for contact info under         Signed, Candee Furbish, MD  05/09/2019, 8:03 AM

## 2019-05-10 ENCOUNTER — Encounter (HOSPITAL_COMMUNITY): Payer: Self-pay | Admitting: Cardiology

## 2019-05-10 ENCOUNTER — Telehealth: Payer: Self-pay | Admitting: Physician Assistant

## 2019-05-10 DIAGNOSIS — Z95828 Presence of other vascular implants and grafts: Secondary | ICD-10-CM

## 2019-05-10 DIAGNOSIS — I82402 Acute embolism and thrombosis of unspecified deep veins of left lower extremity: Secondary | ICD-10-CM

## 2019-05-10 LAB — CBC
HCT: 40.4 % (ref 39.0–52.0)
Hemoglobin: 13.7 g/dL (ref 13.0–17.0)
MCH: 35.2 pg — ABNORMAL HIGH (ref 26.0–34.0)
MCHC: 33.9 g/dL (ref 30.0–36.0)
MCV: 103.9 fL — ABNORMAL HIGH (ref 80.0–100.0)
Platelets: 157 10*3/uL (ref 150–400)
RBC: 3.89 MIL/uL — ABNORMAL LOW (ref 4.22–5.81)
RDW: 13.3 % (ref 11.5–15.5)
WBC: 5 10*3/uL (ref 4.0–10.5)
nRBC: 0 % (ref 0.0–0.2)

## 2019-05-10 MED ORDER — RIVAROXABAN 20 MG PO TABS: 20.0000 mg | ORAL_TABLET | Freq: Every day | ORAL | 5 refills | Status: DC

## 2019-05-10 MED ORDER — XARELTO VTE STARTER PACK 15 & 20 MG PO TBPK: ORAL_TABLET | ORAL | 0 refills | Status: DC

## 2019-05-10 NOTE — Discharge Summary (Addendum)
Discharge Summary    Patient ID: James Noble MRN: [id number]; DOB: 1940/12/29  Admit date: 05/07/2019 Discharge date: 05/10/2019  Primary Care Provider: Lavone Orn, MD  Primary Cardiologist: Candee Furbish, MD  Primary Electrophysiologist:  None   Discharge Diagnoses    Principal Problem:   Deep vein thrombosis (DVT) of left lower extremity [facility]) Active Problems:   Hyperlipidemia   HTN (hypertension)   Pulmonary emboli (HCC)   S/P IVC filter   Diagnostic Studies/Procedures    1. 05/08/19 Interventional Radiology Procedure: Indications: Saddle pulmonary embolism with left lower extremity DVT Procedure: IVC filter placement Findings: Patent IVC.  Denali filter placed below renal veins Complications: None  EBL: less than 10 ml  2. 2D echo 05/07/19  1. Left ventricular ejection fraction, by visual estimation, is 55 to 60%. The left ventricle has normal function. There is moderately increased left ventricular hypertrophy.  2. Moderate asymmetric hypertrophy measuring 73mm in basal septum (41mm in posterior wall)  3. Global right ventricle has normal systolic function.The right ventricular size is mildly enlarged.  4. The mitral valve is normal in structure. No evidence of mitral valve regurgitation.  5. The aortic valve is tricuspid. Aortic valve regurgitation is trivial. Mild aortic valve sclerosis without stenosis.  6. The tricuspid valve is normal in structure.  7. The pulmonic valve was not well visualized. Pulmonic valve regurgitation is trivial.  8. Left atrial size was normal.  9. Right atrial size was normal. 10. The inferior vena cava is normal in size with greater than 50% respiratory variability, suggesting right atrial pressure of 3 mmHg. 11. TR signal is inadequate for assessing pulmonary artery systolic pressure. 12. Thrombus seen at the bifurcation of the pulmonary arteries. 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 Aside from basal cell he has no cancer history, no sedentary status, no recent surgeries, no prior history of thrombosis. Approximately 4 weeks ago he started to notice more shortness of breath when walking through the neighborhood, usually 2 to 3 miles a day with his wife James Noble. When going up hills he was noticing more dyspnea and that it was harder than usual. He denied any chest pain. He also noticed left lower extremity edema. He was given Xarelto samples by PCP to begin immediately and sent for outpatient CT which was done 05/07/19 showing extensive bilateral saddle PE, no evidence of right heart strain. He was sent to the ED for further evaluation where he was admitted by the cardiology service as the patient was known to [place] from prior encounters. His O2 sat was normal and he was not tachycardic.  Hospital Course     1. Unprovoked acute saddle pulmonary embolism with acute left femoral vein to distal DVT - Covid test negative - Lower extremity ultrasound confirmed large left lower extremity DVT - He was seen by IR to consider TPA/EKOS. They felt that while the thrombus burden was impressive, the vast majority was non- occlusive which may explain the absence of evidence of right heart strain on CTA and with regard to serum biomarkers. He was also followed by PCCM during admission. - He was transitioned from heparin to Xarelto. Per discussion with Dr. Marlou Porch and pharmacist, we will send home with the blister pack of Xarelto to include 15mg  BID x  21 days from today on then 20mg  daily thereafter. The patient was started on Xarelto here in the hospital yesterday but for ease of use Dr. Marlou Porch was OK with initiation of blister pack at discharge. We will have him discard the first AM tablet since he received  his dose here today. He was given coupon for this as well as maintenance dose thereafter. -He had IVC filter placed 05/08/19 because of large left lower extremity DVT with concomitant saddle pulmonary embolus, nonobstructive for fear of migration would cause high morbidity/mortality. Plan would be to retrieve IVC filter within the next 1 to 3 months. Dr. Marlou Porch plans discuss with Dr. Markus Daft of interventional radiology for coordination of care at time of follow-up -Thankfully no evidence of significant right heart strain, no hemodynamic instability, no hypoxia.  Troponins were normal. Echo findings as above. - Given his unprovoked PE/DVT, we will consider potential long-term anticoagulation, for instance after 6 months of full dose Xarelto treatment, consider 10 mg daily dosing thereafter if he is willing and amenable and his bleeding risk remains low. Dr. Marlou Porch mentions he was due for screening colonoscopy in 1 year - may be prudent to consider proceeding once he has completed 6 months of anticoagulation, can be discussed further in follow-up   2. Acute DVT left-sided femoral vein to distal -Anticoagulation as above.  IVC filter was placed out of fear of migration leading to high morbidity given his overall PE clot burden. See above regarding IV filter removal. -Dr. Marlou Porch felt early ambulation from clinical trials is very reasonable.  He was cleared to continue with daily walking but slowly increase exercise efforts.  He should refrain from any excessive physical leg work/strength training at this time.  - No sign of post-thrombotic syndrome  3. Essential hypertension -Continue current medications well-controlled.  4. Hyperlipidemia -No change with Crestor.  Dr. Marlou Porch suggests avoiding use of sildenafil for the first 1 to 2 months to make sure we do not have any decrease in blood pressure. He felt the patient could drive locally but no longer distance travel. The patient was advised to remain  out of work until cleared  I have sent a message to our office's scheduling team requesting a follow-up appointment in 1-2 weeks with Dr. Marlou Porch, and our office will call the patient with this information. Cyclobenzaprine, gapabentin, and naproxen were removed from his medicine list since he indicated he was no longer taking these.   Did the patient have an acute coronary syndrome (MI, NSTEMI, STEMI, etc) this admission?:  No                               Did the patient have a percutaneous coronary intervention (stent / angioplasty)?:  No.   _____________  Discharge Vitals Blood pressure (!) 146/79, pulse 98, temperature 98.1 F (36.7 C), temperature source Oral, resp. rate 16, height 5\' 11"  (1.803 m), weight 88.9 kg, SpO2 91 %.  Filed Weights   05/07/19 1500  Weight: 88.9 kg    Labs & Radiologic Studies    CBC Recent Labs    05/07/19 1223 05/09/19 0229 05/10/19 0134  WBC 6.3 5.6 5.0  NEUTROABS 4.3  --   --   HGB 14.8 14.3 13.7  HCT 43.1 41.4 40.4  MCV 102.1* 102.5* 103.9*  PLT 153 143* [id number]   Basic Metabolic Panel Recent Labs    05/07/19 1223 05/08/19 0721  NA 138 140  K 4.3 4.1  CL 108 106  CO2 20* 26  GLUCOSE 108* 104*  BUN 11 8  CREATININE 0.83 0.79  CALCIUM 8.8* 9.3   High Sensitivity Troponin:   Recent Labs  Lab 05/07/19 1223  TROPONINIHS 16   _____________  DG Chest 2 View  Result Date: 05/06/2019 CLINICAL DATA:  Dyspnea on exertion for several weeks EXAM: CHEST - 2 VIEW COMPARISON:  03/12/2014 FINDINGS: The heart size and mediastinal contours are within normal limits. Both lungs are clear. The visualized skeletal structures are unremarkable. IMPRESSION: No active cardiopulmonary disease. Electronically Signed   By: Inez Catalina M.D.   On: 05/06/2019 15:23   CT ANGIO CHEST PE W OR WO CONTRAST  Result Date: 05/07/2019 CLINICAL DATA:  Shortness of breath EXAM: CT ANGIOGRAPHY CHEST WITH CONTRAST TECHNIQUE: Multidetector CT imaging of the chest was performed  using the standard protocol during bolus administration of intravenous contrast. Multiplanar CT image reconstructions and MIPs were obtained to evaluate the vascular anatomy. CONTRAST:  5mL ISOVUE-370 IOPAMIDOL (ISOVUE-370) INJECTION 76% COMPARISON:  Chest radiograph May 06, 2019 FINDINGS: Cardiovascular: There is extensive pulmonary embolus, arising in both main pulmonary arteries in a saddle type distribution with extension of pulmonary emboli into multiple upper and lower lobe pulmonary artery branches bilaterally. The right ventricle to left ventricle diameter ratio is less than 0.9, not indicative of right heart strain. There is no thoracic aortic aneurysm or dissection. Visualized great vessels demonstrate occasional mild foci of calcification. Note that the right innominate and left common carotid arteries arise as a common trunk, an anatomic variant. There is aortic atherosclerosis as well as foci of coronary artery calcification. There is no pericardial effusion or pericardial thickening. Mediastinum/Nodes: Thyroid appears unremarkable. There is no appreciable thoracic adenopathy by size criteria. There are occasional subcentimeter mediastinal lymph nodes which do not meet size criteria for pathologic significance. There is a small hiatal hernia. Lungs/Pleura: There are scattered areas of atelectatic change. No evident consolidation or pulmonary infarct. No pleural effusions are evident. Upper Abdomen: There is upper abdominal aortic atherosclerosis. Visualized upper abdominal structures otherwise appear unremarkable. Musculoskeletal: There are foci of degenerative change in the thoracic spine. No blastic or lytic bone lesions are evident. There are no chest wall lesions. Review of the MIP images confirms the above findings. IMPRESSION: 1. Extensive bilateral pulmonary emboli. Pulmonary emboli arise from both main pulmonary outflow tracks in a saddle type distribution. Right ventricle to left  ventricular diameter is less than 0.9, not indicative of right heart strain. 2. No thoracic aortic aneurysm or dissection. There is aortic atherosclerosis as well as foci of coronary artery calcification. 3. Areas of scattered atelectasis. No edema or consolidation. No evident pulmonary infarct. 4.  Small hiatal hernia. 5.  No adenopathy. Critical Value/emergent results were called by telephone at the time of interpretation on 05/07/2019 at 11:03 am to providerJOHN GRIFFIN , who verbally acknowledged these results. Aortic Atherosclerosis (ICD10-I70.0). Electronically Signed   By: Lowella Grip III M.D.   On: 05/07/2019 11:03   IR IVC FILTER PLMT / S&I Burke Keels GUID/MOD SED  Result Date: 05/08/2019 INDICATION: 79 year old with a large pulmonary embolism and left lower extremity DVT. Patient is anticoagulated but plan for IVC filter placement due to the large clot burden in the pulmonary arteries. EXAM: IVC FILTER PLACEMENT; IVC VENOGRAM; ULTRASOUND FOR VASCULAR ACCESS Physician: Stephan Minister. Anselm Pancoast, MD MEDICATIONS: None. ANESTHESIA/SEDATION: Fentanyl 50 mcg IV; Versed 1.0 mg IV Moderate Sedation Time:  18 minutes The patient was continuously monitored  during the procedure by the interventional radiology nurse under my direct supervision. CONTRAST:  50 mL Omnipaque 300 FLUOROSCOPY TIME:  Fluoroscopy Time: 2 minutes, 24 seconds, 43 mGy COMPLICATIONS: None immediate. PROCEDURE: The procedure was explained to the patient. The risks and benefits of the procedure were discussed and the patient's questions were addressed. Informed consent was obtained from the patient. Ultrasound demonstrated a patent right internal jugular vein. Ultrasound images were obtained for documentation. The right neck was prepped and draped in a sterile fashion. Maximal barrier sterile technique was utilized including caps, mask, sterile gowns, sterile gloves, sterile drape, hand hygiene and skin antiseptic. The skin was anesthetized with 1% lidocaine.  A 21 gauge needle was directed into the vein with ultrasound guidance and a micropuncture dilator set was placed. A wire was advanced into the IVC. The filter sheath was advanced over the wire into the IVC. An IVC venogram was performed. Bilateral renal veins were cannulated with a 5 French catheter and Bentson wire to confirm location. Fluoroscopic images were obtained for documentation. A Bard Denali filter was deployed below the lowest renal vein. A follow-up venogram was performed and the vascular sheath was removed with manual compression. FINDINGS: IVC was patent. Bilateral renal veins were identified. The filter was deployed below the lowest renal vein. Follow-up venogram confirmed placement within the IVC and below the renal veins. IMPRESSION: Successful placement of a retrievable IVC filter. PLAN: This IVC filter is potentially retrievable. The patient will be assessed for filter retrieval by Interventional Radiology in approximately 8-12 weeks. Further recommendations regarding filter retrieval, continued surveillance or declaration of device permanence, will be made at that time. Electronically Signed   By: Markus Daft M.D.   On: 05/08/2019 17:41   ECHOCARDIOGRAM COMPLETE  Result Date: 05/07/2019   ECHOCARDIOGRAM REPORT   Patient Name:   James Noble Date of Exam: 05/07/2019 Medical Rec #:  [id number]     Height:       71.0 in Accession #:    [id number]    Weight:       185.0 lb Date of Birth:  01/27/1941      BSA:          2.04 m Patient Age:    81 years      BP:           146/83 mmHg Patient Gender: M             HR:           75 bpm. Exam Location:  Inpatient Procedure: 2D Echo, Cardiac Doppler and Color Doppler Indications:    R06.02 SOB; I26.02 Pulmonary embolus  History:        Patient has no prior history of Echocardiogram examinations.                 Arrythmias:RBBB, Signs/Symptoms:Dyspnea; Risk                 Factors:Hypertension and Dyslipidemia.  Sonographer:    Roseanna Rainbow RDCS Referring Phys:  [id number] Legaci Tarman C Geraldo Haris  Sonographer Comments: Suboptimal subcostal window. IMPRESSIONS  1. Left ventricular ejection fraction, by visual estimation, is 55 to 60%. The left ventricle has normal function. There is moderately increased left ventricular hypertrophy.  2. Moderate asymmetric hypertrophy measuring 81mm in basal septum (58mm in posterior wall)  3. Global right ventricle has normal systolic function.The right ventricular size is mildly enlarged.  4. The mitral valve is normal in structure. No evidence of mitral valve regurgitation.  5. The aortic valve is tricuspid. Aortic valve regurgitation is trivial. Mild aortic valve sclerosis without stenosis.  6. The tricuspid valve is normal in structure.  7. The pulmonic valve was not well visualized. Pulmonic valve regurgitation is trivial.  8. Left atrial size was normal.  9. Right atrial size was normal. 10. The inferior vena cava is normal in size with greater than 50% respiratory variability, suggesting right atrial pressure of 3 mmHg. 11. TR signal is inadequate for assessing pulmonary artery systolic pressure. 12. Thrombus seen at the bifurcation of the pulmonary arteries. FINDINGS  Left Ventricle: Left ventricular ejection fraction, by visual estimation, is 55 to 60%. The left ventricle has normal function. The left ventricle has no regional wall motion abnormalities. The left ventricular internal cavity size was the left ventricle is normal in size. There is moderately increased left ventricular hypertrophy. Asymmetric left ventricular hypertrophy. Left ventricular diastolic parameters were normal. Right Ventricle: The right ventricular size is mildly enlarged. No increase in right ventricular wall thickness. Global RV systolic function is has normal systolic function. Left Atrium: Left atrial size was normal in size. Right Atrium: Right atrial size was normal in size Pericardium: Trivial pericardial effusion is present. Mitral Valve: The mitral valve is normal  in structure. No evidence of mitral valve regurgitation. Tricuspid Valve: The tricuspid valve is normal in structure. Tricuspid valve regurgitation is trivial. Aortic Valve: The aortic valve is tricuspid. Aortic valve regurgitation is trivial. Mild aortic valve sclerosis is present, with no evidence of aortic valve stenosis. Pulmonic Valve: The pulmonic valve was not well visualized. Pulmonic valve regurgitation is trivial. Pulmonic regurgitation is trivial. Aorta: The aortic root is normal in size and structure. Pulmonary Artery: Suspect thromboembolism in the main pulmonary artery. Venous: The inferior vena cava is normal in size with greater than 50% respiratory variability, suggesting right atrial pressure of 3 mmHg. IAS/Shunts: The interatrial septum was not well visualized.  LEFT VENTRICLE PLAX 2D LVIDd:         4.00 cm       Diastology LVIDs:         3.20 cm       LV e' lateral:   5.33 cm/s LV PW:         1.20 cm       LV E/e' lateral: 8.6 LV IVS:        1.70 cm       LV e' medial:    5.00 cm/s LVOT diam:     2.00 cm       LV E/e' medial:  9.2 LV SV:         29 ml LV SV Index:   14.10 LVOT Area:     3.14 cm  LV Volumes (MOD) LV area d, A2C:    31.90 cm LV area d, A4C:    31.00 cm LV area s, A2C:    18.20 cm LV area s, A4C:    18.60 cm LV major d, A2C:   8.94 cm LV major d, A4C:   8.71 cm LV major s, A2C:   7.14 cm LV major s, A4C:   7.54 cm LV vol d, MOD A2C: 92.8 ml LV vol d, MOD A4C: 89.7 ml LV vol s, MOD A2C: 38.1 ml LV vol s, MOD A4C: 38.5 ml LV SV MOD A2C:     54.7 ml LV SV MOD A4C:     89.7 ml LV SV MOD BP:      53.1 ml RIGHT  VENTRICLE             IVC RV S prime:     15.00 cm/s  IVC diam: 1.30 cm TAPSE (M-mode): 2.7 cm LEFT ATRIUM             Index       RIGHT ATRIUM           Index LA diam:        3.70 cm 1.81 cm/m  RA Area:     18.30 cm LA Vol (A2C):   42.2 ml 20.69 ml/m RA Volume:   51.90 ml  25.44 ml/m LA Vol (A4C):   37.7 ml 18.48 ml/m LA Biplane Vol: 41.8 ml 20.49 ml/m  AORTIC VALVE              PULMONIC VALVE LVOT Vmax:   107.00 cm/s PR End Diast Vel: 2.09 msec LVOT Vmean:  71.400 cm/s LVOT VTI:    0.206 m  AORTA Ao Root diam: 3.55 cm MITRAL VALVE MV Area (PHT): 2.62 cm             SHUNTS MV PHT:        83.81 msec           Systemic VTI:  0.21 m MV Decel Time: 289 msec             Systemic Diam: 2.00 cm MV E velocity: 46.00 cm/s 103 cm/s MV A velocity: 78.30 cm/s 70.3 cm/s MV E/A ratio:  0.59       1.5  Oswaldo Milian MD Electronically signed by Oswaldo Milian MD Signature Date/Time: 05/07/2019/9:38:08 PM    Final    VAS [place] LOWER EXTREMITY VENOUS (DVT)  Result Date: 05/08/2019  Lower Venous Study Indications: Pulmonary embolism.  Risk Factors: Surgery. Anticoagulation: Heparin. Comparison Study: No prior studies. Performing Technologist: Oliver Hum RVT  Examination Guidelines: A complete evaluation includes B-mode imaging, spectral Doppler, color Doppler, and power Doppler as needed of all accessible portions of each vessel. Bilateral testing is considered an integral part of a complete examination. Limited examinations for reoccurring indications may be performed as noted.  +---------+---------------+---------+-----------+----------+--------------+ RIGHT    CompressibilityPhasicitySpontaneityPropertiesThrombus Aging +---------+---------------+---------+-----------+----------+--------------+ CFV      Full           Yes      Yes                                 +---------+---------------+---------+-----------+----------+--------------+ SFJ      Full                                                        +---------+---------------+---------+-----------+----------+--------------+ FV Prox  Full                                                        +---------+---------------+---------+-----------+----------+--------------+ FV Mid   Full                                                         +---------+---------------+---------+-----------+----------+--------------+  FV DistalFull                                                        +---------+---------------+---------+-----------+----------+--------------+ PFV      Full                                                        +---------+---------------+---------+-----------+----------+--------------+ POP      Full           Yes      Yes                                 +---------+---------------+---------+-----------+----------+--------------+ PTV      Full                                                        +---------+---------------+---------+-----------+----------+--------------+ PERO     Full                                                        +---------+---------------+---------+-----------+----------+--------------+   +---------+---------------+---------+-----------+----------+--------------+ LEFT     CompressibilityPhasicitySpontaneityPropertiesThrombus Aging +---------+---------------+---------+-----------+----------+--------------+ CFV      Full           Yes      Yes                                 +---------+---------------+---------+-----------+----------+--------------+ SFJ      Full                                                        +---------+---------------+---------+-----------+----------+--------------+ FV Prox  Partial        Yes      Yes                  Acute          +---------+---------------+---------+-----------+----------+--------------+ FV Mid   Partial        Yes      Yes                  Acute          +---------+---------------+---------+-----------+----------+--------------+ FV DistalNone           No       No                   Acute          +---------+---------------+---------+-----------+----------+--------------+ POP      Partial        No       No  Acute           +---------+---------------+---------+-----------+----------+--------------+ PTV      None                                         Acute          +---------+---------------+---------+-----------+----------+--------------+ PERO     None                                         Acute          +---------+---------------+---------+-----------+----------+--------------+     Summary: Right: No evidence of common femoral vein obstruction. Left: Findings consistent with acute deep vein thrombosis involving the left femoral vein, left popliteal vein, left posterior tibial veins, and left peroneal veins. No cystic structure found in the popliteal fossa.  *See table(s) above for measurements and observations. Electronically signed by Monica Martinez MD on 05/08/2019 at 5:12:20 PM.    Final    Disposition   Pt is being discharged home today in good condition.  Follow-up Plans & Appointments    Follow-up Information    [facility] Follow up.   Specialty: Cardiology Why: Our office will call you for a follow-up appointment with Dr. Marlou Porch. Please call the office if you have not heard from [place] within 2 business days. Contact information: [address], [place] [phone]         Discharge Instructions    Diet - low sodium heart healthy   Complete by: As directed    Discharge instructions   Complete by: As directed    Since you got your dose of Xarelto this morning, you may punch out and discard the first morning tablet in your blister pack. You will use this blister pack as directed otherwise. When that is all up, you will start a regular prescription for 20mg  once daily.  Since your usual pharmacy did not have the Xarelto we needed in stock, we sent these prescriptions to [facility] on [address]. We confirmed they have this in stock. If you need your maintenance prescription rerouted elsewhere after follow-up, just let our office  know.  Cyclobenzaprine, gapabentin, and naproxen were removed from your medicine list since you indicated you are not taking these.  If you notice any bleeding such as blood in stool, black tarry stools, blood in urine, nosebleeds or any other unusual bleeding, call your doctor immediately. It is not normal to have this kind of bleeding while on a blood thinner and usually indicates there is an underlying problem with one of your body systems that needs to be checked out.   Increase activity slowly   Complete by: As directed    Dr. Marlou Porch feels you may continue daily walking but only slowly increase exercise efforts. You should refrain from any excessive physical leg work/strength training at this time. He recommends to avoid use of sildenafil for the first 1 to 2 months to make sure you do not have any decrease in blood pressure.  Dr. Marlou Porch feels you can drive within the city/locally but advises you avoid any longer distance travel at this time. You should remain out of work until seen in follow-up.      Discharge Medications   Allergies as of 05/10/2019  Reactions   Dexlansoprazole Nausea And Vomiting   Nexium [esomeprazole Magnesium]    Nausea & vomiting      Medication List    STOP taking these medications   cyclobenzaprine 5 MG tablet Commonly known as: FLEXERIL   gabapentin 300 MG capsule Commonly known as: NEURONTIN   naproxen 500 MG tablet Commonly known as: NAPROSYN     TAKE these medications   amLODipine 5 MG tablet Commonly known as: NORVASC Take 2 tablets (10 mg total) by mouth daily. Overdue for annual appt w/labs must see provider for refills   bismuth subsalicylate 99991111 MG chewable tablet Commonly known as: PEPTO BISMOL Chew 524 mg by mouth as needed for indigestion.   fluticasone 50 MCG/ACT nasal spray Commonly known as: FLONASE Place 2 sprays into both nostrils at bedtime.   guaiFENesin 600 MG 12 hr tablet Commonly known as: MUCINEX Take 600 mg by  mouth daily.   guaiFENesin 100 MG/5ML liquid Commonly known as: ROBITUSSIN Take 100 mg by mouth at bedtime.   losartan 100 MG tablet Commonly known as: COZAAR Take 1 tablet (100 mg total) by mouth daily. Must keep August appt for future refills   Multi-Vitamins Tabs Take 1 tablet by mouth daily.   PREVAGEN PO Take 1 capsule by mouth daily.   PROBIOTIC ACIDOPHILUS BEADS PO Take 1 capsule by mouth daily.   rivaroxaban 20 MG Tabs tablet Commonly known as: XARELTO Take 1 tablet (20 mg total) by mouth daily. You will start this AFTER you complete your starter pack. What changed:   medication strength  how much to take  when to take this  additional instructions   Xarelto Starter Pack 15 & 20 MG Tbpk Generic drug: Rivaroxaban Follow package directions: Take one 15mg  tablet by mouth twice a day. On day 22, switch to one 20mg  tablet once a day. Take with food. What changed: You were already taking a medication with the same name, and this prescription was added. Make sure you understand how and when to take each.   rosuvastatin 20 MG tablet Commonly known as: CRESTOR TAKE ONE TABLET AT BEDTIME   vitamin B-12 1000 MCG tablet Commonly known as: CYANOCOBALAMIN Take 1,000 mcg by mouth daily.   Vitamin D2 10 MCG (400 UNIT) Tabs Take 1 tablet by mouth daily.          Outstanding Labs/Studies   N/A  Duration of Discharge Encounter   Greater than 30 minutes including physician time.  Signed, Charlie Pitter, PA-C 05/10/2019, 11:06 AM  Personally seen and examined. Agree with above.   Subjective  Feels good, no shortness of breath when walking several laps around the unit. No oxygen requirements. No bleeding. No chest pain. Eating well. Moving bowels.  Inpatient Medications  Scheduled Meds:  . amLODipine 10 mg Oral Daily  . Chlorhexidine Gluconate Cloth 6 each Topical Daily  . Chlorhexidine Gluconate Cloth 6 each Topical Daily  . losartan 100 mg Oral Daily  .  rivaroxaban 15 mg Oral BID  . rosuvastatin 20 mg Oral QHS   Continuous Infusions:   PRN Meds:  acetaminophen, ondansetron (ZOFRAN) IV  Vital Signs         Vitals:   05/10/19 0421 05/10/19 0500 05/10/19 0600 05/10/19 0700  BP:  134/82 129/87 138/84  Pulse:      Resp:  15 17 15   Temp: 97.8 F (36.6 C)     TempSrc: Oral     SpO2:  93% 91% 93%  Weight:  Height:        Intake/Output Summary (Last 24 hours) at 05/10/2019 0816  Last data filed at 05/09/2019 2100     Gross per 24 hour  Intake 135.97 ml  Output --  Net 135.97 ml   Last 3 Weights 05/07/2019 12/10/2017 10/11/2017  Weight (lbs) 196 lb 185 lb 185 lb  Weight (kg) 88.905 kg 83.915 kg 83.915 kg   Telemetry  Sinus rhythm occasional PVCs, right bundle branch block at baseline- Personally Reviewed  ECG  Right bundle branch block- Personally Reviewed  Physical Exam  GEN: No acute distress.  Neck: No JVD  Cardiac: RRR, no murmurs, rubs, or gallops.  Respiratory: Clear to auscultation bilaterally.  GI: Soft, nontender, non-distended  MS: No edema; No deformity.  Neuro: Nonfocal  Psych: Normal affect  Labs  High Sensitivity Troponin:  Last Labs      Recent Labs  Lab 05/07/19  1223  TROPONINIHS 16  Chemistry  Last Labs       Recent Labs  Lab 05/07/19  1223 05/08/19  0721  NA 138 140  K 4.3 4.1  CL 108 106  CO2 20* 26  GLUCOSE 108* 104*  BUN 11 8  CREATININE 0.83 0.79  CALCIUM 8.8* 9.3  GFRNONAA >60 >60  GFRAA >60 >60  ANIONGAP 10 8  Hematology  Last Labs        Recent Labs  Lab 05/08/19  0721 05/09/19  0229 05/10/19  0134  WBC 5.6 5.6 5.0  RBC 4.16* 4.04* 3.89*  HGB 14.5 14.3 13.7  HCT 43.0 41.4 40.4  MCV 103.4* 102.5* 103.9*  MCH 34.9* 35.4* 35.2*  MCHC 33.7 34.5 33.9  RDW 13.2 13.3 13.3  PLT 158 143* 157  BNP  Last Labs      Recent Labs  Lab 05/07/19  1223  BNP 91.8  DDimer  Last Labs   No results for input(s): DDIMER in the last 168 hours.   Radiology   Imaging Results  (Last 48 hours)     Cardiac Studies  Echo with left ventricular ejection fraction, mildly dilated right ventricle, overall normal systolic function  Patient Profile  79 y.o. male admitted with shortness of breath secondary to unprovoked acute saddle pulmonary embolus with discovery of acute left-sided DVT with hyperlipidemia hypertension.  Assessment & Plan  Unprovoked acute saddle pulmonary embolism with acute left femoral vein to distal DVT  -Xarelto 15 mg twice daily for 21 days and then 20 mg daily thereafter.  -He had IVC filter placed because of large left lower extremity DVT with concomitant saddle pulmonary embolus, nonobstructive for fear of migration would cause high morbidity/mortality. Currently tolerating anticoagulation well. Plan would be to retrieve IVC filter within the next 1 to 3 months. Will discuss with Dr. Markus Daft of interventional radiology for coordination of care.  -Thankfully no evidence of significant right heart strain, no hemodynamic instability, no hypoxia. Troponins were normal.  -Symptoms started approximately 4 to 5 weeks ago with shortness of breath, 2 weeks ago noticeable left lower extremity edema. No significant leg pain. He is very active walks 3 miles a day in the neighborhood, gym membership as well. Semiretired orthodontist.  -We will have close follow-up within the next 2 weeks in clinic. Once again will need IVC filter retrieval in 3 weeks. We will need to schedule this with interventional radiology, Dr. Anselm Pancoast or associate. Continue with Xarelto. Given his unprovoked PE/DVT, we will consider potential long-term anticoagulation, for instance after 6 months of full  dose Xarelto treatment, consider 10 mg daily dosing thereafter if he is willing and amenable and his bleeding risk remains low.  Acute DVT left-sided femoral vein to distal  -Anticoagulation as above. IVC filter was placed out of fear of migration leading to high morbidity given his overall PE clot  burden.  -Early ambulation from clinical trials is very reasonable. Continue with daily walking but slowly increase exercise efforts. We will try to refrain from any excessive physical leg work/strength training at this time. We will closely monitor him. Currently his swelling/edema has improved, may be in part from recumbent position in bed. No signs of post thrombotic syndrome.  Essential hypertension  -Continue current medications well-controlled.  Hyperlipidemia  -No change with Crestor.  Would avoid use of sildenafil for the first 1 to 2 months to make sure we do not have any decrease in blood pressure.  1-2 week follow up with me in clinic.  For questions or updates, please contact [place]  Please consult [email or web address] for contact info under  Signed,  Candee Furbish, MD

## 2019-05-10 NOTE — Progress Notes (Signed)
Discharged via wheelchair from [place] to [facility]	[place]. VSS  Pre discharge IV taken out. Right bandage neck without drainage. Discharge instructions given.

## 2019-05-10 NOTE — Plan of Care (Signed)
Completed care plan and adequate for discharge.  Feels good no shortness of breath when ambulating, swelling has dissapated.

## 2019-05-10 NOTE — Progress Notes (Signed)
Progress Note  Patient Name: James Noble Date of Encounter: 05/10/2019  Primary Cardiologist: Candee Furbish, MD   Subjective   Feels good, no shortness of breath when walking several laps around the unit.  No oxygen requirements.  No bleeding.  No chest pain.  Eating well.  Moving bowels.  Inpatient Medications    Scheduled Meds:  amLODipine  10 mg Oral Daily   Chlorhexidine Gluconate Cloth  6 each Topical Daily   Chlorhexidine Gluconate Cloth  6 each Topical Daily   losartan  100 mg Oral Daily   rivaroxaban  15 mg Oral BID   rosuvastatin  20 mg Oral QHS   Continuous Infusions:  PRN Meds: acetaminophen, ondansetron (ZOFRAN) IV   Vital Signs    Vitals:   05/10/19 0421 05/10/19 0500 05/10/19 0600 05/10/19 0700  BP:  134/82 129/87 138/84  Pulse:      Resp:  15 17 15   Temp: 97.8 F (36.6 C)     TempSrc: Oral     SpO2:  93% 91% 93%  Weight:      Height:        Intake/Output Summary (Last 24 hours) at 05/10/2019 0816 Last data filed at 05/09/2019 2100 Gross per 24 hour  Intake 135.97 ml  Output --  Net 135.97 ml   Last 3 Weights 05/07/2019 12/10/2017 10/11/2017  Weight (lbs) 196 lb 185 lb 185 lb  Weight (kg) 88.905 kg 83.915 kg 83.915 kg      Telemetry    Sinus rhythm occasional PVCs, right bundle branch block at baseline- Personally Reviewed  ECG    Right bundle branch block- Personally Reviewed  Physical Exam   GEN: No acute distress.   Neck: No JVD Cardiac: RRR, no murmurs, rubs, or gallops.  Respiratory: Clear to auscultation bilaterally. GI: Soft, nontender, non-distended  MS: No edema; No deformity. Neuro:  Nonfocal  Psych: Normal affect   Labs    High Sensitivity Troponin:   Recent Labs  Lab 05/07/19 1223  TROPONINIHS 16      Chemistry Recent Labs  Lab 05/07/19 1223 05/08/19 0721  NA 138 140  K 4.3 4.1  CL 108 106  CO2 20* 26  GLUCOSE 108* 104*  BUN 11 8  CREATININE 0.83 0.79  CALCIUM 8.8* 9.3  GFRNONAA >60 >60  GFRAA >60  >60  ANIONGAP 10 8     Hematology Recent Labs  Lab 05/08/19 0721 05/09/19 0229 05/10/19 0134  WBC 5.6 5.6 5.0  RBC 4.16* 4.04* 3.89*  HGB 14.5 14.3 13.7  HCT 43.0 41.4 40.4  MCV 103.4* 102.5* 103.9*  MCH 34.9* 35.4* 35.2*  MCHC 33.7 34.5 33.9  RDW 13.2 13.3 13.3  PLT 158 143* 157    BNP Recent Labs  Lab 05/07/19 1223  BNP 91.8     DDimer No results for input(s): DDIMER in the last 168 hours.   Radiology    IR IVC FILTER PLMT / S&I Burke Keels GUID/MOD SED  Result Date: 05/08/2019 INDICATION: 79 year old with a large pulmonary embolism and left lower extremity DVT. Patient is anticoagulated but plan for IVC filter placement due to the large clot burden in the pulmonary arteries. EXAM: IVC FILTER PLACEMENT; IVC VENOGRAM; ULTRASOUND FOR VASCULAR ACCESS Physician: Stephan Minister. Anselm Pancoast, MD MEDICATIONS: None. ANESTHESIA/SEDATION: Fentanyl 50 mcg IV; Versed 1.0 mg IV Moderate Sedation Time:  18 minutes The patient was continuously monitored during the procedure by the interventional radiology nurse under my direct supervision. CONTRAST:  50 mL Omnipaque 300 FLUOROSCOPY  TIME:  Fluoroscopy Time: 2 minutes, 24 seconds, 43 mGy COMPLICATIONS: None immediate. PROCEDURE: The procedure was explained to the patient. The risks and benefits of the procedure were discussed and the patient's questions were addressed. Informed consent was obtained from the patient. Ultrasound demonstrated a patent right internal jugular vein. Ultrasound images were obtained for documentation. The right neck was prepped and draped in a sterile fashion. Maximal barrier sterile technique was utilized including caps, mask, sterile gowns, sterile gloves, sterile drape, hand hygiene and skin antiseptic. The skin was anesthetized with 1% lidocaine. A 21 gauge needle was directed into the vein with ultrasound guidance and a micropuncture dilator set was placed. A wire was advanced into the IVC. The filter sheath was advanced over the wire into  the IVC. An IVC venogram was performed. Bilateral renal veins were cannulated with a 5 French catheter and Bentson wire to confirm location. Fluoroscopic images were obtained for documentation. A Bard Denali filter was deployed below the lowest renal vein. A follow-up venogram was performed and the vascular sheath was removed with manual compression. FINDINGS: IVC was patent. Bilateral renal veins were identified. The filter was deployed below the lowest renal vein. Follow-up venogram confirmed placement within the IVC and below the renal veins. IMPRESSION: Successful placement of a retrievable IVC filter. PLAN: This IVC filter is potentially retrievable. The patient will be assessed for filter retrieval by Interventional Radiology in approximately 8-12 weeks. Further recommendations regarding filter retrieval, continued surveillance or declaration of device permanence, will be made at that time. Electronically Signed   By: Markus Daft M.D.   On: 05/08/2019 17:41   VAS [place] LOWER EXTREMITY VENOUS (DVT)  Result Date: 05/08/2019  Lower Venous Study Indications: Pulmonary embolism.  Risk Factors: Surgery. Anticoagulation: Heparin. Comparison Study: No prior studies. Performing Technologist: Oliver Hum RVT  Examination Guidelines: A complete evaluation includes B-mode imaging, spectral Doppler, color Doppler, and power Doppler as needed of all accessible portions of each vessel. Bilateral testing is considered an integral part of a complete examination. Limited examinations for reoccurring indications may be performed as noted.  +---------+---------------+---------+-----------+----------+--------------+  RIGHT     Compressibility Phasicity Spontaneity Properties Thrombus Aging  +---------+---------------+---------+-----------+----------+--------------+  CFV       Full            Yes       Yes                                    +---------+---------------+---------+-----------+----------+--------------+  SFJ        Full                                                             +---------+---------------+---------+-----------+----------+--------------+  FV Prox   Full                                                             +---------+---------------+---------+-----------+----------+--------------+  FV Mid    Full                                                             +---------+---------------+---------+-----------+----------+--------------+  FV Distal Full                                                             +---------+---------------+---------+-----------+----------+--------------+  PFV       Full                                                             +---------+---------------+---------+-----------+----------+--------------+  POP       Full            Yes       Yes                                    +---------+---------------+---------+-----------+----------+--------------+  PTV       Full                                                             +---------+---------------+---------+-----------+----------+--------------+  PERO      Full                                                             +---------+---------------+---------+-----------+----------+--------------+   +---------+---------------+---------+-----------+----------+--------------+  LEFT      Compressibility Phasicity Spontaneity Properties Thrombus Aging  +---------+---------------+---------+-----------+----------+--------------+  CFV       Full            Yes       Yes                                    +---------+---------------+---------+-----------+----------+--------------+  SFJ       Full                                                             +---------+---------------+---------+-----------+----------+--------------+  FV Prox   Partial         Yes       Yes                    Acute           +---------+---------------+---------+-----------+----------+--------------+  FV Mid    Partial         Yes       Yes                     Acute           +---------+---------------+---------+-----------+----------+--------------+  FV Distal  None            No        No                     Acute           +---------+---------------+---------+-----------+----------+--------------+  POP       Partial         No        No                     Acute           +---------+---------------+---------+-----------+----------+--------------+  PTV       None                                             Acute           +---------+---------------+---------+-----------+----------+--------------+  PERO      None                                             Acute           +---------+---------------+---------+-----------+----------+--------------+     Summary: Right: No evidence of common femoral vein obstruction. Left: Findings consistent with acute deep vein thrombosis involving the left femoral vein, left popliteal vein, left posterior tibial veins, and left peroneal veins. No cystic structure found in the popliteal fossa.  *See table(s) above for measurements and observations. Electronically signed by Monica Martinez MD on 05/08/2019 at 5:12:20 PM.    Final     Cardiac Studies   Echo with left ventricular ejection fraction, mildly dilated right ventricle, overall normal systolic function  Patient Profile     79 y.o. male admitted with shortness of breath secondary to unprovoked acute saddle pulmonary embolus with discovery of acute left-sided DVT with hyperlipidemia hypertension.  Assessment & Plan    Unprovoked acute saddle pulmonary embolism with acute left femoral vein to distal DVT -Xarelto 15 mg twice daily for 21 days and then 20 mg daily thereafter. -He had IVC filter placed because of large left lower extremity DVT with concomitant saddle pulmonary embolus, nonobstructive for fear of migration would cause high morbidity/mortality.  Currently tolerating anticoagulation well.  Plan would be to retrieve IVC filter within the next 1 to 3 months.   Will discuss with Dr. Markus Daft of interventional radiology for coordination of care. -Thankfully no evidence of significant right heart strain, no hemodynamic instability, no hypoxia.  Troponins were normal. -Symptoms started approximately 4 to 5 weeks ago with shortness of breath, 2 weeks ago noticeable left lower extremity edema.  No significant leg pain.  He is very active walks 3 miles a day in the neighborhood, gym membership as well.  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  Once again will need IVC filter retrieval in 3 weeks. We will need to schedule this with interventional radiology, Dr. Anselm Pancoast or associate. Continue with Xarelto.  Given his unprovoked PE/DVT, we will consider potential long-term anticoagulation, for instance after 6 months of full dose Xarelto treatment, consider 10 mg daily dosing thereafter if he is willing and amenable and his bleeding risk remains low.  Acute DVT left-sided  femoral vein to distal -Anticoagulation as above.  IVC filter was placed out of fear of migration leading to high morbidity given his overall PE clot burden. -Early ambulation from clinical trials is very reasonable.  Continue with daily walking but slowly increase exercise efforts.  We will try to refrain from any excessive physical leg work/strength training at this time.  We will closely monitor him.  Currently his swelling/edema has improved, may be in part from recumbent position in bed.  No signs of post thrombotic syndrome.  Essential hypertension -Continue current medications well-controlled.  Hyperlipidemia -No change with Crestor.  Would avoid use of sildenafil for the first 1 to 2 months to make sure we do not have any decrease in blood pressure.  1-2 week follow up with me in clinic.    For questions or updates, please contact [place] Please consult [email or web address] for contact info under        Signed, Candee Furbish, MD   05/10/2019, 8:16 AM

## 2019-05-10 NOTE — Telephone Encounter (Signed)
    FYI, patient being discharged today - he will need TOC call. I sent message to scheduling team to arrange 1-2 week f/u specifically with Dr. Marlou Porch per his request. Melina Copa PA-C

## 2019-05-12 NOTE — Telephone Encounter (Signed)
Patient contacted regarding discharge from [place] on 05/10/19.  Patient understands to follow up with provider DR Candee Furbish on 05/16/19 at 2:20 PM at [facility]. Patient understands discharge instructions? YES Patient understands medications and regiment? YES Patient understands to bring all medications to this visit? YES

## 2019-05-15 DIAGNOSIS — Z125 Encounter for screening for malignant neoplasm of prostate: Secondary | ICD-10-CM | POA: Diagnosis not present

## 2019-05-15 DIAGNOSIS — I82412 Acute embolism and thrombosis of left femoral vein: Secondary | ICD-10-CM | POA: Diagnosis not present

## 2019-05-15 DIAGNOSIS — Z86010 Personal history of colonic polyps: Secondary | ICD-10-CM | POA: Diagnosis not present

## 2019-05-15 DIAGNOSIS — I2692 Saddle embolus of pulmonary artery without acute cor pulmonale: Secondary | ICD-10-CM | POA: Diagnosis not present

## 2019-05-16 ENCOUNTER — Ambulatory Visit (INDEPENDENT_AMBULATORY_CARE_PROVIDER_SITE_OTHER): Payer: Medicare Other | Admitting: Cardiology

## 2019-05-16 ENCOUNTER — Other Ambulatory Visit: Payer: Self-pay

## 2019-05-16 ENCOUNTER — Encounter: Payer: Self-pay | Admitting: Cardiology

## 2019-05-16 VITALS — BP 144/86 | HR 72 | Ht 71.0 in | Wt 193.0 lb

## 2019-05-16 DIAGNOSIS — I2692 Saddle embolus of pulmonary artery without acute cor pulmonale: Secondary | ICD-10-CM | POA: Diagnosis not present

## 2019-05-16 DIAGNOSIS — I82402 Acute embolism and thrombosis of unspecified deep veins of left lower extremity: Secondary | ICD-10-CM

## 2019-05-16 DIAGNOSIS — Z95828 Presence of other vascular implants and grafts: Secondary | ICD-10-CM | POA: Diagnosis not present

## 2019-05-16 NOTE — Patient Instructions (Signed)
Medication Instructions:  The current medical regimen is effective;  continue present plan and medications.  Please make sure to start Xarelto 20 mg daily once you finish your starter pack.  *If you need a refill on your cardiac medications before your next appointment, please call your pharmacy*  Testing/Procedures: You will be contacted about scheduling to have your IVC filter removed.    Follow-Up: At [facility], you and your health needs are our priority.  As part of our continuing mission to provide you with exceptional heart care, we have created designated Provider Care Teams.  These Care Teams include your primary Cardiologist (physician) and Advanced Practice Providers (APPs -  Physician Assistants and Nurse Practitioners) who all work together to provide you with the care you need, when you need it.  Your next appointment:   3 month(s)  The format for your next appointment:   In Person  Provider:   Candee Furbish, MD  Thank you for choosing [facility]!!

## 2019-05-16 NOTE — Addendum Note (Signed)
Addended by: Shellia Cleverly on: 05/16/2019 04:25 PM   Modules accepted: Orders

## 2019-05-16 NOTE — Progress Notes (Signed)
Cardiology Office Note:    Date:  05/16/2019   ID:  James Noble, DOB 03/25/1941, MRN [id number]  PCP:  Lavone Orn, MD  Cardiologist:  Candee Furbish, MD  Electrophysiologist:  None   Referring MD: Lavone Orn, MD     History of Present Illness:    James Noble is a 79 y.o. male here for follow up visit of acute saddle pulmonary embolism, left lower extremity DVT hospitalized on 05/07/2019. Dr. Laurann Montana saw with labs.   Initially treated with IV heparin.  Interventional radiology critical care medicine, Dr. Haroldine Laws all discussed therapy.  Since nonocclusive, no evidence of right heart strain, IV heparin was utilized with transition to Xarelto.  An IVC filter was placed after left femoral vein to distal DVT discovered.  Given the large nature of the DVT and concurrent saddle emboli, could not afford potential migration on early treatment.  He did well.  Eventually became less short of breath.  Traverse the hallways of the hospital well.  Echocardiogram performed showed mildly dilated right ventricle but overall normal systolic function and normal left ventricular function.  Past Medical History:  Diagnosis Date  . Cancer (HCC)    BASAL CELL , DR.LOMAX  . DVT (deep venous thrombosis) ([place])    a. L sided DVT 05/2019, s/p IVC filter 05/08/19.  Marland Kitchen Hiatal hernia   . Hx of colonic polyps    DR.MEDOFF  . Hyperlipidemia    BASED ON NMR;GILBERTS SYNDROME  . Hyperuricemia without signs inflammatory arthritis/tophaceous disease 2012   uric acid 7.7  . Saddle pulmonary embolus ([place]) 05/2019  . Sciatica   . Spinal stenosis    OF LS SPINE...DR.LOVE    Past Surgical History:  Procedure Laterality Date  . colonoscopy with polypectomy  2005 & 2008   Dr Earlean Shawl  . [place]  . INGUINAL HERNIA REPAIR  1968  . IR IVC FILTER PLMT / S&I /IMG GUID/MOD SED  05/08/2019  . steroid spinal injections  2013   X2, Dr Ernestina Patches  . TONSILLECTOMY AND ADENOIDECTOMY  1949  . UPPER  GASTROINTESTINAL ENDOSCOPY  2008   Dr Earlean Shawl; Hiatal hernia  . VASECTOMY      Current Medications: Current Meds  Medication Sig  . amLODipine (NORVASC) 5 MG tablet Take 2 tablets (10 mg total) by mouth daily. Overdue for annual appt w/labs must see provider for refills  . Apoaequorin (PREVAGEN PO) Take 1 capsule by mouth daily.  . Ergocalciferol (VITAMIN D2) 400 units TABS Take 1 tablet by mouth daily.  . fluticasone (FLONASE) 50 MCG/ACT nasal spray Place 2 sprays into both nostrils at bedtime.  Marland Kitchen guaiFENesin (MUCINEX) 600 MG 12 hr tablet Take 600 mg by mouth daily.  Marland Kitchen guaiFENesin (ROBITUSSIN) 100 MG/5ML liquid Take 100 mg by mouth at bedtime.  Marland Kitchen losartan (COZAAR) 100 MG tablet Take 1 tablet (100 mg total) by mouth daily. Must keep August appt for future refills  . Multiple Vitamin (MULTI-VITAMINS) TABS Take 1 tablet by mouth daily.  . Probiotic Product (PROBIOTIC ACIDOPHILUS BEADS PO) Take 1 capsule by mouth daily.  . Rivaroxaban (XARELTO STARTER PACK) 15 & 20 MG TBPK Follow package directions: Take one 15mg  tablet by mouth twice a day. On day 22, switch to one 20mg  tablet once a day. Take with food.  . Rivaroxaban (XARELTO) 20 MG TABS tablet Take 1 tablet (20 mg total) by mouth daily. You will start this AFTER you complete your starter pack.  . rosuvastatin (CRESTOR) 20 MG tablet  TAKE ONE TABLET AT BEDTIME (Patient taking differently: Take 20 mg by mouth at bedtime. )  . vitamin B-12 (CYANOCOBALAMIN) 1000 MCG tablet Take 1,000 mcg by mouth daily.     Allergies:   Dexlansoprazole and Nexium [esomeprazole magnesium]   Social History   Socioeconomic History  . Marital status: Married    Spouse name: Not on file  . Number of children: Not on file  . Years of education: Not on file  . Highest education level: Not on file  Occupational History  . Occupation: ORTHODONTIST  Tobacco Use  . Smoking status: Former Smoker    Quit date: 05/01/1968    Years since quitting: 51.0  . Smokeless  tobacco: Never Used  . Tobacco comment: smoked 1960-1970, up to 1 ppd  Substance and Sexual Activity  . Alcohol use: Yes    Alcohol/week: 14.0 standard drinks    Types: 14 Glasses of wine per week    Comment:  socially  . Drug use: No  . Sexual activity: Not on file  Other Topics Concern  . Not on file  Social History Narrative   HEART HEALTHY DIET   EXERCISES 4-6 TIME WEEKLY   Social Determinants of Health   Financial Resource Strain:   . Difficulty of Paying Living Expenses: Not on file  Food Insecurity:   . Worried About Charity fundraiser in the Last Year: Not on file  . Ran Out of Food in the Last Year: Not on file  Transportation Needs:   . Lack of Transportation (Medical): Not on file  . Lack of Transportation (Non-Medical): Not on file  Physical Activity:   . Days of Exercise per Week: Not on file  . Minutes of Exercise per Session: Not on file  Stress:   . Feeling of Stress : Not on file  Social Connections:   . Frequency of Communication with Friends and Family: Not on file  . Frequency of Social Gatherings with Friends and Family: Not on file  . Attends Religious Services: Not on file  . Active Member of Clubs or Organizations: Not on file  . Attends Archivist Meetings: Not on file  . Marital Status: Not on file     Family History: The patient's family history includes Heart attack (age of onset: 85) in his father; Stroke in his maternal grandfather, maternal grandmother, and maternal uncle; Stroke (age of onset: 3) in his mother. There is no history of Diabetes.  ROS:   Please see the history of present illness.    No bleeding no fevers no chills all other systems reviewed and are negative.  EKGs/Labs/Other Studies Reviewed:    The following studies were reviewed today: Prior CT scan echocardiogram hospital records reviewed  EKG:  EKG is not ordered today.    Recent Labs: 05/07/2019: B Natriuretic Peptide 91.8 05/08/2019: BUN 8; Creatinine,  Ser 0.79; Potassium 4.1; Sodium 140 05/10/2019: Hemoglobin 13.7; Platelets 157  Recent Lipid Panel    Component Value Date/Time   CHOL 134 01/13/2016 0738   CHOL 138 10/22/2014 0737   TRIG 78.0 01/13/2016 0738   TRIG 115 10/22/2014 0737   TRIG 98 03/19/2006 0000   HDL 38.50 (L) 01/13/2016 0738   HDL 40 10/22/2014 0737   CHOLHDL 3 01/13/2016 0738   VLDL 15.6 01/13/2016 0738   LDLCALC 80 01/13/2016 0738   LDLCALC 75 10/22/2014 0737    Physical Exam:    VS:  BP (!) 144/86   Pulse 72  Ht 5\' 11"  (1.803 m)   Wt 193 lb (87.5 kg)   SpO2 98%   BMI 26.92 kg/m     Wt Readings from Last 3 Encounters:  05/16/19 193 lb (87.5 kg)  05/07/19 196 lb (88.9 kg)  12/10/17 185 lb (83.9 kg)     GEN:  Well nourished, well developed in no acute distress HEENT: Normal NECK: No JVD; No carotid bruits LYMPHATICS: No lymphadenopathy CARDIAC: RRR, no murmurs, rubs, gallops RESPIRATORY:  Clear to auscultation without rales, wheezing or rhonchi  ABDOMEN: Soft, non-tender, non-distended MUSCULOSKELETAL: 2+ left lower extremity edema; No deformity  SKIN: Warm and dry NEUROLOGIC:  Alert and oriented x 3 PSYCHIATRIC:  Normal affect   ASSESSMENT:    1. Acute saddle pulmonary embolism without acute cor pulmonale (HCC)   2. Acute deep vein thrombosis (DVT) of left lower extremity, unspecified vein (HCC)    PLAN:    In order of problems listed above:  Saddle pulmonary embolism acute bilateral -No evidence of right heart strain -Xarelto starter pack has been given, continue with Xarelto 20 mg for perpetuity at this point. -Unprovoked PE/DVT. -Also suggested that he potentially have his screening colonoscopy perhaps a year earlier with Dr. Earlean Shawl at least after 6 months of full treatment for PE DVT.  IVC filter -This will need to be removed approximately 3 weeks after insertion on 05/08/2019 by Dr. Markus Daft of interventional radiology.  We will go ahead and coordinate the scheduling of  this.  Chronic anticoagulation -Xarelto.  Plan 15 mg twice daily for 21 full days then 20 mg thereafter.  A prescription has been sent to his pharmacy.  He could utilize the 30-day free card.  I made sure that he was aware that he needs to take this medication for perpetuity at this point.  His bleeding risk at this time is low.  Dr. Laurann Montana has checked labs.  Left lower extremity DVT, acute -2+ lower extremity edema noted.  Support hose.  Elevate.  Appear to improve in the hospital however he was laying down much of that time.  This encompasses the femoral vein distally.  Does not have any iliac or common femoral involvement.  Essential hypertension -Mildly elevated today.  Continue to monitor on current medications  Hyperlipidemia -On Crestor 20 mg.  Continue.  Family history of early CAD -Prevention efforts.  Prior exercise treadmill test.  Reassuring.  3 month follow-up   Medication Adjustments/Labs and Tests Ordered: Current medicines are reviewed at length with the patient today.  Concerns regarding medicines are outlined above.  No orders of the defined types were placed in this encounter.  No orders of the defined types were placed in this encounter.   Patient Instructions  Medication Instructions:  The current medical regimen is effective;  continue present plan and medications.  Please make sure to start Xarelto 20 mg daily once you finish your starter pack.  *If you need a refill on your cardiac medications before your next appointment, please call your pharmacy*  Testing/Procedures: You will be contacted about scheduling to have your IVC filter removed.    Follow-Up: At [facility], you and your health needs are our priority.  As part of our continuing mission to provide you with exceptional heart care, we have created designated Provider Care Teams.  These Care Teams include your primary Cardiologist (physician) and Advanced Practice Providers (APPs -  Physician  Assistants and Nurse Practitioners) who all work together to provide you with the care you need,  when you need it.  Your next appointment:   3 month(s)  The format for your next appointment:   In Person  Provider:   Candee Furbish, MD  Thank you for choosing [facility]!!        Signed, Candee Furbish, MD  05/16/2019 3:31 PM    [place]

## 2019-05-20 ENCOUNTER — Other Ambulatory Visit: Payer: Self-pay | Admitting: Cardiology

## 2019-05-20 ENCOUNTER — Other Ambulatory Visit: Payer: Self-pay | Admitting: Diagnostic Radiology

## 2019-05-20 DIAGNOSIS — Z86718 Personal history of other venous thrombosis and embolism: Secondary | ICD-10-CM

## 2019-05-21 ENCOUNTER — Encounter: Payer: Self-pay | Admitting: *Deleted

## 2019-05-21 ENCOUNTER — Ambulatory Visit
Admission: RE | Admit: 2019-05-21 | Discharge: 2019-05-21 | Disposition: A | Discharge status: Home / Self Care | Payer: Medicare Other | Source: Ambulatory Visit | Attending: Cardiology | Admitting: Cardiology

## 2019-05-21 ENCOUNTER — Other Ambulatory Visit: Payer: Self-pay

## 2019-05-21 DIAGNOSIS — Z86718 Personal history of other venous thrombosis and embolism: Secondary | ICD-10-CM

## 2019-05-21 DIAGNOSIS — I2699 Other pulmonary embolism without acute cor pulmonale: Secondary | ICD-10-CM | POA: Diagnosis not present

## 2019-05-21 HISTORY — PX: IR RADIOLOGIST EVAL & MGMT: IMG5224

## 2019-05-21 NOTE — Progress Notes (Signed)
Chief Complaint: Patient was consulted remotely today (TeleHealth) for IVC filter management at the request of Skains,Mark C.    Referring Physician(s): Skains,Mark C  History of Present Illness: James Noble is a 79 y.o. male with history of acute saddle pulmonary embolism without acute cor pulmonale.  Patient was hospitalized with acute saddle pulmonary embolism and left lower extremity DVT on 05/07/2019.  Patient was treated with IV heparin and an IVC filter was placed due to the large clot burden in the pulmonary arteries and concern that he would not tolerate any additional pulmonary insults.  Patient did well in the hospital and his breathing improved.  Patient has transitioned to Xarelto and he is doing well at home.  I spoke to the patient and his wife on the telephone.  He has been wearing a compression stocking and the left leg swelling is getting better and there is improved color in the left lower extremity.  Patient is breathing much better than he was in the hospital.  He is walking regularly with his wife and slowly increasing the walking distance.  Past Medical History:  Diagnosis Date  . Cancer (HCC)    BASAL CELL , DR.LOMAX  . DVT (deep venous thrombosis) ([place])    a. L sided DVT 05/2019, s/p IVC filter 05/08/19.  Marland Kitchen Hiatal hernia   . Hx of colonic polyps    DR.MEDOFF  . Hyperlipidemia    BASED ON NMR;GILBERTS SYNDROME  . Hyperuricemia without signs inflammatory arthritis/tophaceous disease 2012   uric acid 7.7  . Saddle pulmonary embolus ([place]) 05/2019  . Sciatica   . Spinal stenosis    OF LS SPINE...DR.LOVE    Past Surgical History:  Procedure Laterality Date  . colonoscopy with polypectomy  2005 & 2008   Dr Earlean Shawl  . [place]  . INGUINAL HERNIA REPAIR  1968  . IR IVC FILTER PLMT / S&I /IMG GUID/MOD SED  05/08/2019  . steroid spinal injections  2013   X2, Dr Ernestina Patches  . TONSILLECTOMY AND ADENOIDECTOMY  1949  . UPPER GASTROINTESTINAL ENDOSCOPY   2008   Dr Earlean Shawl; Hiatal hernia  . VASECTOMY      Allergies: Dexlansoprazole and Nexium [esomeprazole magnesium]  Medications: Prior to Admission medications   Medication Sig Start Date End Date Taking? Authorizing Provider  amLODipine (NORVASC) 5 MG tablet Take 2 tablets (10 mg total) by mouth daily. Overdue for annual appt w/labs must see provider for refills 11/14/16   Binnie Rail, MD  Apoaequorin (PREVAGEN PO) Take 1 capsule by mouth daily.    [provider]  Ergocalciferol (VITAMIN D2) 400 units TABS Take 1 tablet by mouth daily.    [provider]  fluticasone (FLONASE) 50 MCG/ACT nasal spray Place 2 sprays into both nostrils at bedtime.    [provider]  guaiFENesin (MUCINEX) 600 MG 12 hr tablet Take 600 mg by mouth daily.    [provider]  guaiFENesin (ROBITUSSIN) 100 MG/5ML liquid Take 100 mg by mouth at bedtime.    [provider]  losartan (COZAAR) 100 MG tablet Take 1 tablet (100 mg total) by mouth daily. Must keep August appt for future refills 11/07/16   Binnie Rail, MD  Multiple Vitamin (MULTI-VITAMINS) TABS Take 1 tablet by mouth daily.    [provider]  Probiotic Product (PROBIOTIC ACIDOPHILUS BEADS PO) Take 1 capsule by mouth daily.    [provider]  Rivaroxaban (XARELTO STARTER PACK) 15 & 20 MG  TBPK Follow package directions: Take one 15mg  tablet by mouth twice a day. On day 22, switch to one 20mg  tablet once a day. Take with food. 05/10/19   Dunn, Nedra Hai, PA-C  Rivaroxaban (XARELTO) 20 MG TABS tablet Take 1 tablet (20 mg total) by mouth daily. You will start this AFTER you complete your starter pack. 05/10/19   Dunn, Nedra Hai, PA-C  rosuvastatin (CRESTOR) 20 MG tablet TAKE ONE TABLET AT BEDTIME Patient taking differently: Take 20 mg by mouth at bedtime.  09/04/16   Binnie Rail, MD  vitamin B-12 (CYANOCOBALAMIN) 1000 MCG tablet Take 1,000 mcg by mouth daily.    [provider]     Family  History  Problem Relation Age of Onset  . Heart attack Father 53        RHD, smoker  . Stroke Maternal Grandmother        late 15s  . Stroke Mother 80       smoker  . Stroke Maternal Uncle        X 3  . Stroke Maternal Grandfather   . Diabetes Neg Hx     Social History   Socioeconomic History  . Marital status: Married    Spouse name: Not on file  . Number of children: Not on file  . Years of education: Not on file  . Highest education level: Not on file  Occupational History  . Occupation: ORTHODONTIST  Tobacco Use  . Smoking status: Former Smoker    Quit date: 05/01/1968    Years since quitting: 51.0  . Smokeless tobacco: Never Used  . Tobacco comment: smoked 1960-1970, up to 1 ppd  Substance and Sexual Activity  . Alcohol use: Yes    Alcohol/week: 14.0 standard drinks    Types: 14 Glasses of wine per week    Comment:  socially  . Drug use: No  . Sexual activity: Not on file  Other Topics Concern  . Not on file  Social History Narrative   HEART HEALTHY DIET   EXERCISES 4-6 TIME WEEKLY   Social Determinants of Health   Financial Resource Strain:   . Difficulty of Paying Living Expenses: Not on file  Food Insecurity:   . Worried About Charity fundraiser in the Last Year: Not on file  . Ran Out of Food in the Last Year: Not on file  Transportation Needs:   . Lack of Transportation (Medical): Not on file  . Lack of Transportation (Non-Medical): Not on file  Physical Activity:   . Days of Exercise per Week: Not on file  . Minutes of Exercise per Session: Not on file  Stress:   . Feeling of Stress : Not on file  Social Connections:   . Frequency of Communication with Friends and Family: Not on file  . Frequency of Social Gatherings with Friends and Family: Not on file  . Attends Religious Services: Not on file  . Active Member of Clubs or Organizations: Not on file  . Attends Archivist Meetings: Not on file  . Marital Status: Not on file      Review of Systems  Respiratory: Positive for shortness of breath.   Cardiovascular: Positive for leg swelling.   Vital Signs: There were no vitals taken for this visit.  Imaging: DG Chest 2 View  Result Date: 05/06/2019 CLINICAL DATA:  Dyspnea on exertion for several weeks EXAM: CHEST - 2 VIEW COMPARISON:  03/12/2014 FINDINGS: The heart size and mediastinal contours  are within normal limits. Both lungs are clear. The visualized skeletal structures are unremarkable. IMPRESSION: No active cardiopulmonary disease. Electronically Signed   By: Inez Catalina M.D.   On: 05/06/2019 15:23   CT ANGIO CHEST PE W OR WO CONTRAST  Result Date: 05/07/2019 CLINICAL DATA:  Shortness of breath EXAM: CT ANGIOGRAPHY CHEST WITH CONTRAST TECHNIQUE: Multidetector CT imaging of the chest was performed using the standard protocol during bolus administration of intravenous contrast. Multiplanar CT image reconstructions and MIPs were obtained to evaluate the vascular anatomy. CONTRAST:  29mL ISOVUE-370 IOPAMIDOL (ISOVUE-370) INJECTION 76% COMPARISON:  Chest radiograph May 06, 2019 FINDINGS: Cardiovascular: There is extensive pulmonary embolus, arising in both main pulmonary arteries in a saddle type distribution with extension of pulmonary emboli into multiple upper and lower lobe pulmonary artery branches bilaterally. The right ventricle to left ventricle diameter ratio is less than 0.9, not indicative of right heart strain. There is no thoracic aortic aneurysm or dissection. Visualized great vessels demonstrate occasional mild foci of calcification. Note that the right innominate and left common carotid arteries arise as a common trunk, an anatomic variant. There is aortic atherosclerosis as well as foci of coronary artery calcification. There is no pericardial effusion or pericardial thickening. Mediastinum/Nodes: Thyroid appears unremarkable. There is no appreciable thoracic adenopathy by size criteria. There are  occasional subcentimeter mediastinal lymph nodes which do not meet size criteria for pathologic significance. There is a small hiatal hernia. Lungs/Pleura: There are scattered areas of atelectatic change. No evident consolidation or pulmonary infarct. No pleural effusions are evident. Upper Abdomen: There is upper abdominal aortic atherosclerosis. Visualized upper abdominal structures otherwise appear unremarkable. Musculoskeletal: There are foci of degenerative change in the thoracic spine. No blastic or lytic bone lesions are evident. There are no chest wall lesions. Review of the MIP images confirms the above findings. IMPRESSION: 1. Extensive bilateral pulmonary emboli. Pulmonary emboli arise from both main pulmonary outflow tracks in a saddle type distribution. Right ventricle to left ventricular diameter is less than 0.9, not indicative of right heart strain. 2. No thoracic aortic aneurysm or dissection. There is aortic atherosclerosis as well as foci of coronary artery calcification. 3. Areas of scattered atelectasis. No edema or consolidation. No evident pulmonary infarct. 4.  Small hiatal hernia. 5.  No adenopathy. Critical Value/emergent results were called by telephone at the time of interpretation on 05/07/2019 at 11:03 am to providerJOHN GRIFFIN , who verbally acknowledged these results. Aortic Atherosclerosis (ICD10-I70.0). Electronically Signed   By: Lowella Grip III M.D.   On: 05/07/2019 11:03   IR IVC FILTER PLMT / S&I Burke Keels GUID/MOD SED  Result Date: 05/08/2019 INDICATION: 79 year old with a large pulmonary embolism and left lower extremity DVT. Patient is anticoagulated but plan for IVC filter placement due to the large clot burden in the pulmonary arteries. EXAM: IVC FILTER PLACEMENT; IVC VENOGRAM; ULTRASOUND FOR VASCULAR ACCESS Physician: Stephan Minister. Anselm Pancoast, MD MEDICATIONS: None. ANESTHESIA/SEDATION: Fentanyl 50 mcg IV; Versed 1.0 mg IV Moderate Sedation Time:  18 minutes The patient was  continuously monitored during the procedure by the interventional radiology nurse under my direct supervision. CONTRAST:  50 mL Omnipaque 300 FLUOROSCOPY TIME:  Fluoroscopy Time: 2 minutes, 24 seconds, 43 mGy COMPLICATIONS: None immediate. PROCEDURE: The procedure was explained to the patient. The risks and benefits of the procedure were discussed and the patient's questions were addressed. Informed consent was obtained from the patient. Ultrasound demonstrated a patent right internal jugular vein. Ultrasound images were obtained for documentation. The right neck  was prepped and draped in a sterile fashion. Maximal barrier sterile technique was utilized including caps, mask, sterile gowns, sterile gloves, sterile drape, hand hygiene and skin antiseptic. The skin was anesthetized with 1% lidocaine. A 21 gauge needle was directed into the vein with ultrasound guidance and a micropuncture dilator set was placed. A wire was advanced into the IVC. The filter sheath was advanced over the wire into the IVC. An IVC venogram was performed. Bilateral renal veins were cannulated with a 5 French catheter and Bentson wire to confirm location. Fluoroscopic images were obtained for documentation. A Bard Denali filter was deployed below the lowest renal vein. A follow-up venogram was performed and the vascular sheath was removed with manual compression. FINDINGS: IVC was patent. Bilateral renal veins were identified. The filter was deployed below the lowest renal vein. Follow-up venogram confirmed placement within the IVC and below the renal veins. IMPRESSION: Successful placement of a retrievable IVC filter. PLAN: This IVC filter is potentially retrievable. The patient will be assessed for filter retrieval by Interventional Radiology in approximately 8-12 weeks. Further recommendations regarding filter retrieval, continued surveillance or declaration of device permanence, will be made at that time. Electronically Signed   By: Markus Daft M.D.   On: 05/08/2019 17:41   ECHOCARDIOGRAM COMPLETE  Result Date: 05/07/2019   ECHOCARDIOGRAM REPORT   Patient Name:   James Noble Date of Exam: 05/07/2019 Medical Rec #:  [id number]     Height:       71.0 in Accession #:    [id number]    Weight:       185.0 lb Date of Birth:  1940-09-18      BSA:          2.04 m Patient Age:    85 years      BP:           146/83 mmHg Patient Gender: M             HR:           75 bpm. Exam Location:  Inpatient Procedure: 2D Echo, Cardiac Doppler and Color Doppler Indications:    R06.02 SOB; I26.02 Pulmonary embolus  History:        Patient has no prior history of Echocardiogram examinations.                 Arrythmias:RBBB, Signs/Symptoms:Dyspnea; Risk                 Factors:Hypertension and Dyslipidemia.  Sonographer:    Roseanna Rainbow RDCS Referring Phys: [id number] MARK C SKAINS  Sonographer Comments: Suboptimal subcostal window. IMPRESSIONS  1. Left ventricular ejection fraction, by visual estimation, is 55 to 60%. The left ventricle has normal function. There is moderately increased left ventricular hypertrophy.  2. Moderate asymmetric hypertrophy measuring 37mm in basal septum (60mm in posterior wall)  3. Global right ventricle has normal systolic function.The right ventricular size is mildly enlarged.  4. The mitral valve is normal in structure. No evidence of mitral valve regurgitation.  5. The aortic valve is tricuspid. Aortic valve regurgitation is trivial. Mild aortic valve sclerosis without stenosis.  6. The tricuspid valve is normal in structure.  7. The pulmonic valve was not well visualized. Pulmonic valve regurgitation is trivial.  8. Left atrial size was normal.  9. Right atrial size was normal. 10. The inferior vena cava is normal in size with greater than 50% respiratory variability, suggesting right atrial pressure of 3 mmHg. 11. TR signal  is inadequate for assessing pulmonary artery systolic pressure. 12. Thrombus seen at the bifurcation of the pulmonary  arteries. FINDINGS  Left Ventricle: Left ventricular ejection fraction, by visual estimation, is 55 to 60%. The left ventricle has normal function. The left ventricle has no regional wall motion abnormalities. The left ventricular internal cavity size was the left ventricle is normal in size. There is moderately increased left ventricular hypertrophy. Asymmetric left ventricular hypertrophy. Left ventricular diastolic parameters were normal. Right Ventricle: The right ventricular size is mildly enlarged. No increase in right ventricular wall thickness. Global RV systolic function is has normal systolic function. Left Atrium: Left atrial size was normal in size. Right Atrium: Right atrial size was normal in size Pericardium: Trivial pericardial effusion is present. Mitral Valve: The mitral valve is normal in structure. No evidence of mitral valve regurgitation. Tricuspid Valve: The tricuspid valve is normal in structure. Tricuspid valve regurgitation is trivial. Aortic Valve: The aortic valve is tricuspid. Aortic valve regurgitation is trivial. Mild aortic valve sclerosis is present, with no evidence of aortic valve stenosis. Pulmonic Valve: The pulmonic valve was not well visualized. Pulmonic valve regurgitation is trivial. Pulmonic regurgitation is trivial. Aorta: The aortic root is normal in size and structure. Pulmonary Artery: Suspect thromboembolism in the main pulmonary artery. Venous: The inferior vena cava is normal in size with greater than 50% respiratory variability, suggesting right atrial pressure of 3 mmHg. IAS/Shunts: The interatrial septum was not well visualized.  LEFT VENTRICLE PLAX 2D LVIDd:         4.00 cm       Diastology LVIDs:         3.20 cm       LV e' lateral:   5.33 cm/s LV PW:         1.20 cm       LV E/e' lateral: 8.6 LV IVS:        1.70 cm       LV e' medial:    5.00 cm/s LVOT diam:     2.00 cm       LV E/e' medial:  9.2 LV SV:         29 ml LV SV Index:   14.10 LVOT Area:     3.14 cm   LV Volumes (MOD) LV area d, A2C:    31.90 cm LV area d, A4C:    31.00 cm LV area s, A2C:    18.20 cm LV area s, A4C:    18.60 cm LV major d, A2C:   8.94 cm LV major d, A4C:   8.71 cm LV major s, A2C:   7.14 cm LV major s, A4C:   7.54 cm LV vol d, MOD A2C: 92.8 ml LV vol d, MOD A4C: 89.7 ml LV vol s, MOD A2C: 38.1 ml LV vol s, MOD A4C: 38.5 ml LV SV MOD A2C:     54.7 ml LV SV MOD A4C:     89.7 ml LV SV MOD BP:      53.1 ml RIGHT VENTRICLE             IVC RV S prime:     15.00 cm/s  IVC diam: 1.30 cm TAPSE (M-mode): 2.7 cm LEFT ATRIUM             Index       RIGHT ATRIUM           Index LA diam:        3.70 cm 1.81  cm/m  RA Area:     18.30 cm LA Vol (A2C):   42.2 ml 20.69 ml/m RA Volume:   51.90 ml  25.44 ml/m LA Vol (A4C):   37.7 ml 18.48 ml/m LA Biplane Vol: 41.8 ml 20.49 ml/m  AORTIC VALVE             PULMONIC VALVE LVOT Vmax:   107.00 cm/s PR End Diast Vel: 2.09 msec LVOT Vmean:  71.400 cm/s LVOT VTI:    0.206 m  AORTA Ao Root diam: 3.55 cm MITRAL VALVE MV Area (PHT): 2.62 cm             SHUNTS MV PHT:        83.81 msec           Systemic VTI:  0.21 m MV Decel Time: 289 msec             Systemic Diam: 2.00 cm MV E velocity: 46.00 cm/s 103 cm/s MV A velocity: 78.30 cm/s 70.3 cm/s MV E/A ratio:  0.59       1.5  Oswaldo Milian MD Electronically signed by Oswaldo Milian MD Signature Date/Time: 05/07/2019/9:38:08 PM    Final    VAS [place] LOWER EXTREMITY VENOUS (DVT)  Result Date: 05/08/2019  Lower Venous Study Indications: Pulmonary embolism.  Risk Factors: Surgery. Anticoagulation: Heparin. Comparison Study: No prior studies. Performing Technologist: Oliver Hum RVT  Examination Guidelines: A complete evaluation includes B-mode imaging, spectral Doppler, color Doppler, and power Doppler as needed of all accessible portions of each vessel. Bilateral testing is considered an integral part of a complete examination. Limited examinations for reoccurring indications may be performed as noted.   +---------+---------------+---------+-----------+----------+--------------+ RIGHT    CompressibilityPhasicitySpontaneityPropertiesThrombus Aging +---------+---------------+---------+-----------+----------+--------------+ CFV      Full           Yes      Yes                                 +---------+---------------+---------+-----------+----------+--------------+ SFJ      Full                                                        +---------+---------------+---------+-----------+----------+--------------+ FV Prox  Full                                                        +---------+---------------+---------+-----------+----------+--------------+ FV Mid   Full                                                        +---------+---------------+---------+-----------+----------+--------------+ FV DistalFull                                                        +---------+---------------+---------+-----------+----------+--------------+ PFV  Full                                                        +---------+---------------+---------+-----------+----------+--------------+ POP      Full           Yes      Yes                                 +---------+---------------+---------+-----------+----------+--------------+ PTV      Full                                                        +---------+---------------+---------+-----------+----------+--------------+ PERO     Full                                                        +---------+---------------+---------+-----------+----------+--------------+   +---------+---------------+---------+-----------+----------+--------------+ LEFT     CompressibilityPhasicitySpontaneityPropertiesThrombus Aging +---------+---------------+---------+-----------+----------+--------------+ CFV      Full           Yes      Yes                                  +---------+---------------+---------+-----------+----------+--------------+ SFJ      Full                                                        +---------+---------------+---------+-----------+----------+--------------+ FV Prox  Partial        Yes      Yes                  Acute          +---------+---------------+---------+-----------+----------+--------------+ FV Mid   Partial        Yes      Yes                  Acute          +---------+---------------+---------+-----------+----------+--------------+ FV DistalNone           No       No                   Acute          +---------+---------------+---------+-----------+----------+--------------+ POP      Partial        No       No                   Acute          +---------+---------------+---------+-----------+----------+--------------+ PTV      None  Acute          +---------+---------------+---------+-----------+----------+--------------+ PERO     None                                         Acute          +---------+---------------+---------+-----------+----------+--------------+     Summary: Right: No evidence of common femoral vein obstruction. Left: Findings consistent with acute deep vein thrombosis involving the left femoral vein, left popliteal vein, left posterior tibial veins, and left peroneal veins. No cystic structure found in the popliteal fossa.  *See table(s) above for measurements and observations. Electronically signed by Monica Martinez MD on 05/08/2019 at 5:12:20 PM.    Final     Labs:  CBC: Recent Labs    05/07/19 1223 05/08/19 0721 05/09/19 0229 05/10/19 0134  WBC 6.3 5.6 5.6 5.0  HGB 14.8 14.5 14.3 13.7  HCT 43.1 43.0 41.4 40.4  PLT 153 158 143* 157    COAGS: Recent Labs    05/07/19 2247 05/08/19 0721 05/09/19 0229  APTT 98* 84* 92*    BMP: Recent Labs    05/07/19 1223 05/08/19 0721  NA 138 140  K 4.3 4.1  CL 108 106    CO2 20* 26  GLUCOSE 108* 104*  BUN 11 8  CALCIUM 8.8* 9.3  CREATININE 0.83 0.79  GFRNONAA >60 >60  GFRAA >60 >60    LIVER FUNCTION TESTS: No results for input(s): BILITOT, AST, ALT, ALKPHOS, PROT, ALBUMIN in the last 8760 hours.  TUMOR MARKERS: No results for input(s): AFPTM, CEA, CA199, CHROMGRNA in the last 8760 hours.  Assessment and Plan:  79 year old with bilateral saddle pulmonary emboli and left lower extremity DVT.  Although the clot burden in the pulmonary arteries was impressive, the pulmonary artery clot appeared to be nonocclusive and the patient did not have any significant right heart strain.  However, an IVC filter was placed due to the large clot burden in the pulmonary arteries and the clot burden in the left lower extremity.  Patient is doing well at home on oral anticoagulation.  His breathing is improving and he says that the left lower extremity swelling is improving.    We discussed IVC filter retrieval.  Explained that the filter could be removed at any time and there is not a strict timetable.  However, would prefer to retrieve the filter in the first 3 to 6 months if possible.  We discussed the long-term complications with IVC filters which include of caval thrombosis, filter fracture and filter migration.  Explained to the patient that the risk of caval thrombosis and worsening DVT is unlikely while he is anticoagulated.  Patient and his wife both expressed concerns about removing the filter too soon.  As a result, we will get a bilateral lower extremity venous duplex examination in 3 months to assess the lower extremity DVT.  Anticipate filter retrieval after the venous duplex assuming that the left lower extremity DVT has decreased or resolved.  Patient will NOT need to stop anticoagulation for filter retrieval.   Thank you for this interesting consult.  I greatly enjoyed meeting James Noble and look forward to participating in their care.  A copy of this report  was sent to the requesting provider on this date.  Electronically Signed: Burman Riis 05/21/2019, 1:09 PM   I spent a total of    10 Minutes  in remote  clinical consultation, greater than 50% of which was counseling/coordinating care for IVC filter management.    Visit type: Audio only (telephone). Audio (no video) only due to patient preference.. Alternative for in-person consultation at [facility], [address], [place], [place]. This visit type was conducted due to national recommendations for restrictions regarding the COVID-19 Pandemic (e.g. social distancing).  This format is felt to be most appropriate for this patient at this time.  All issues noted in this document were discussed and addressed.  Patient ID: James Noble, male   DOB: [birth year], 79 y.o.   MRN: [id number]

## 2019-05-28 ENCOUNTER — Other Ambulatory Visit: Payer: Medicare Other

## 2019-06-26 ENCOUNTER — Other Ambulatory Visit: Payer: Self-pay | Admitting: Diagnostic Radiology

## 2019-06-26 DIAGNOSIS — Z95828 Presence of other vascular implants and grafts: Secondary | ICD-10-CM

## 2019-07-03 ENCOUNTER — Other Ambulatory Visit: Payer: Self-pay | Admitting: Internal Medicine

## 2019-07-03 DIAGNOSIS — Z86711 Personal history of pulmonary embolism: Secondary | ICD-10-CM

## 2019-07-08 ENCOUNTER — Ambulatory Visit
Admission: RE | Admit: 2019-07-08 | Discharge: 2019-07-08 | Disposition: A | Discharge status: Home / Self Care | Payer: Medicare Other | Source: Ambulatory Visit | Attending: Internal Medicine | Admitting: Internal Medicine

## 2019-07-08 ENCOUNTER — Other Ambulatory Visit: Payer: Self-pay

## 2019-07-08 DIAGNOSIS — N400 Enlarged prostate without lower urinary tract symptoms: Secondary | ICD-10-CM | POA: Diagnosis not present

## 2019-07-08 DIAGNOSIS — Z86711 Personal history of pulmonary embolism: Secondary | ICD-10-CM

## 2019-07-08 MED ORDER — IOPAMIDOL (ISOVUE-300) INJECTION 61%
100.0000 mL | Freq: Once | INTRAVENOUS | Status: AC | PRN
  Administered 2019-07-08: 100 mL via INTRAVENOUS

## 2019-07-31 ENCOUNTER — Other Ambulatory Visit: Payer: Self-pay

## 2019-07-31 ENCOUNTER — Ambulatory Visit
Admission: RE | Admit: 2019-07-31 | Discharge: 2019-07-31 | Disposition: A | Discharge status: Home / Self Care | Payer: Medicare Other | Source: Ambulatory Visit | Attending: Diagnostic Radiology | Admitting: Diagnostic Radiology

## 2019-07-31 ENCOUNTER — Encounter: Payer: Self-pay | Admitting: *Deleted

## 2019-07-31 DIAGNOSIS — I10 Essential (primary) hypertension: Secondary | ICD-10-CM | POA: Diagnosis not present

## 2019-07-31 DIAGNOSIS — Z86711 Personal history of pulmonary embolism: Secondary | ICD-10-CM | POA: Diagnosis not present

## 2019-07-31 DIAGNOSIS — E7800 Pure hypercholesterolemia, unspecified: Secondary | ICD-10-CM | POA: Diagnosis not present

## 2019-07-31 DIAGNOSIS — M48061 Spinal stenosis, lumbar region without neurogenic claudication: Secondary | ICD-10-CM | POA: Diagnosis not present

## 2019-07-31 DIAGNOSIS — Z95828 Presence of other vascular implants and grafts: Secondary | ICD-10-CM

## 2019-07-31 DIAGNOSIS — Z86718 Personal history of other venous thrombosis and embolism: Secondary | ICD-10-CM | POA: Diagnosis not present

## 2019-07-31 DIAGNOSIS — Z1389 Encounter for screening for other disorder: Secondary | ICD-10-CM | POA: Diagnosis not present

## 2019-07-31 DIAGNOSIS — Z0000 Encounter for general adult medical examination without abnormal findings: Secondary | ICD-10-CM | POA: Diagnosis not present

## 2019-07-31 DIAGNOSIS — Z1159 Encounter for screening for other viral diseases: Secondary | ICD-10-CM | POA: Diagnosis not present

## 2019-07-31 HISTORY — PX: IR RADIOLOGIST EVAL & MGMT: IMG5224

## 2019-07-31 NOTE — Progress Notes (Signed)
Patient ID: James Noble, male   DOB: 11-15-1940, 79 y.o.   MRN: [id number]       Chief Complaint: Patient was seen in consultation today for at the request of Tyge Somers  Referring Physician(s): Candee Furbish;   History of Present Illness: James Noble is a 79 y.o. male with history of acute saddle pulmonary embolism and left lower extremity DVT.  Patient was treated with IV heparin and switched to Xarelto as an outpatient.  An IVC filter was placed while in the hospital due to the large clot burden in the chest and left lower extremity DVT.  Patient is feeling very good at this time.  He has no significant respiratory issues.  He is ambulating without difficulty.  He denies chest pain.  The swelling in the left calf and ankle has resolved.  He has been wearing compression stockings.  Bilateral lower extremity venous duplex was performed prior to today's visit.  The lower extremity venous duplex was negative for DVT in both lower extremities.  He has received his COVID-19 vaccines.  Past Medical History:  Diagnosis Date  . Cancer (HCC)    BASAL CELL , DR.LOMAX  . DVT (deep venous thrombosis) ([place])    a. L sided DVT 05/2019, s/p IVC filter 05/08/19.  Marland Kitchen Hiatal hernia   . Hx of colonic polyps    DR.MEDOFF  . Hyperlipidemia    BASED ON NMR;GILBERTS SYNDROME  . Hyperuricemia without signs inflammatory arthritis/tophaceous disease 2012   uric acid 7.7  . Saddle pulmonary embolus ([place]) 05/2019  . Sciatica   . Spinal stenosis    OF LS SPINE...DR.LOVE    Past Surgical History:  Procedure Laterality Date  . colonoscopy with polypectomy  2005 & 2008   Dr Earlean Shawl  . [place]  . INGUINAL HERNIA REPAIR  1968  . IR IVC FILTER PLMT / S&I /IMG GUID/MOD SED  05/08/2019  . IR RADIOLOGIST EVAL & MGMT  05/21/2019  . IR RADIOLOGIST EVAL & MGMT  07/31/2019  . steroid spinal injections  2013   X2, Dr Ernestina Patches  . TONSILLECTOMY AND ADENOIDECTOMY  1949  . UPPER GASTROINTESTINAL ENDOSCOPY  2008   Dr Earlean Shawl; Hiatal hernia  . VASECTOMY      Allergies: Dexlansoprazole and Nexium [esomeprazole magnesium]  Medications: Prior to Admission medications   Medication Sig Start Date End Date Taking? Authorizing Provider  amLODipine (NORVASC) 5 MG tablet Take 2 tablets (10 mg total) by mouth daily. Overdue for annual appt w/labs must see provider for refills 11/14/16   Binnie Rail, MD  Apoaequorin (PREVAGEN PO) Take 1 capsule by mouth daily.    [provider]  Ergocalciferol (VITAMIN D2) 400 units TABS Take 1 tablet by mouth daily.    [provider]  fluticasone (FLONASE) 50 MCG/ACT nasal spray Place 2 sprays into both nostrils at bedtime.    [provider]  guaiFENesin (MUCINEX) 600 MG 12 hr tablet Take 600 mg by mouth daily.    [provider]  guaiFENesin (ROBITUSSIN) 100 MG/5ML liquid Take 100 mg by mouth at bedtime.    [provider]  losartan (COZAAR) 100 MG tablet Take 1 tablet (100 mg total) by mouth daily. Must keep August appt for future refills 11/07/16   Binnie Rail, MD  Multiple Vitamin (MULTI-VITAMINS) TABS Take 1 tablet by mouth daily.    [provider]  Probiotic Product (PROBIOTIC ACIDOPHILUS BEADS PO) Take 1 capsule by mouth daily.    [provider]  Rivaroxaban (XARELTO STARTER PACK) 15 & 20 MG TBPK Follow package directions: Take one 15mg  tablet by mouth twice a day. On day 22, switch to one 20mg  tablet once a day. Take with food. 05/10/19   Dunn, Nedra Hai, PA-C  Rivaroxaban (XARELTO) 20 MG TABS tablet Take 1 tablet (20 mg total) by mouth daily. You will start this AFTER you complete your starter pack. 05/10/19   Dunn, Nedra Hai, PA-C  rosuvastatin (CRESTOR) 20 MG tablet TAKE ONE TABLET AT BEDTIME Patient taking differently: Take 20 mg by mouth at bedtime.  09/04/16   Binnie Rail, MD  vitamin B-12 (CYANOCOBALAMIN) 1000 MCG tablet Take 1,000 mcg by mouth daily.    [provider]     Family History    Problem Relation Age of Onset  . Heart attack Father 87        RHD, smoker  . Stroke Maternal Grandmother        late 38s  . Stroke Mother 41       smoker  . Stroke Maternal Uncle        X 3  . Stroke Maternal Grandfather   . Diabetes Neg Hx     Social History   Socioeconomic History  . Marital status: Married    Spouse name: Not on file  . Number of children: Not on file  . Years of education: Not on file  . Highest education level: Not on file  Occupational History  . Occupation: ORTHODONTIST  Tobacco Use  . Smoking status: Former Smoker    Quit date: 05/01/1968    Years since quitting: 51.2  . Smokeless tobacco: Never Used  . Tobacco comment: smoked 1960-1970, up to 1 ppd  Substance and Sexual Activity  . Alcohol use: Yes    Alcohol/week: 14.0 standard drinks    Types: 14 Glasses of wine per week    Comment:  socially  . Drug use: No  . Sexual activity: Not on file  Other Topics Concern  . Not on file  Social History Narrative   HEART HEALTHY DIET   EXERCISES 4-6 TIME WEEKLY   Social Determinants of Health   Financial Resource Strain:   . Difficulty of Paying Living Expenses:   Food Insecurity:   . Worried About Charity fundraiser in the Last Year:   . Arboriculturist in the Last Year:   Transportation Needs:   . Film/video editor (Medical):   Marland Kitchen Lack of Transportation (Non-Medical):   Physical Activity:   . Days of Exercise per Week:   . Minutes of Exercise per Session:   Stress:   . Feeling of Stress :   Social Connections:   . Frequency of Communication with Friends and Family:   . Frequency of Social Gatherings with Friends and Family:   . Attends Religious Services:   . Active Member of Clubs or Organizations:   . Attends Archivist Meetings:   Marland Kitchen Marital Status:     Review of Systems  Constitutional: Negative.   Respiratory: Negative.   Cardiovascular: Negative.     Vital Signs: There were no vitals taken for this  visit.  Physical Exam Constitutional:      Appearance: Normal appearance. He is not ill-appearing.  Musculoskeletal:        General: No swelling.     Comments: Bilateral calves and ankles look symmetric without swelling.  Neurological:     Mental Status: He is alert.  Imaging: CT ABDOMEN PELVIS W CONTRAST  Result Date: 07/09/2019 CLINICAL DATA:  Recent pulmonary embolism. Evaluate for occult malignancy. Creatinine was obtained on site at [facility] at [address] Results: Creatinine 0.9 mg/dL. EXAM: CT ABDOMEN AND PELVIS WITH CONTRAST TECHNIQUE: Multidetector CT imaging of the abdomen and pelvis was performed using the standard protocol following bolus administration of intravenous contrast. CONTRAST:  137mL ISOVUE-300 IOPAMIDOL (ISOVUE-300) INJECTION 61% COMPARISON:  None. FINDINGS: Lower Chest: No acute findings. Hepatobiliary: No hepatic masses identified. Gallbladder is unremarkable. No evidence of biliary ductal dilatation. Pancreas:  No mass or inflammatory changes. Spleen: Within normal limits in size and appearance. Adrenals/Urinary Tract: No masses identified. Small left renal cyst noted. No evidence of ureteral calculi or hydronephrosis. Stomach/Bowel: No evidence of obstruction, inflammatory process or abnormal fluid collections. Vascular/Lymphatic: No pathologically enlarged lymph nodes. No abdominal aortic aneurysm. Aortic atherosclerosis incidentally noted. IVC filter seen in appropriate position. Reproductive:  Mildly enlarged prostate. Other:  None. Musculoskeletal:  No suspicious bone lesions identified. IMPRESSION: 1. No evidence of neoplasm or other acute findings within the abdomen or pelvis. 2. Mildly enlarged prostate. Electronically Signed   By: Marlaine Hind M.D.   On: 07/09/2019 08:17   US Venous Img Lower Bilateral (DVT)  Result Date: 07/31/2019 CLINICAL DATA:  79 year old with history of large pulmonary embolism and left lower extremity DVT.  History of IVC filter placement. Patient presents for follow-up imaging and IVC filter management. EXAM: BILATERAL LOWER EXTREMITY VENOUS DOPPLER ULTRASOUND TECHNIQUE: Gray-scale sonography with graded compression, as well as color Doppler and duplex ultrasound were performed to evaluate the lower extremity deep venous systems from the level of the common femoral vein and including the common femoral, femoral, profunda femoral, popliteal and calf veins including the posterior tibial, peroneal and gastrocnemius veins when visible. The superficial great saphenous vein was also interrogated. Spectral Doppler was utilized to evaluate flow at rest and with distal augmentation maneuvers in the common femoral, femoral and popliteal veins. COMPARISON:  Lower extremity venous exam report from 05/08/2019 FINDINGS: RIGHT LOWER EXTREMITY Common Femoral Vein: No evidence of thrombus. Normal compressibility, respiratory phasicity and response to augmentation. Saphenofemoral Junction: No evidence of thrombus. Normal compressibility and flow on color Doppler imaging. Profunda Femoral Vein: No evidence of thrombus. Normal compressibility and flow on color Doppler imaging. Femoral Vein: No evidence of thrombus. Normal compressibility, respiratory phasicity and response to augmentation. Popliteal Vein: No evidence of thrombus. Normal compressibility, respiratory phasicity and response to augmentation. Calf Veins: Visualized right deep calf veins are patent without thrombus. Superficial Great Saphenous Vein: No evidence of thrombus. Normal compressibility. Venous Reflux:  None. Other Findings:  None. LEFT LOWER EXTREMITY Common Femoral Vein: No evidence of thrombus. Normal compressibility, respiratory phasicity and response to augmentation. Saphenofemoral Junction: No evidence of thrombus. Normal compressibility and flow on color Doppler imaging. Profunda Femoral Vein: No evidence of thrombus. Normal compressibility and flow on color  Doppler imaging. Femoral Vein: No evidence of thrombus. Normal compressibility, respiratory phasicity and response to augmentation. Popliteal Vein: No evidence of thrombus. Normal compressibility, respiratory phasicity and response to augmentation. Calf Veins: Visualized left deep calf veins are patent without thrombus. Superficial Great Saphenous Vein: No evidence of thrombus. Normal compressibility. Other Findings:  None. IMPRESSION: No evidence of deep venous thrombosis in either lower extremity. Electronically Signed   By: Markus Daft M.D.   On: 07/31/2019 11:20   IR Radiologist Eval & Mgmt  Result Date: 07/31/2019 Please refer to notes tab for details about interventional  procedure. (Op Note)   Labs:  CBC: Recent Labs    05/07/19 1223 05/08/19 0721 05/09/19 0229 05/10/19 0134  WBC 6.3 5.6 5.6 5.0  HGB 14.8 14.5 14.3 13.7  HCT 43.1 43.0 41.4 40.4  PLT 153 158 143* 157    COAGS: Recent Labs    05/07/19 2247 05/08/19 0721 05/09/19 0229  APTT 98* 84* 92*    BMP: Recent Labs    05/07/19 1223 05/08/19 0721  NA 138 140  K 4.3 4.1  CL 108 106  CO2 20* 26  GLUCOSE 108* 104*  BUN 11 8  CALCIUM 8.8* 9.3  CREATININE 0.83 0.79  GFRNONAA >60 >60  GFRAA >60 >60    LIVER FUNCTION TESTS: No results for input(s): BILITOT, AST, ALT, ALKPHOS, PROT, ALBUMIN in the last 8760 hours.  TUMOR MARKERS: No results for input(s): AFPTM, CEA, CA199, CHROMGRNA in the last 8760 hours.  Assessment and Plan:  79 year old with history of large bilateral pulmonary emboli and left lower extremity DVT.  Patient has done very well with anticoagulation.  Patient is currently asymptomatic and the swelling in the left lower extremity has resolved.  Lower extremity venous duplex was negative for DVT in either lower extremity today.  Patient has a retrievable IVC filter.  We discussed retrieval of the IVC filter.  I recommend that we remove the filter at this time because the lower extremity DVT has  resolved and he will continue with anticoagulation.  IVC filter is no longer needed.  We discussed IVC filter retrieval in depth.  We reviewed the patient's previous IVC filter placement images and an interval CT of the abdomen.  CT of the abdomen from 07/08/2019 demonstrates that the IVC filter is well-positioned below the renal veins and there is no evidence for IVC clot or filter clot.  Patient is comfortable with having the IVC filter removed at this time.  We will schedule the patient for the IVC filter retrieval at his convenience.  Procedure can be performed with anticoagulation so the patient will NOT stop taking his Xarelto.  Thank you for this interesting consult.  I greatly enjoyed meeting FENTON MONGELLI and look forward to participating in their care.  A copy of this report was sent to the requesting provider on this date.  Electronically Signed: Burman Riis 07/31/2019, 11:30 AM   I spent a total of    10 Minutes in face to face in clinical consultation, greater than 50% of which was counseling/coordinating care for IVC filter management.

## 2019-08-04 ENCOUNTER — Telehealth (HOSPITAL_COMMUNITY): Payer: Self-pay

## 2019-08-04 ENCOUNTER — Other Ambulatory Visit (HOSPITAL_COMMUNITY): Payer: Self-pay | Admitting: Diagnostic Radiology

## 2019-08-04 DIAGNOSIS — Z95828 Presence of other vascular implants and grafts: Secondary | ICD-10-CM

## 2019-08-04 NOTE — Telephone Encounter (Signed)
Called to schedule ivc filter retrieval, spoke to wife, pt not available. Will have pt call back to schedule. AW

## 2019-08-11 ENCOUNTER — Other Ambulatory Visit: Payer: Self-pay | Admitting: Student

## 2019-08-12 ENCOUNTER — Encounter (HOSPITAL_COMMUNITY): Payer: Self-pay

## 2019-08-12 ENCOUNTER — Ambulatory Visit (HOSPITAL_COMMUNITY)
Admission: RE | Admit: 2019-08-12 | Discharge: 2019-08-12 | Disposition: A | Discharge status: Home / Self Care | Payer: Medicare Other | Source: Ambulatory Visit | Attending: Diagnostic Radiology | Admitting: Diagnostic Radiology

## 2019-08-12 ENCOUNTER — Other Ambulatory Visit: Payer: Self-pay

## 2019-08-12 DIAGNOSIS — Z87891 Personal history of nicotine dependence: Secondary | ICD-10-CM | POA: Diagnosis not present

## 2019-08-12 DIAGNOSIS — E785 Hyperlipidemia, unspecified: Secondary | ICD-10-CM | POA: Diagnosis not present

## 2019-08-12 DIAGNOSIS — Z7901 Long term (current) use of anticoagulants: Secondary | ICD-10-CM | POA: Diagnosis not present

## 2019-08-12 DIAGNOSIS — Z79899 Other long term (current) drug therapy: Secondary | ICD-10-CM | POA: Diagnosis not present

## 2019-08-12 DIAGNOSIS — Z95828 Presence of other vascular implants and grafts: Secondary | ICD-10-CM

## 2019-08-12 DIAGNOSIS — Z86718 Personal history of other venous thrombosis and embolism: Secondary | ICD-10-CM | POA: Diagnosis not present

## 2019-08-12 DIAGNOSIS — Z452 Encounter for adjustment and management of vascular access device: Secondary | ICD-10-CM | POA: Diagnosis not present

## 2019-08-12 DIAGNOSIS — Z86711 Personal history of pulmonary embolism: Secondary | ICD-10-CM | POA: Insufficient documentation

## 2019-08-12 HISTORY — PX: IR IVC FILTER RETRIEVAL / S&I /IMG GUID/MOD SED: IMG5308

## 2019-08-12 LAB — CREATININE, SERUM
Creatinine, Ser: 0.93 mg/dL (ref 0.61–1.24)
GFR calc Af Amer: >60 mL/min (ref >60)
GFR calc non Af Amer: >60 mL/min (ref >60)

## 2019-08-12 MED ORDER — IOHEXOL 300 MG/ML  SOLN
100.0000 mL | Freq: Once | INTRAMUSCULAR | Status: AC | PRN
  Administered 2019-08-12: 10:00:00 50 mL via INTRAVENOUS

## 2019-08-12 MED ORDER — HYDROCODONE-ACETAMINOPHEN 5-325 MG PO TABS: 1.0000 | ORAL_TABLET | ORAL | Status: DC | PRN

## 2019-08-12 MED ORDER — SODIUM CHLORIDE 0.9 % IV SOLN
INTRAVENOUS | Status: AC | PRN
  Administered 2019-08-12: 10 mL/h via INTRAVENOUS

## 2019-08-12 MED ORDER — FENTANYL CITRATE (PF) 100 MCG/2ML IJ SOLN
INTRAMUSCULAR | Status: AC
  Filled 2019-08-12: qty 2

## 2019-08-12 MED ORDER — FENTANYL CITRATE (PF) 100 MCG/2ML IJ SOLN
INTRAMUSCULAR | Status: AC | PRN
  Administered 2019-08-12: 50 ug via INTRAVENOUS

## 2019-08-12 MED ORDER — MIDAZOLAM HCL 2 MG/2ML IJ SOLN
INTRAMUSCULAR | Status: AC | PRN
  Administered 2019-08-12: 1 mg via INTRAVENOUS

## 2019-08-12 MED ORDER — LIDOCAINE HCL 1 % IJ SOLN
INTRAMUSCULAR | Status: AC
  Filled 2019-08-12: qty 20

## 2019-08-12 MED ORDER — MIDAZOLAM HCL 2 MG/2ML IJ SOLN
INTRAMUSCULAR | Status: AC
  Filled 2019-08-12: qty 2

## 2019-08-12 MED ORDER — SODIUM CHLORIDE 0.9 % IV SOLN: INTRAVENOUS | Status: DC

## 2019-08-12 NOTE — Discharge Instructions (Signed)
Inferior Vena Cava Filter Removal, Care After This sheet gives you information about how to care for yourself after your procedure. Your health care provider may also give you more specific instructions. If you have problems or questions, contact your health care provider. What can I expect after the procedure? After the procedure, it is common to have:  Mild pain and bruising around your incision in your neck or groin.  Fatigue. Follow these instructions at home: Incision care   Follow instructions from your health care provider about how to take care of your incision. Make sure you: ? Wash your hands with soap and water before you change your bandage (dressing). If soap and water are not available, use hand sanitizer. ? Change your dressing as told by your health care provider.  Check your incision area every day for signs of infection. Check for: ? Redness, swelling, or more pain. ? Fluid or blood. ? Warmth. ? Pus or a bad smell. General instructions  Take over-the-counter and prescription medicines only as told by your health care provider.  Do not take baths, swim, or use a hot tub until your health care provider approves. Ask your health care provider if you may take showers. You may only be allowed to take sponge baths.  Do not drive for 24 hours if you were given a medicine to help you relax (sedative) during your procedure.  Return to your normal activities as told by your health care provider. Ask your health care provider what activities are safe for you.  Keep all follow-up visits as told by your health care provider. This is important. Contact a health care provider if:  You have chills or a fever.  You have redness, swelling, or more pain around your incision.  Your incision feels warm to the touch.  You have pus or a bad smell coming from your incision. Get help right away if:  You have blood coming from your incision (active bleeding). ? If you have  bleeding from the incision site, lie down, apply pressure to the area with a clean cloth or gauze, and get help right away.  You have chest pain.  You have difficulty breathing. Summary  Follow instructions from your health care provider about how to take care of your incision.  Return to your normal activities as told by your health care provider.  Check your incision area every day for signs of infection.  Get help right away if you have active bleeding, chest pain, or trouble breathing. This information is not intended to replace advice given to you by your health care provider. Make sure you discuss any questions you have with your health care provider. Document Revised: 03/30/2017 Document Reviewed: 10/26/2016 Elsevier Patient Education  2020 [employer]  

## 2019-08-12 NOTE — H&P (Addendum)
Chief Complaint: Patient was seen in consultation today for IVC filter retrieval at the request of [place]  Supervising Physician: Markus Daft  Patient Status: [facility] - Out-pt  History of Present Illness: James Noble is a 79 y.o. male with hx of PE back in January. Was felt to be high risk for further clot burden and therefore IVC filter was placed. He remains on Xarelto but is doing mch better. Has seen Dr. Anselm Pancoast and had negative LE duplex study. He is now scheduled for IVC filter retrieval. PMHx, meds, labs, imaging, allergies reviewed. Feels well, no recent fevers, chills, illness. Has been NPO today as directed.   Past Medical History:  Diagnosis Date  . Cancer (HCC)    BASAL CELL , DR.LOMAX  . DVT (deep venous thrombosis) ([place])    a. L sided DVT 05/2019, s/p IVC filter 05/08/19.  Marland Kitchen Hiatal hernia   . Hx of colonic polyps    DR.MEDOFF  . Hyperlipidemia    BASED ON NMR;GILBERTS SYNDROME  . Hyperuricemia without signs inflammatory arthritis/tophaceous disease 2012   uric acid 7.7  . 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 Sciatica   . Spinal stenosis    OF LS SPINE...DR.LOVE    Past Surgical History:  Procedure Laterality Date  . colonoscopy with polypectomy  2005 & 2008   Dr Earlean Shawl  . [place]  . INGUINAL HERNIA REPAIR  1968  . IR IVC FILTER PLMT / S&I /IMG GUID/MOD SED  05/08/2019  . IR RADIOLOGIST EVAL & MGMT  05/21/2019  . IR RADIOLOGIST EVAL & MGMT  07/31/2019  . steroid spinal injections  2013   X2, Dr Ernestina Patches  . TONSILLECTOMY AND ADENOIDECTOMY  1949  . UPPER GASTROINTESTINAL ENDOSCOPY  2008   Dr Earlean Shawl; Hiatal hernia  . VASECTOMY      Allergies: Dexlansoprazole and Nexium [esomeprazole magnesium]  Medications: Prior to Admission medications   Medication Sig Start Date End Date Taking? Authorizing Provider  amLODipine (NORVASC) 10 MG tablet Take 10 mg by mouth daily.   Yes [provider]  Coenzyme Q10 (CO Q-10) 100 MG CAPS  Take 100 mg by mouth daily.   Yes [provider]  fluticasone (FLONASE) 50 MCG/ACT nasal spray Place 2 sprays into both nostrils at bedtime.   Yes [provider]  guaiFENesin (ROBITUSSIN) 100 MG/5ML liquid Take 100 mg by mouth at bedtime.   Yes [provider]  loratadine (CLARITIN) 10 MG tablet Take 10 mg by mouth at bedtime.   Yes [provider]  losartan (COZAAR) 100 MG tablet Take 1 tablet (100 mg total) by mouth daily. Must keep August appt for future refills Patient taking differently: Take 100 mg by mouth daily.  11/07/16  Yes Burns, Claudina Lick, MD  Rivaroxaban (XARELTO) 20 MG TABS tablet Take 1 tablet (20 mg total) by mouth daily. You will start this AFTER you complete your starter pack. Patient taking differently: Take 10 mg by mouth daily.  05/10/19  Yes Dunn, Dayna N, PA-C  rosuvastatin (CRESTOR) 20 MG tablet TAKE ONE TABLET AT BEDTIME Patient taking differently: Take 20 mg by mouth at bedtime.  09/04/16  Yes Burns, Claudina Lick, MD  amLODipine (NORVASC) 5 MG tablet Take 2 tablets (10 mg total) by mouth daily. Overdue for annual appt w/labs must see provider for refills Patient not taking: Reported on 08/05/2019 11/14/16   Binnie Rail, MD  Rivaroxaban (XARELTO STARTER PACK) 15 & 20 MG TBPK Follow package directions: Take  one 15mg  tablet by mouth twice a day. On day 22, switch to one 20mg  tablet once a day. Take with food. Patient not taking: Reported on 08/05/2019 05/10/19   Charlie Pitter, PA-C     Family History  Problem Relation Age of Onset  . Heart attack Father 82        RHD, smoker  . Stroke Maternal Grandmother        late 73s  . Stroke Mother 74       smoker  . Stroke Maternal Uncle        X 3  . Stroke Maternal Grandfather   . Diabetes Neg Hx     Social History   Socioeconomic History  . Marital status: Married    Spouse name: Not on file  . Number of children: Not on file  . Years of education: Not on file  . Highest education level: Not  on file  Occupational History  . Occupation: ORTHODONTIST  Tobacco Use  . Smoking status: Former Smoker    Quit date: 05/01/1968    Years since quitting: 51.3  . Smokeless tobacco: Never Used  . Tobacco comment: smoked 1960-1970, up to 1 ppd  Substance and Sexual Activity  . Alcohol use: Yes    Alcohol/week: 14.0 standard drinks    Types: 14 Glasses of wine per week    Comment:  socially  . Drug use: No  . Sexual activity: Not on file  Other Topics Concern  . Not on file  Social History Narrative   HEART HEALTHY DIET   EXERCISES 4-6 TIME WEEKLY   Social Determinants of Health   Financial Resource Strain:   . Difficulty of Paying Living Expenses:   Food Insecurity:   . Worried About Charity fundraiser in the Last Year:   . Arboriculturist in the Last Year:   Transportation Needs:   . Film/video editor (Medical):   Marland Kitchen Lack of Transportation (Non-Medical):   Physical Activity:   . Days of Exercise per Week:   . Minutes of Exercise per Session:   Stress:   . Feeling of Stress :   Social Connections:   . Frequency of Communication with Friends and Family:   . Frequency of Social Gatherings with Friends and Family:   . Attends Religious Services:   . Active Member of Clubs or Organizations:   . Attends Archivist Meetings:   Marland Kitchen Marital Status:     Review of Systems: A 12 point ROS discussed and pertinent positives are indicated in the HPI above.  All other systems are negative.  Review of Systems  Vital Signs: BP (!) 147/80   Pulse 74   Temp 98.1 F (36.7 C) (Oral)   Resp 17   Ht 5\' 11"  (1.803 m)   Wt 84.4 kg   SpO2 98%   BMI 25.94 kg/m   Physical Exam Constitutional:      Appearance: Normal appearance.  HENT:     Mouth/Throat:     Mouth: Mucous membranes are moist.     Pharynx: Oropharynx is clear.  Cardiovascular:     Rate and Rhythm: Normal rate and regular rhythm.     Heart sounds: Normal heart sounds.  Pulmonary:     Effort:  Pulmonary effort is normal. No respiratory distress.     Breath sounds: Normal breath sounds.  Skin:    General: Skin is warm and dry.  Neurological:     General: No  focal deficit present.     Mental Status: He is alert and oriented to person, place, and time.  Psychiatric:        Mood and Affect: Mood normal.        Thought Content: Thought content normal.        Judgment: Judgment normal.     Imaging: US Venous Img Lower Bilateral (DVT)  Result Date: 07/31/2019 CLINICAL DATA:  79 year old with history of large pulmonary embolism and left lower extremity DVT. History of IVC filter placement. Patient presents for follow-up imaging and IVC filter management. EXAM: BILATERAL LOWER EXTREMITY VENOUS DOPPLER ULTRASOUND TECHNIQUE: Gray-scale sonography with graded compression, as well as color Doppler and duplex ultrasound were performed to evaluate the lower extremity deep venous systems from the level of the common femoral vein and including the common femoral, femoral, profunda femoral, popliteal and calf veins including the posterior tibial, peroneal and gastrocnemius veins when visible. The superficial great saphenous vein was also interrogated. Spectral Doppler was utilized to evaluate flow at rest and with distal augmentation maneuvers in the common femoral, femoral and popliteal veins. COMPARISON:  Lower extremity venous exam report from 05/08/2019 FINDINGS: RIGHT LOWER EXTREMITY Common Femoral Vein: No evidence of thrombus. Normal compressibility, respiratory phasicity and response to augmentation. Saphenofemoral Junction: No evidence of thrombus. Normal compressibility and flow on color Doppler imaging. Profunda Femoral Vein: No evidence of thrombus. Normal compressibility and flow on color Doppler imaging. Femoral Vein: No evidence of thrombus. Normal compressibility, respiratory phasicity and response to augmentation. Popliteal Vein: No evidence of thrombus. Normal compressibility, respiratory  phasicity and response to augmentation. Calf Veins: Visualized right deep calf veins are patent without thrombus. Superficial Great Saphenous Vein: No evidence of thrombus. Normal compressibility. Venous Reflux:  None. Other Findings:  None. LEFT LOWER EXTREMITY Common Femoral Vein: No evidence of thrombus. Normal compressibility, respiratory phasicity and response to augmentation. Saphenofemoral Junction: No evidence of thrombus. Normal compressibility and flow on color Doppler imaging. Profunda Femoral Vein: No evidence of thrombus. Normal compressibility and flow on color Doppler imaging. Femoral Vein: No evidence of thrombus. Normal compressibility, respiratory phasicity and response to augmentation. Popliteal Vein: No evidence of thrombus. Normal compressibility, respiratory phasicity and response to augmentation. Calf Veins: Visualized left deep calf veins are patent without thrombus. Superficial Great Saphenous Vein: No evidence of thrombus. Normal compressibility. Other Findings:  None. IMPRESSION: No evidence of deep venous thrombosis in either lower extremity. Electronically Signed   By: Markus Daft M.D.   On: 07/31/2019 11:20   IR Radiologist Eval & Mgmt  Result Date: 07/31/2019 Please refer to notes tab for details about interventional procedure. (Op Note)   Labs:  CBC: Recent Labs    05/07/19 1223 05/08/19 0721 05/09/19 0229 05/10/19 0134  WBC 6.3 5.6 5.6 5.0  HGB 14.8 14.5 14.3 13.7  HCT 43.1 43.0 41.4 40.4  PLT 153 158 143* 157    COAGS: Recent Labs    05/07/19 2247 05/08/19 0721 05/09/19 0229  APTT 98* 84* 92*    BMP: Recent Labs    05/07/19 1223 05/08/19 0721  NA 138 140  K 4.3 4.1  CL 108 106  CO2 20* 26  GLUCOSE 108* 104*  BUN 11 8  CALCIUM 8.8* 9.3  CREATININE 0.83 0.79  GFRNONAA >60 >60  GFRAA >60 >60    LIVER FUNCTION TESTS: No results for input(s): BILITOT, AST, ALT, ALKPHOS, PROT, ALBUMIN in the last 8760 hours.  TUMOR MARKERS: No results for  input(s): AFPTM, CEA, CA199,  [place] in the last 8760 hours.  Assessment and Plan: Hx of PE For IVC filter retrieval Risks and benefits discussed with the patient including, but not limited to bleeding, infection, contrast induced renal failure, filter fracture or migration which can lead to emergency surgery or even death, strut penetration with damage or irritation to adjacent structures and caval thrombosis.  All of the patient's questions were answered, patient is agreeable to proceed. Consent signed and in chart.    Thank you for this interesting consult.  I greatly enjoyed meeting James Noble and look forward to participating in their care.  A copy of this report was sent to the requesting provider on this date.  Electronically Signed: Ascencion Dike, PA-C 08/12/2019, 8:55 AM   I spent a total of 15 minutes in face to face in clinical consultation, greater than 50% of which was counseling/coordinating care for IVC filter retrieval

## 2019-08-12 NOTE — Procedures (Signed)
Interventional Radiology Procedure:   Indications: IVC filter is no longer needed.  Procedure: IVC venogram and filter retrieval.  Findings: Patent IVC.  Filter was successfully retrieved.  Complications: None     EBL: less than 20 ml  Plan: Bedrest for 2 hours, then discharge to home.  Continue anticoagulation.     Davisha Linthicum R. Anselm Pancoast, MD  Pager: [phone]

## 2019-08-14 ENCOUNTER — Ambulatory Visit (INDEPENDENT_AMBULATORY_CARE_PROVIDER_SITE_OTHER): Payer: Medicare Other | Admitting: Cardiology

## 2019-08-14 ENCOUNTER — Other Ambulatory Visit: Payer: Self-pay

## 2019-08-14 VITALS — BP 114/68 | HR 74 | Ht 71.0 in | Wt 194.0 lb

## 2019-08-14 DIAGNOSIS — Z86711 Personal history of pulmonary embolism: Secondary | ICD-10-CM | POA: Diagnosis not present

## 2019-08-14 DIAGNOSIS — E782 Mixed hyperlipidemia: Secondary | ICD-10-CM

## 2019-08-14 DIAGNOSIS — I10 Essential (primary) hypertension: Secondary | ICD-10-CM

## 2019-08-14 DIAGNOSIS — Z7901 Long term (current) use of anticoagulants: Secondary | ICD-10-CM

## 2019-08-14 NOTE — Progress Notes (Signed)
Cardiology Office Note:    Date:  08/14/2019   ID:  James Noble, DOB 07-21-1940, MRN [id number]  PCP:  Lavone Orn, MD  Cardiologist:  Candee Furbish, MD  Electrophysiologist:  None   Referring MD: Lavone Orn, MD     History of Present Illness:    James Noble is a 79 y.o. male here for the follow-up post saddle pulmonary embolism, left lower extremity DVT back on 05/07/2019.  Dr. Laurann Montana sent to hospital after diagnosis.  Overall he is doing very well. IVC filter removed on 08/12/2019, Dr. Anselm Pancoast.  Excellent.  Lower extremity Dopplers on repeat showed no evidence of DVT.  Abdominal CT unremarkable.  Original echocardiogram showed minimally dilated right ventricle but overall normal systolic function  I have also been seeing him for prevention, family history of CAD.  Father used to smoke.    Past Medical History:  Diagnosis Date  . Cancer (HCC)    BASAL CELL , DR.LOMAX  . DVT (deep venous thrombosis) ([place])    a. L sided DVT 05/2019, s/p IVC filter 05/08/19.  Marland Kitchen Hiatal hernia   . Hx of colonic polyps    DR.MEDOFF  . Hyperlipidemia    BASED ON NMR;GILBERTS SYNDROME  . Hyperuricemia without signs inflammatory arthritis/tophaceous disease 2012   uric acid 7.7  . 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 Sciatica   . Spinal stenosis    OF LS SPINE...DR.LOVE    Past Surgical History:  Procedure Laterality Date  . colonoscopy with polypectomy  2005 & 2008   Dr Earlean Shawl  . [place]  . INGUINAL HERNIA REPAIR  1968  . IR IVC FILTER PLMT / S&I /IMG GUID/MOD SED  05/08/2019  . IR IVC FILTER RETRIEVAL / S&I /IMG GUID/MOD SED  08/12/2019  . IR RADIOLOGIST EVAL & MGMT  05/21/2019  . IR RADIOLOGIST EVAL & MGMT  07/31/2019  . steroid spinal injections  2013   X2, Dr Ernestina Patches  . TONSILLECTOMY AND ADENOIDECTOMY  1949  . UPPER GASTROINTESTINAL ENDOSCOPY  2008   Dr Earlean Shawl; Hiatal hernia  . VASECTOMY      Current Medications: Current Meds  Medication Sig  . amLODipine  (NORVASC) 10 MG tablet Take 10 mg by mouth daily.  . Coenzyme Q10 (CO Q-10) 100 MG CAPS Take 100 mg by mouth daily.  . fluticasone (FLONASE) 50 MCG/ACT nasal spray Place 2 sprays into both nostrils at bedtime.  Marland Kitchen guaiFENesin (ROBITUSSIN) 100 MG/5ML liquid Take 100 mg by mouth at bedtime.  Marland Kitchen loratadine (CLARITIN) 10 MG tablet Take 10 mg by mouth at bedtime.  Marland Kitchen losartan (COZAAR) 100 MG tablet Take 1 tablet (100 mg total) by mouth daily. Must keep August appt for future refills (Patient taking differently: Take 100 mg by mouth daily. )  . Rivaroxaban (XARELTO) 20 MG TABS tablet Take 1 tablet (20 mg total) by mouth daily. You will start this AFTER you complete your starter pack. (Patient taking differently: Take 10 mg by mouth daily. )  . rosuvastatin (CRESTOR) 20 MG tablet TAKE ONE TABLET AT BEDTIME (Patient taking differently: Take 20 mg by mouth at bedtime. )     Allergies:   Dexlansoprazole and Nexium [esomeprazole magnesium]   Social History   Socioeconomic History  . Marital status: Married    Spouse name: Not on file  . Number of children: Not on file  . Years of education: Not on file  . Highest education level: Not on file  Occupational History  .  Occupation: ORTHODONTIST  Tobacco Use  . Smoking status: Former Smoker    Quit date: 05/01/1968    Years since quitting: 51.3  . Smokeless tobacco: Never Used  . Tobacco comment: smoked 1960-1970, up to 1 ppd  Substance and Sexual Activity  . Alcohol use: Yes    Alcohol/week: 14.0 standard drinks    Types: 14 Glasses of wine per week    Comment:  socially  . Drug use: No  . Sexual activity: Not on file  Other Topics Concern  . Not on file  Social History Narrative   HEART HEALTHY DIET   EXERCISES 4-6 TIME WEEKLY   Social Determinants of Health   Financial Resource Strain:   . Difficulty of Paying Living Expenses:   Food Insecurity:   . Worried About Charity fundraiser in the Last Year:   . Arboriculturist in the Last Year:    Transportation Needs:   . Film/video editor (Medical):   Marland Kitchen Lack of Transportation (Non-Medical):   Physical Activity:   . Days of Exercise per Week:   . Minutes of Exercise per Session:   Stress:   . Feeling of Stress :   Social Connections:   . Frequency of Communication with Friends and Family:   . Frequency of Social Gatherings with Friends and Family:   . Attends Religious Services:   . Active Member of Clubs or Organizations:   . Attends Archivist Meetings:   Marland Kitchen Marital Status:      Family History: The patient's family history includes Heart attack (age of onset: 42) in his father; Stroke in his maternal grandfather, maternal grandmother, and maternal uncle; Stroke (age of onset: 18) in his mother. There is no history of Diabetes.  ROS:   Please see the history of present illness.     All other systems reviewed and are negative.  EKGs/Labs/Other Studies Reviewed:    The following studies were reviewed today: As above  EKG:  EKG is not ordered today.    Recent Labs: 05/07/2019: B Natriuretic Peptide 91.8 05/08/2019: BUN 8; Potassium 4.1; Sodium 140 05/10/2019: Hemoglobin 13.7; Platelets 157 08/12/2019: Creatinine, Ser 0.93  Recent Lipid Panel    Component Value Date/Time   CHOL 134 01/13/2016 0738   CHOL 138 10/22/2014 0737   TRIG 78.0 01/13/2016 0738   TRIG 115 10/22/2014 0737   TRIG 98 03/19/2006 0000   HDL 38.50 (L) 01/13/2016 0738   HDL 40 10/22/2014 0737   CHOLHDL 3 01/13/2016 0738   VLDL 15.6 01/13/2016 0738   LDLCALC 80 01/13/2016 0738   LDLCALC 75 10/22/2014 0737    Physical Exam:    VS:  BP 114/68   Pulse 74   Ht 5\' 11"  (1.803 m)   Wt 194 lb (88 kg)   SpO2 98%   BMI 27.06 kg/m     Wt Readings from Last 3 Encounters:  08/14/19 194 lb (88 kg)  08/12/19 186 lb (84.4 kg)  05/16/19 193 lb (87.5 kg)     GEN:  Well nourished, well developed in no acute distress HEENT: Normal NECK: No JVD; No carotid bruits LYMPHATICS: No  lymphadenopathy CARDIAC: RRR, no murmurs, rubs, gallops RESPIRATORY:  Clear to auscultation without rales, wheezing or rhonchi  ABDOMEN: Soft, non-tender, non-distended MUSCULOSKELETAL:  No edema; No deformity  SKIN: Warm and dry, compression stockings in place NEUROLOGIC:  Alert and oriented x 3 PSYCHIATRIC:  Normal affect   ASSESSMENT:    1. Essential hypertension  2. History of pulmonary embolism   3. Chronic anticoagulation   4. Mixed hyperlipidemia    PLAN:    In order of problems listed above:  PE/DVT -May 07, 2019-Xarelto.  Eventually we will likely decrease Xarelto dose to 10 mg a day perhaps after 1 year of full PE treatment.  Unprovoked PE. -No further clots in the lower extremities. -IVC filter removed.  Essential hypertension -Norvasc 10 mg, could have side effect of lower extremity edema as well.  Overall doing very well.  Compression hose.  Losartan as well.  Chronic anticoagulation -Xarelto.  Last hemoglobin 14.1 creatinine 0.9.  Hyperlipidemia -Crestor 20 mg high intensity dose.  LDL last 84.  Prior treadmill test reassuring.  23-month follow-up.    Medication Adjustments/Labs and Tests Ordered: Current medicines are reviewed at length with the patient today.  Concerns regarding medicines are outlined above.  No orders of the defined types were placed in this encounter.  No orders of the defined types were placed in this encounter.   Patient Instructions  Medication Instructions:  The current medical regimen is effective;  continue present plan and medications.  *If you need a refill on your cardiac medications before your next appointment, please call your pharmacy*  Follow-Up: At [facility], you and your health needs are our priority.  As part of our continuing mission to provide you with exceptional heart care, we have created designated Provider Care Teams.  These Care Teams include your primary Cardiologist (physician) and Advanced  Practice Providers (APPs -  Physician Assistants and Nurse Practitioners) who all work together to provide you with the care you need, when you need it.  We recommend signing up for the patient portal called "MyChart".  Sign up information is provided on this After Visit Summary.  MyChart is used to connect with patients for Virtual Visits (Telemedicine).  Patients are able to view lab/test results, encounter notes, upcoming appointments, etc.  Non-urgent messages can be sent to your provider as well.   To learn more about what you can do with MyChart, go to [email or web address].    Your next appointment:   6 month(s)  The format for your next appointment:   In Person  Provider:   Candee Furbish, MD   Thank you for choosing [facility]!!         Signed, Candee Furbish, MD  08/14/2019 1:44 PM    [place]

## 2019-08-14 NOTE — Patient Instructions (Signed)

## 2019-09-28 ENCOUNTER — Other Ambulatory Visit: Payer: Self-pay | Admitting: Physician Assistant

## 2019-09-30 NOTE — Telephone Encounter (Signed)
Xarelto 20mg  refill request received. Pt is 79 years old, weight-88 kg, Crea-0.93 on 08/12/2019, last seen by Dr. Marlou Porch on 08/14/2019, Diagnosis-DVT & PE,CrCl-70.74ml/min. Dose is appropriate based on dosing criteria. Will have Pharmacist to advise. Per Dr Marlou Porch OV note on 08/14/2019: May 07, 2019-Xarelto.  Eventually we will likely decrease Xarelto dose to 10 mg a day perhaps after 1 year of full PE treatment.  Unprovoked PE. -No further clots in the lower extremities. -IVC filter removed.

## 2019-09-30 NOTE — Telephone Encounter (Signed)
Will continue full dose Xarelto for 1 year after PE/DVT in Jan 2021 per Dr Marlou Porch

## 2019-10-03 DIAGNOSIS — H25813 Combined forms of age-related cataract, bilateral: Secondary | ICD-10-CM | POA: Diagnosis not present

## 2019-11-28 DIAGNOSIS — E7800 Pure hypercholesterolemia, unspecified: Secondary | ICD-10-CM | POA: Diagnosis not present

## 2019-11-28 DIAGNOSIS — I10 Essential (primary) hypertension: Secondary | ICD-10-CM | POA: Diagnosis not present

## 2019-12-12 DIAGNOSIS — F419 Anxiety disorder, unspecified: Secondary | ICD-10-CM | POA: Diagnosis not present

## 2019-12-12 DIAGNOSIS — J3489 Other specified disorders of nose and nasal sinuses: Secondary | ICD-10-CM | POA: Diagnosis not present

## 2019-12-16 DIAGNOSIS — H903 Sensorineural hearing loss, bilateral: Secondary | ICD-10-CM | POA: Diagnosis not present

## 2019-12-16 DIAGNOSIS — H6123 Impacted cerumen, bilateral: Secondary | ICD-10-CM | POA: Diagnosis not present

## 2019-12-16 DIAGNOSIS — Z974 Presence of external hearing-aid: Secondary | ICD-10-CM | POA: Diagnosis not present

## 2020-01-19 DIAGNOSIS — H903 Sensorineural hearing loss, bilateral: Secondary | ICD-10-CM | POA: Diagnosis not present

## 2020-01-25 DIAGNOSIS — Z23 Encounter for immunization: Secondary | ICD-10-CM | POA: Diagnosis not present

## 2020-02-10 DIAGNOSIS — I10 Essential (primary) hypertension: Secondary | ICD-10-CM | POA: Diagnosis not present

## 2020-02-10 DIAGNOSIS — M79642 Pain in left hand: Secondary | ICD-10-CM | POA: Diagnosis not present

## 2020-02-10 DIAGNOSIS — J3489 Other specified disorders of nose and nasal sinuses: Secondary | ICD-10-CM | POA: Diagnosis not present

## 2020-02-10 DIAGNOSIS — M79641 Pain in right hand: Secondary | ICD-10-CM | POA: Diagnosis not present

## 2020-02-10 DIAGNOSIS — R42 Dizziness and giddiness: Secondary | ICD-10-CM | POA: Diagnosis not present

## 2020-02-18 DIAGNOSIS — D1801 Hemangioma of skin and subcutaneous tissue: Secondary | ICD-10-CM | POA: Diagnosis not present

## 2020-02-18 DIAGNOSIS — L821 Other seborrheic keratosis: Secondary | ICD-10-CM | POA: Diagnosis not present

## 2020-02-18 DIAGNOSIS — Z85828 Personal history of other malignant neoplasm of skin: Secondary | ICD-10-CM | POA: Diagnosis not present

## 2020-02-18 DIAGNOSIS — Z23 Encounter for immunization: Secondary | ICD-10-CM | POA: Diagnosis not present

## 2020-03-08 ENCOUNTER — Other Ambulatory Visit: Payer: Self-pay

## 2020-03-08 ENCOUNTER — Ambulatory Visit (INDEPENDENT_AMBULATORY_CARE_PROVIDER_SITE_OTHER): Payer: Medicare Other | Admitting: Cardiology

## 2020-03-08 ENCOUNTER — Encounter: Payer: Self-pay | Admitting: Cardiology

## 2020-03-08 VITALS — BP 122/68 | HR 90 | Ht 71.0 in | Wt 192.0 lb

## 2020-03-08 DIAGNOSIS — I4510 Unspecified right bundle-branch block: Secondary | ICD-10-CM | POA: Diagnosis not present

## 2020-03-08 DIAGNOSIS — I10 Essential (primary) hypertension: Secondary | ICD-10-CM

## 2020-03-08 DIAGNOSIS — R0981 Nasal congestion: Secondary | ICD-10-CM | POA: Diagnosis not present

## 2020-03-08 DIAGNOSIS — Z7901 Long term (current) use of anticoagulants: Secondary | ICD-10-CM | POA: Diagnosis not present

## 2020-03-08 DIAGNOSIS — Z86711 Personal history of pulmonary embolism: Secondary | ICD-10-CM

## 2020-03-08 DIAGNOSIS — M152 Bouchard's nodes (with arthropathy): Secondary | ICD-10-CM | POA: Diagnosis not present

## 2020-03-08 MED ORDER — RIVAROXABAN 10 MG PO TABS: 10.0000 mg | ORAL_TABLET | Freq: Every day | ORAL | 3 refills | Status: DC

## 2020-03-08 MED ORDER — RIVAROXABAN 20 MG PO TABS: 20.0000 mg | ORAL_TABLET | Freq: Every day | ORAL | 0 refills | Status: DC

## 2020-03-08 NOTE — Patient Instructions (Signed)
Medication Instructions:  Your physician has recommended you make the following change in your medication:  STOP Taking Xarelto 20 mg after April 30, 2020 START Xarelto  *If you need a refill on your cardiac medications before your next appointment, please call your pharmacy*   Lab Work: None Ordered If you have labs (blood work) drawn today and your tests are completely normal, you will receive your results only by: Marland Kitchen MyChart Message (if you have MyChart) OR . A paper copy in the mail If you have any lab test that is abnormal or we need to change your treatment, we will call you to review the results.    Testing/Procedures: None Ordered    Follow-Up: At [facility], you and your health needs are our priority.  As part of our continuing mission to provide you with exceptional heart care, we have created designated Provider Care Teams.  These Care Teams include your primary Cardiologist (physician) and Advanced Practice Providers (APPs -  Physician Assistants and Nurse Practitioners) who all work together to provide you with the care you need, when you need it.   Your next appointment:   1 year(s)  The format for your next appointment:   In Person  Provider:   You may see Candee Furbish, MD or one of the following Advanced Practice Providers on your designated Care Team:    Truitt Merle, NP  Cecilie Kicks, NP  Kathyrn Drown, NP

## 2020-03-08 NOTE — Progress Notes (Signed)
Cardiology Office Note:    Date:  03/08/2020   ID:  James Noble, DOB [birth year], MRN [id number]  PCP:  Lavone Orn, MD  [facility] Cardiologist:  Candee Furbish, MD  [facility] Electrophysiologist:  None   Referring MD: Lavone Orn, MD    History of Present Illness:    James Noble is a 79 y.o. male here for follow-up of acute saddle pulmonary embolism left lower extremity DVT hospitalized in January 2021.  From prior review: Initially treated with IV heparin.  Interventional radiology critical care medicine, Dr. Haroldine Laws all discussed therapy.  Since nonocclusive, no evidence of right heart strain, IV heparin was utilized with transition to Xarelto.  An IVC filter was placed after left femoral vein to distal DVT discovered.  Given the large nature of the DVT and concurrent saddle emboli, could not afford potential migration on early treatment.  He did well.  Eventually became less short of breath.  Traverse the hallways of the hospital well.  Echocardiogram performed showed mildly dilated right ventricle but overall normal systolic function and normal left ventricular function.    He had his IVC filter removed on 08/12/2019 by Dr. Anselm Pancoast.  Repeat lower extremity Doppler showed no evidence of DVT.  Abdominal CT was unremarkable.  Family history of CAD-father used to smoke.  I had been seeing previously for prevention efforts.  Walking with no SOB, no CP. 4-5 miles  Past Medical History:  Diagnosis Date   Cancer ([place])    BASAL CELL , DR.LOMAX   DVT (deep venous thrombosis) (HCC)    a. L sided DVT 05/2019, s/p IVC filter 05/08/19.   Hiatal hernia    Hx of colonic polyps    DR.MEDOFF   Hyperlipidemia    BASED ON NMR;GILBERTS SYNDROME   Hyperuricemia without signs inflammatory arthritis/tophaceous disease 2012   uric acid 7.7   Saddle pulmonary embolus ([place]) 05/2019   Sciatica    Spinal stenosis    OF LS SPINE...DR.LOVE    Past Surgical History:  Procedure  Laterality Date   colonoscopy with polypectomy  2005 & 2008   Dr Earlean Shawl   HEMORRHOID SURGERY  1986   INGUINAL HERNIA REPAIR  1968   IR IVC FILTER PLMT / S&I /IMG GUID/MOD SED  05/08/2019   IR IVC FILTER RETRIEVAL / S&I /IMG GUID/MOD SED  08/12/2019   IR RADIOLOGIST EVAL & MGMT  05/21/2019   IR RADIOLOGIST EVAL & MGMT  07/31/2019   steroid spinal injections  2013   X2, Dr Ernestina Patches   TONSILLECTOMY AND ADENOIDECTOMY  1949   UPPER GASTROINTESTINAL ENDOSCOPY  2008   Dr Earlean Shawl; Hiatal hernia   VASECTOMY      Current Medications: Current Meds  Medication Sig   amLODipine (NORVASC) 10 MG tablet Take 5 mg by mouth daily.    fluticasone (FLONASE) 50 MCG/ACT nasal spray Place 2 sprays into both nostrils at bedtime.   guaiFENesin (ROBITUSSIN) 100 MG/5ML liquid Take 100 mg by mouth at bedtime.   losartan (COZAAR) 50 MG tablet Take 50 mg by mouth daily.   rivaroxaban (XARELTO) 20 MG TABS tablet Take 1 tablet (20 mg total) by mouth daily with supper.   rosuvastatin (CRESTOR) 20 MG tablet TAKE ONE TABLET AT BEDTIME   [DISCONTINUED] rivaroxaban (XARELTO) 20 MG TABS tablet Take 1 tablet (20 mg total) by mouth daily with supper.     Allergies:   Dexlansoprazole and Nexium [esomeprazole magnesium]   Social History   Socioeconomic History   Marital  status: Married    Spouse name: Not on file   Number of children: Not on file   Years of education: Not on file   Highest education level: Not on file  Occupational History   Occupation: ORTHODONTIST  Tobacco Use   Smoking status: Former Smoker    Quit date: 05/01/1968    Years since quitting: 51.8   Smokeless tobacco: Never Used   Tobacco comment: smoked 1960-1970, up to 1 ppd  Substance and Sexual Activity   Alcohol use: Yes    Alcohol/week: 14.0 standard drinks    Types: 14 Glasses of wine per week    Comment:  socially   Drug use: No   Sexual activity: Not on file  Other Topics Concern   Not on file  Social  History Narrative   HEART HEALTHY DIET   EXERCISES 4-6 TIME WEEKLY   Social Determinants of Health   Financial Resource Strain:    Difficulty of Paying Living Expenses: Not on file  Food Insecurity:    Worried About [place] in the Last Year: Not on file   Ran Out of Food in the Last Year: Not on file  Transportation Needs:    Lack of Transportation (Medical): Not on file   Lack of Transportation (Non-Medical): Not on file  Physical Activity:    Days of Exercise per Week: Not on file   Minutes of Exercise per Session: Not on file  Stress:    Feeling of Stress : Not on file  Social Connections:    Frequency of Communication with Friends and Family: Not on file   Frequency of Social Gatherings with Friends and Family: Not on file   Attends Religious Services: Not on file   Active Member of Clubs or Organizations: Not on file   Attends Archivist Meetings: Not on file   Marital Status: Not on file     Family History: The patient's family history includes Heart attack (age of onset: 62) in his father; Stroke in his maternal grandfather, maternal grandmother, and maternal uncle; Stroke (age of onset: 7) in his mother. There is no history of Diabetes.  ROS:   Please see the history of present illness.    Denies any bleeding fevers chills nausea vomiting syncope.  All other systems reviewed and are negative.  EKGs/Labs/Other Studies Reviewed:    The following studies were reviewed today:  Nuclear stress test 02/08/2017: Medium sized, moderate intensity fixed inferolateral perfusion defect with normal wall motion, suspicious of artifact. No reversible ischemia. LVEF 57%. This is a low risk study.  Echocardiogram 05/07/2019 in the setting of acute PE:  1. Left ventricular ejection fraction, by visual estimation, is 55 to  60%. The left ventricle has normal function. There is moderately increased  left ventricular hypertrophy.  2. Moderate  asymmetric hypertrophy measuring 73mm in basal septum (72mm in  posterior wall)  3. Global right ventricle has normal systolic function.The right  ventricular size is mildly enlarged.  4. The mitral valve is normal in structure. No evidence of mitral valve  regurgitation.  5. The aortic valve is tricuspid. Aortic valve regurgitation is trivial.  Mild aortic valve sclerosis without stenosis.  6. The tricuspid valve is normal in structure.  7. The pulmonic valve was not well visualized. Pulmonic valve  regurgitation is trivial.  8. Left atrial size was normal.  9. Right atrial size was normal.  10. The inferior vena cava is normal in size with greater than 50%  respiratory variability, suggesting right atrial pressure of 3 mmHg.  11. TR signal is inadequate for assessing pulmonary artery systolic  pressure.  12. Thrombus seen at the bifurcation of the pulmonary arteries.    EKG: Prior EKG right bundle branch block  Recent Labs: 05/07/2019: B Natriuretic Peptide 91.8 05/08/2019: BUN 8; Potassium 4.1; Sodium 140 05/10/2019: Hemoglobin 13.7; Platelets 157 08/12/2019: Creatinine, Ser 0.93  Recent Lipid Panel    Component Value Date/Time   CHOL 134 01/13/2016 0738   CHOL 138 10/22/2014 0737   TRIG 78.0 01/13/2016 0738   TRIG 115 10/22/2014 0737   TRIG 98 03/19/2006 0000   HDL 38.50 (L) 01/13/2016 0738   HDL 40 10/22/2014 0737   CHOLHDL 3 01/13/2016 0738   VLDL 15.6 01/13/2016 0738   LDLCALC 80 01/13/2016 0738   LDLCALC 75 10/22/2014 0737     Risk Assessment/Calculations:       Physical Exam:    VS:  BP 122/68    Pulse 90    Ht 5\' 11"  (1.803 m)    Wt 192 lb (87.1 kg)    SpO2 97%    BMI 26.78 kg/m     Wt Readings from Last 3 Encounters:  03/08/20 192 lb (87.1 kg)  08/14/19 194 lb (88 kg)  08/12/19 186 lb (84.4 kg)     GEN:  Well nourished, well developed in no acute distress HEENT: Normal NECK: No JVD; No carotid bruits LYMPHATICS: No lymphadenopathy CARDIAC: RRR,  no murmurs, rubs, gallops RESPIRATORY:  Clear to auscultation without rales, wheezing or rhonchi  ABDOMEN: Soft, non-tender, non-distended MUSCULOSKELETAL:  No edema; No deformity  SKIN: Warm and dry NEUROLOGIC:  Alert and oriented x 3 PSYCHIATRIC:  Normal affect   ASSESSMENT:    1. History of pulmonary embolism   2. RBBB   3. Chronic anticoagulation   4. Essential hypertension    PLAN:    In order of problems listed above:  PE DVT -Unprovoked May 07, 2019-on Xarelto. -Thoughts are to decrease Xarelto to 10 mg once a day after full PE treatment for 1 year.  We have given him a written prescription to start in January. -Reassuring DVT repeat study negative. -IVC filter removed.  Chronic anticoagulation -Currently on Xarelto with hemoglobin and creatinine closely being monitored.  Hyperlipidemia -On Crestor 20 mg high intensity dose with LDL of 87 in the past.  Goal LDL less than 70.  Has had a prior treadmill test that was reassuring.  He is not having any anginal symptoms.  Essential hypertension -Had some minor dizziness upon standing.  Blood pressures may have been slightly low at home.  He had cut his losartan in half from 100 down to 50.  Doing better.  He is seeing Dr. Laurann Montana later this afternoon.    Shared Decision Making/Informed Consent      Medication Adjustments/Labs and Tests Ordered: Current medicines are reviewed at length with the patient today.  Concerns regarding medicines are outlined above.  No orders of the defined types were placed in this encounter.  Meds ordered this encounter  Medications   rivaroxaban (XARELTO) 10 MG TABS tablet    Sig: Take 1 tablet (10 mg total) by mouth daily.    Dispense:  90 tablet    Refill:  3    Start Xarelto 10 mg Jan. 1 2022 to replace Xarelto 20 mg   rivaroxaban (XARELTO) 20 MG TABS tablet    Sig: Take 1 tablet (20 mg total) by mouth daily with supper.  Dispense:  30 tablet    Refill:  0    Stop taking  Xarelto 20 mg after 04/30/20 and start Xarelto 10 mg on 05/01/20    Patient Instructions  Medication Instructions:  Your physician has recommended you make the following change in your medication:  STOP Taking Xarelto 20 mg after April 30, 2020 START Xarelto  *If you need a refill on your cardiac medications before your next appointment, please call your pharmacy*   Lab Work: None Ordered If you have labs (blood work) drawn today and your tests are completely normal, you will receive your results only by:  [place] (if you have MyChart) OR  A paper copy in the mail If you have any lab test that is abnormal or we need to change your treatment, we will call you to review the results.    Testing/Procedures: None Ordered    Follow-Up: At [facility], you and your health needs are our priority.  As part of our continuing mission to provide you with exceptional heart care, we have created designated Provider Care Teams.  These Care Teams include your primary Cardiologist (physician) and Advanced Practice Providers (APPs -  Physician Assistants and Nurse Practitioners) who all work together to provide you with the care you need, when you need it.   Your next appointment:   1 year(s)  The format for your next appointment:   In Person  Provider:   You may see Candee Furbish, MD or one of the following Advanced Practice Providers on your designated Care Team:    Truitt Merle, NP  Cecilie Kicks, NP  Kathyrn Drown, NP        Signed, Candee Furbish, MD  03/08/2020 10:30 AM    [place]

## 2020-03-19 ENCOUNTER — Other Ambulatory Visit: Payer: Self-pay | Admitting: Physician Assistant

## 2020-03-19 NOTE — Telephone Encounter (Signed)
Pt last saw Dr Marlou Porch 03/08/20, last labs 08/12/19 Creat 0.93, age 79, weight 87.1kg, CrCl 81.95, based on CrCl pt is on appropriate dosage of Xarelto 20mg  QD.  Will refill rx.  Per Dr Marlou Porch last note pt is going to decrease Xarelto to 10mg  daily in January 2022 after 1 full year full dose treatment.  Pt was given rx for Xarelto 10mg  tablets at 03/08/20 OV.  Will refill current rx x 1 90 day supply only since pt will be decreasing dosage in January.

## 2020-06-08 DIAGNOSIS — G8929 Other chronic pain: Secondary | ICD-10-CM | POA: Diagnosis not present

## 2020-06-08 DIAGNOSIS — I10 Essential (primary) hypertension: Secondary | ICD-10-CM | POA: Diagnosis not present

## 2020-06-08 DIAGNOSIS — E7800 Pure hypercholesterolemia, unspecified: Secondary | ICD-10-CM | POA: Diagnosis not present

## 2020-06-08 DIAGNOSIS — K219 Gastro-esophageal reflux disease without esophagitis: Secondary | ICD-10-CM | POA: Diagnosis not present

## 2020-07-15 DIAGNOSIS — H25013 Cortical age-related cataract, bilateral: Secondary | ICD-10-CM | POA: Diagnosis not present

## 2020-07-15 DIAGNOSIS — H2513 Age-related nuclear cataract, bilateral: Secondary | ICD-10-CM | POA: Diagnosis not present

## 2020-07-23 IMAGING — XA Imaging study
2 series · 2 of 2 positions shown · non-contrast
Comparison: none

CLINICAL DATA: Lumbosacral spondylosis without myelopathy. Low back
pain radiating into the right buttock and thigh. No left leg
radiculopathy. 100% relief for 2 months after prior injection on
09/27/2017.

[Series 2: ortho adipose · 1 of 1 slices shown (1 of 2)]
[im 1/1]
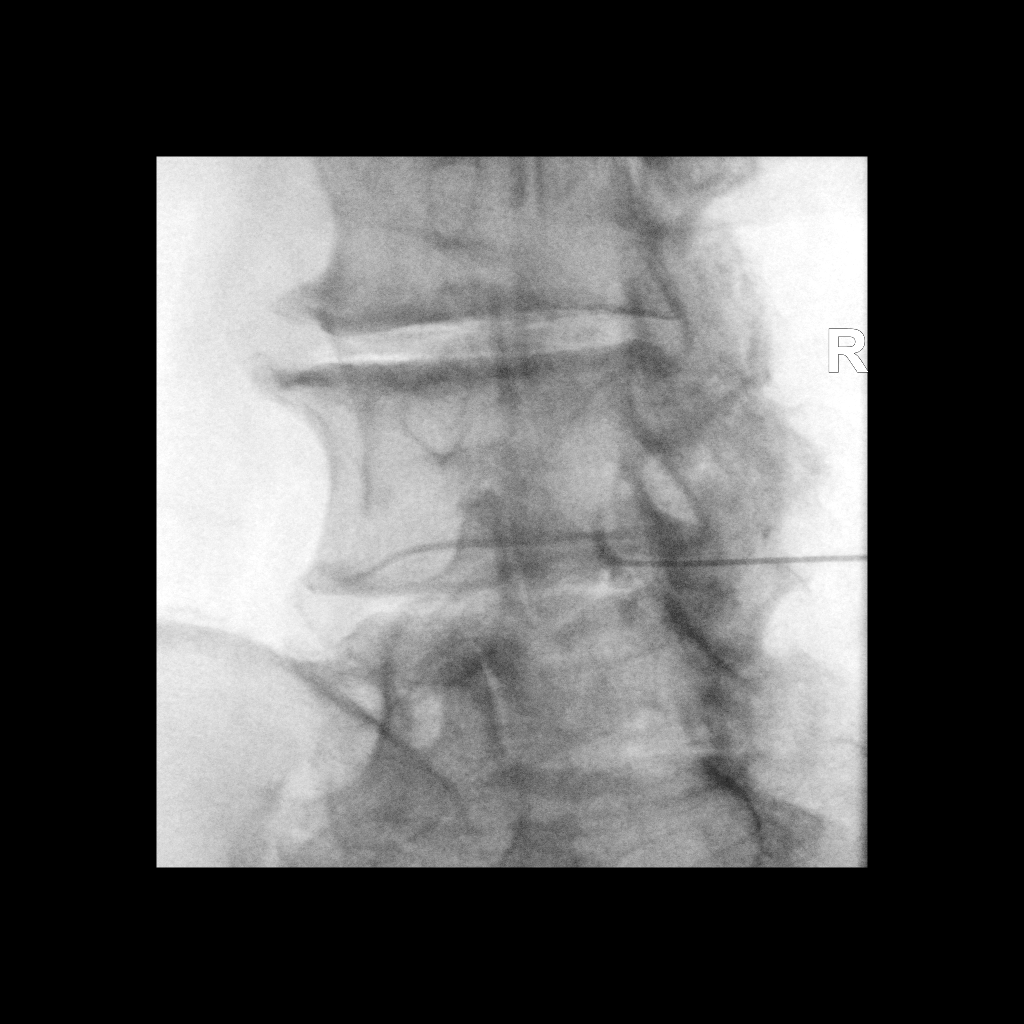

[Series 3: ortho adipose · 1 of 1 slices shown (2 of 2)]
[im 1/1]
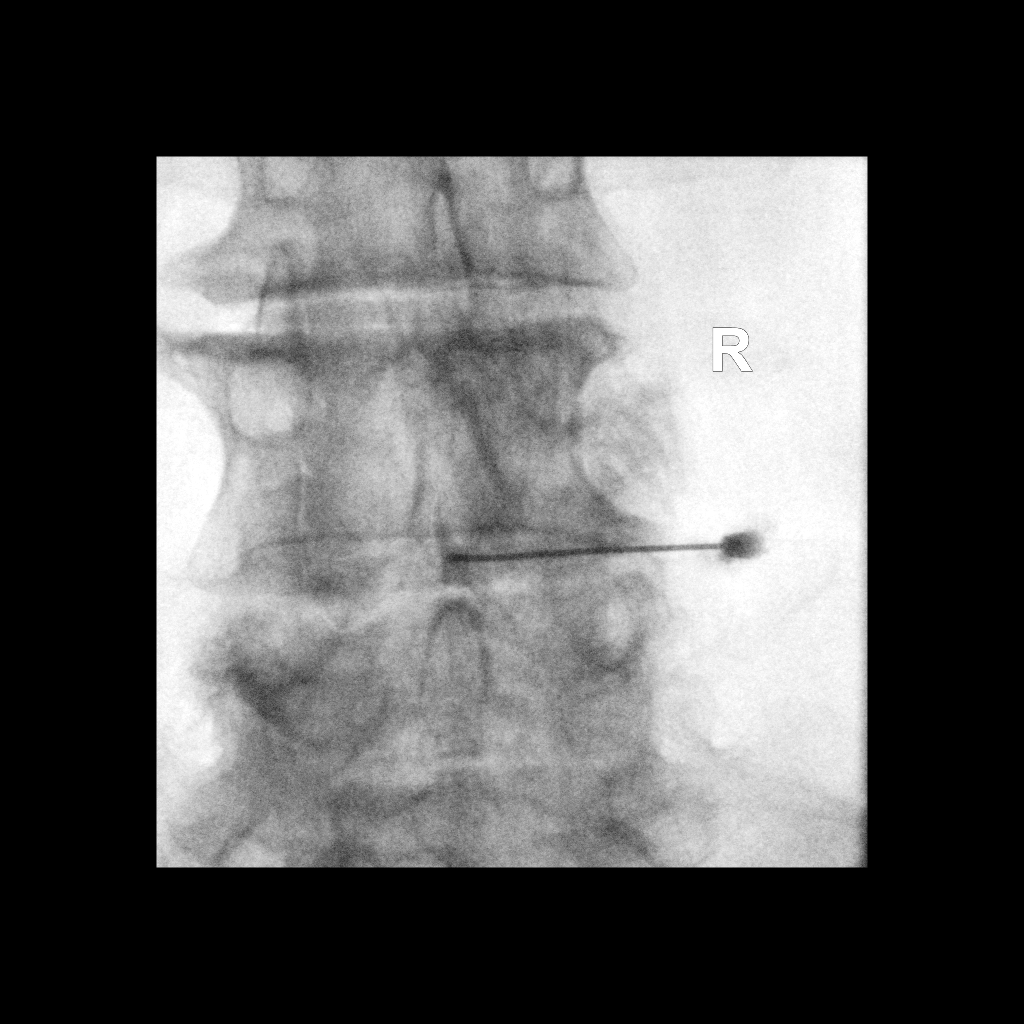

[2 of 2 positions shown; findings below may reference images not displayed]

FLUOROSCOPY TIME:  Radiation Exposure Index (as provided by the
fluoroscopic device): 9.0 mGy

Fluoroscopy Time:  34 seconds

Number of Acquired Images:  0

PROCEDURE:
The procedure, risks, benefits, and alternatives were explained to
the patient. Questions regarding the procedure were encouraged and
answered. The patient understands and consents to the procedure.

LUMBAR EPIDURAL INJECTION:

An interlaminar approach was performed on the right at L4-L5. The
overlying skin was cleansed and anesthetized. A 3.5 inch 20 gauge
epidural needle was advanced using loss-of-resistance technique.

DIAGNOSTIC EPIDURAL INJECTION:

Injection of Isovue-M 200 shows a good epidural pattern with spread
above and below the level of needle placement, primarily on the
right. No vascular opacification is seen.

THERAPEUTIC EPIDURAL INJECTION:

120 mg of Depo-Medrol mixed with 3 mL of 1% lidocaine were
instilled. The procedure was well-tolerated, and the patient was
discharged thirty minutes following the injection in good condition.

COMPLICATIONS:
None immediate.
IMPRESSION: Technically successful epidural injection on the right at L4-L5 #2.

## 2020-08-03 DIAGNOSIS — F419 Anxiety disorder, unspecified: Secondary | ICD-10-CM | POA: Diagnosis not present

## 2020-08-03 DIAGNOSIS — Z86718 Personal history of other venous thrombosis and embolism: Secondary | ICD-10-CM | POA: Diagnosis not present

## 2020-08-03 DIAGNOSIS — K219 Gastro-esophageal reflux disease without esophagitis: Secondary | ICD-10-CM | POA: Diagnosis not present

## 2020-08-03 DIAGNOSIS — I10 Essential (primary) hypertension: Secondary | ICD-10-CM | POA: Diagnosis not present

## 2020-08-03 DIAGNOSIS — I700 Atherosclerosis of aorta: Secondary | ICD-10-CM | POA: Diagnosis not present

## 2020-08-03 DIAGNOSIS — Z86711 Personal history of pulmonary embolism: Secondary | ICD-10-CM | POA: Diagnosis not present

## 2020-08-03 DIAGNOSIS — Z1389 Encounter for screening for other disorder: Secondary | ICD-10-CM | POA: Diagnosis not present

## 2020-08-03 DIAGNOSIS — E7800 Pure hypercholesterolemia, unspecified: Secondary | ICD-10-CM | POA: Diagnosis not present

## 2020-08-03 DIAGNOSIS — Z0000 Encounter for general adult medical examination without abnormal findings: Secondary | ICD-10-CM | POA: Diagnosis not present

## 2020-08-13 DIAGNOSIS — Z974 Presence of external hearing-aid: Secondary | ICD-10-CM | POA: Diagnosis not present

## 2020-08-13 DIAGNOSIS — H6123 Impacted cerumen, bilateral: Secondary | ICD-10-CM | POA: Diagnosis not present

## 2020-08-13 DIAGNOSIS — H903 Sensorineural hearing loss, bilateral: Secondary | ICD-10-CM | POA: Diagnosis not present

## 2020-09-18 DIAGNOSIS — Z23 Encounter for immunization: Secondary | ICD-10-CM | POA: Diagnosis not present

## 2020-09-22 DIAGNOSIS — U071 COVID-19: Secondary | ICD-10-CM | POA: Diagnosis not present

## 2020-10-04 DIAGNOSIS — G8929 Other chronic pain: Secondary | ICD-10-CM | POA: Diagnosis not present

## 2020-10-04 DIAGNOSIS — M152 Bouchard's nodes (with arthropathy): Secondary | ICD-10-CM | POA: Diagnosis not present

## 2020-10-04 DIAGNOSIS — E7800 Pure hypercholesterolemia, unspecified: Secondary | ICD-10-CM | POA: Diagnosis not present

## 2020-10-04 DIAGNOSIS — K219 Gastro-esophageal reflux disease without esophagitis: Secondary | ICD-10-CM | POA: Diagnosis not present

## 2020-10-04 DIAGNOSIS — I10 Essential (primary) hypertension: Secondary | ICD-10-CM | POA: Diagnosis not present

## 2020-10-13 DIAGNOSIS — K219 Gastro-esophageal reflux disease without esophagitis: Secondary | ICD-10-CM | POA: Diagnosis not present

## 2020-10-13 DIAGNOSIS — Z86010 Personal history of colonic polyps: Secondary | ICD-10-CM | POA: Diagnosis not present

## 2020-10-13 DIAGNOSIS — Z7901 Long term (current) use of anticoagulants: Secondary | ICD-10-CM | POA: Diagnosis not present

## 2020-10-13 DIAGNOSIS — Z86711 Personal history of pulmonary embolism: Secondary | ICD-10-CM | POA: Diagnosis not present

## 2020-10-13 DIAGNOSIS — R1319 Other dysphagia: Secondary | ICD-10-CM | POA: Diagnosis not present

## 2020-10-22 ENCOUNTER — Telehealth: Payer: Self-pay | Admitting: *Deleted

## 2020-10-22 NOTE — Telephone Encounter (Signed)
   [facility] Pre-operative Risk Assessment    Patient Name: James Noble  DOB: 18-Feb-1941  MRN: [id number]   HEARTCARE STAFF: - Please ensure there is not already an duplicate clearance open for this procedure. - Under Visit Info/Reason for Call, type in Other and utilize the format Clearance MM/DD/YY or Clearance TBD. Do not use dashes or single digits. - If request is for dental extraction, please clarify the # of teeth to be extracted. - If the patient is currently at the dentist's office, call Pre-Op APP to address. If the patient is not currently in the dentist office, please route to the Pre-Op pool  Request for surgical clearance:  What type of surgery is being performed? COLONOSCOPY   When is this surgery scheduled? 03/01/21   What type of clearance is required (medical clearance vs. Pharmacy clearance to hold med vs. Both)? BOTH  Are there any medications that need to be held prior to surgery and how long? [facility]   Practice name and name of physician performing surgery? [facility]; DR. JEFFREY MEDOFF  What is the office phone number? [phone]   7.   What is the office fax number? [phone]  8.   Anesthesia type (None, local, MAC, general) ? NOT LISTED (PROPOFOL? )   Julaine Hua 10/22/2020, 4:33 PM  _________________________________________________________________   (provider comments below)

## 2020-10-25 ENCOUNTER — Telehealth: Payer: Self-pay | Admitting: Cardiology

## 2020-10-25 NOTE — Telephone Encounter (Signed)
Pt returning call to pre op provider. See clearance notes in epic

## 2020-10-25 NOTE — Telephone Encounter (Signed)
Left message with wife. He will call back.

## 2020-10-25 NOTE — Telephone Encounter (Signed)
Patient with diagnosis of unprovoked DVT and saddle PE 05/2019 on Xarelto for anticoagulation. Dose was decreased to 10mg  daily for long term prevention of recurrent VTE after 1 year of treatment with 20mg  daily dosing. Repeat study negative for DVT.  Procedure: colonoscopy Date of procedure: 03/01/21  CrCl 69mL/min Platelet count 157K  Per office protocol, patient can hold Xarelto for 1 day prior to procedure.

## 2020-10-25 NOTE — Telephone Encounter (Signed)
Patient was returning call 

## 2020-10-26 NOTE — Telephone Encounter (Signed)
   Name: KYLEN SCHLIEP  DOB: 21-Sep-1940  MRN: [id number]   Primary Cardiologist: Candee Furbish, MD  Chart reviewed as part of pre-operative protocol coverage. Patient was contacted 10/26/2020 in reference to pre-operative risk assessment for pending surgery as outlined below.  JSEAN TAUSSIG was last seen on 03/08/20 by Dr. Marlou Porch.  Since that day, JOSPH NORFLEET has done well. He does not have a history of ischemic heart disease. He can complete more than 4.0 METS without angina.   He may hold xarelto for 1 day prior to procedure.   Therefore, based on ACC/AHA guidelines, the patient would be at acceptable risk for the planned procedure without further cardiovascular testing.   The patient was advised that if he develops new symptoms prior to surgery to contact our office to arrange for a follow-up visit, and he verbalized understanding.  I will route this recommendation to the requesting party via Epic fax function and remove from pre-op pool. Please call with questions.  Tami Lin Hannahmarie Asberry, PA 10/26/2020, 10:26 AM

## 2020-10-27 ENCOUNTER — Telehealth: Payer: Self-pay

## 2020-10-27 NOTE — Telephone Encounter (Addendum)
   [facility] Pre-operative Risk Assessment    Patient Name: James Noble  DOB: 29-Jun-1940  MRN: [id number]   HEARTCARE STAFF: - Please ensure there is not already an duplicate clearance open for this procedure. - Under Visit Info/Reason for Call, type in Other and utilize the format Clearance MM/DD/YY or Clearance TBD. Do not use dashes or single digits. - If request is for dental extraction, please clarify the # of teeth to be extracted. - If the patient is currently at the dentist's office, call Pre-Op APP to address. If the patient is not currently in the dentist office, please route to the Pre-Op pool  Request for surgical clearance:  What type of surgery is being performed? Esophagogastroduodenoscopy   When is this surgery scheduled? 03/15/2021   What type of clearance is required (medical clearance vs. Pharmacy clearance to hold med vs. Both)? Both  Are there any medications that need to be held prior to surgery and how long? Xarelto   Practice name and name of physician performing surgery? Dr Earlean Shawl at [facility]   What is the office phone number? [phone]   7.   What is the office fax number? [phone]  8.   Anesthesia type (None, local, MAC, general) ? Profolol   James Noble 10/27/2020, 3:29 PM  _________________________________________________________________   (provider comments below)

## 2020-11-05 DIAGNOSIS — Z85828 Personal history of other malignant neoplasm of skin: Secondary | ICD-10-CM | POA: Diagnosis not present

## 2020-11-05 DIAGNOSIS — L738 Other specified follicular disorders: Secondary | ICD-10-CM | POA: Diagnosis not present

## 2020-11-05 DIAGNOSIS — D1801 Hemangioma of skin and subcutaneous tissue: Secondary | ICD-10-CM | POA: Diagnosis not present

## 2020-11-05 DIAGNOSIS — D2271 Melanocytic nevi of right lower limb, including hip: Secondary | ICD-10-CM | POA: Diagnosis not present

## 2020-11-05 DIAGNOSIS — I8391 Asymptomatic varicose veins of right lower extremity: Secondary | ICD-10-CM | POA: Diagnosis not present

## 2020-11-05 DIAGNOSIS — L308 Other specified dermatitis: Secondary | ICD-10-CM | POA: Diagnosis not present

## 2020-11-05 DIAGNOSIS — L940 Localized scleroderma [morphea]: Secondary | ICD-10-CM | POA: Diagnosis not present

## 2020-11-05 DIAGNOSIS — L821 Other seborrheic keratosis: Secondary | ICD-10-CM | POA: Diagnosis not present

## 2020-11-05 DIAGNOSIS — L720 Epidermal cyst: Secondary | ICD-10-CM | POA: Diagnosis not present

## 2020-12-13 DIAGNOSIS — L308 Other specified dermatitis: Secondary | ICD-10-CM | POA: Diagnosis not present

## 2020-12-13 DIAGNOSIS — D485 Neoplasm of uncertain behavior of skin: Secondary | ICD-10-CM | POA: Diagnosis not present

## 2020-12-22 DIAGNOSIS — M3310 Other dermatopolymyositis, organ involvement unspecified: Secondary | ICD-10-CM | POA: Diagnosis not present

## 2020-12-22 DIAGNOSIS — R21 Rash and other nonspecific skin eruption: Secondary | ICD-10-CM | POA: Diagnosis not present

## 2020-12-22 DIAGNOSIS — M329 Systemic lupus erythematosus, unspecified: Secondary | ICD-10-CM | POA: Diagnosis not present

## 2020-12-27 DIAGNOSIS — M329 Systemic lupus erythematosus, unspecified: Secondary | ICD-10-CM | POA: Diagnosis not present

## 2021-01-17 DIAGNOSIS — H25813 Combined forms of age-related cataract, bilateral: Secondary | ICD-10-CM | POA: Diagnosis not present

## 2021-01-25 DIAGNOSIS — Z23 Encounter for immunization: Secondary | ICD-10-CM | POA: Diagnosis not present

## 2021-02-02 DIAGNOSIS — K219 Gastro-esophageal reflux disease without esophagitis: Secondary | ICD-10-CM | POA: Diagnosis not present

## 2021-02-02 DIAGNOSIS — M152 Bouchard's nodes (with arthropathy): Secondary | ICD-10-CM | POA: Diagnosis not present

## 2021-02-02 DIAGNOSIS — I10 Essential (primary) hypertension: Secondary | ICD-10-CM | POA: Diagnosis not present

## 2021-02-02 DIAGNOSIS — E7800 Pure hypercholesterolemia, unspecified: Secondary | ICD-10-CM | POA: Diagnosis not present

## 2021-02-02 DIAGNOSIS — G8929 Other chronic pain: Secondary | ICD-10-CM | POA: Diagnosis not present

## 2021-02-11 DIAGNOSIS — I10 Essential (primary) hypertension: Secondary | ICD-10-CM | POA: Diagnosis not present

## 2021-02-11 DIAGNOSIS — F418 Other specified anxiety disorders: Secondary | ICD-10-CM | POA: Diagnosis not present

## 2021-02-11 DIAGNOSIS — R1314 Dysphagia, pharyngoesophageal phase: Secondary | ICD-10-CM | POA: Diagnosis not present

## 2021-02-25 DIAGNOSIS — Z23 Encounter for immunization: Secondary | ICD-10-CM | POA: Diagnosis not present

## 2021-03-01 DIAGNOSIS — D123 Benign neoplasm of transverse colon: Secondary | ICD-10-CM | POA: Diagnosis not present

## 2021-03-01 DIAGNOSIS — Z1211 Encounter for screening for malignant neoplasm of colon: Secondary | ICD-10-CM | POA: Diagnosis not present

## 2021-03-01 DIAGNOSIS — Z86010 Personal history of colonic polyps: Secondary | ICD-10-CM | POA: Diagnosis not present

## 2021-03-01 DIAGNOSIS — K769 Liver disease, unspecified: Secondary | ICD-10-CM | POA: Diagnosis not present

## 2021-03-01 DIAGNOSIS — K635 Polyp of colon: Secondary | ICD-10-CM | POA: Diagnosis not present

## 2021-03-01 DIAGNOSIS — D122 Benign neoplasm of ascending colon: Secondary | ICD-10-CM | POA: Diagnosis not present

## 2021-03-01 DIAGNOSIS — D125 Benign neoplasm of sigmoid colon: Secondary | ICD-10-CM | POA: Diagnosis not present

## 2021-03-15 DIAGNOSIS — K219 Gastro-esophageal reflux disease without esophagitis: Secondary | ICD-10-CM | POA: Diagnosis not present

## 2021-03-15 DIAGNOSIS — R1319 Other dysphagia: Secondary | ICD-10-CM | POA: Diagnosis not present

## 2021-03-15 DIAGNOSIS — K449 Diaphragmatic hernia without obstruction or gangrene: Secondary | ICD-10-CM | POA: Diagnosis not present

## 2021-03-15 DIAGNOSIS — K222 Esophageal obstruction: Secondary | ICD-10-CM | POA: Diagnosis not present

## 2021-03-15 DIAGNOSIS — K2289 Other specified disease of esophagus: Secondary | ICD-10-CM | POA: Diagnosis not present

## 2021-03-15 DIAGNOSIS — K208 Other esophagitis without bleeding: Secondary | ICD-10-CM | POA: Diagnosis not present

## 2021-03-15 DIAGNOSIS — R1310 Dysphagia, unspecified: Secondary | ICD-10-CM | POA: Diagnosis not present

## 2021-03-28 ENCOUNTER — Telehealth: Payer: Self-pay | Admitting: *Deleted

## 2021-03-28 NOTE — Telephone Encounter (Signed)
   Pre-operative Risk Assessment    Patient Name: James Noble  DOB: 12/15/1940 MRN: [id number]      Request for Surgical Clearance   Procedure:   ENDOSCOPY  Date of Surgery: Clearance 04/14/21                                 Surgeon:  DR. Dellis Filbert MEDOFF Surgeon's Group or Practice Name:  [facility] Phone number:  [phone] Fax number:  [phone]   Type of Clearance Requested: - Medical  - Pharmacy:  Hold Rivaroxaban (Xarelto)     Type of Anesthesia:   Not Indicated   Additional requests/questions:   [name]   03/28/2021, 1:25 PM

## 2021-03-29 ENCOUNTER — Telehealth: Payer: Self-pay | Admitting: Physician Assistant

## 2021-03-29 NOTE — Telephone Encounter (Signed)
Patient with diagnosis of unprovoked DVT and saddle PE 05/2019 on Xarelto for anticoagulation. Currently taking lower 10mg  daily dose for prevention of recurrent VTE.  Procedure: endoscopy Date of procedure: 04/14/21  CrCl 37mL/min Platelet count 194K  Per office protocol, patient can hold Xarelto for 1 day prior to procedure.

## 2021-03-29 NOTE — Telephone Encounter (Signed)
Left VM

## 2021-03-29 NOTE — Telephone Encounter (Signed)
Patient's wife was returning phone call in regards to clearance

## 2021-03-30 NOTE — Telephone Encounter (Signed)
   Name: James Noble  DOB: 1941-02-17  MRN: [id number]  Primary Cardiologist: Candee Furbish, MD  Chart reviewed as part of pre-operative protocol coverage. Patient has not been seen in our office in over 1 year (last visit 03/08/2020). Because of James Noble past medical history and time since last visit, he will require a follow-up visit in order to better assess preoperative cardiovascular risk.  Pre-op covering staff: - Please schedule appointment and call patient to inform them. If patient already had an upcoming appointment within acceptable timeframe, please add "pre-op clearance" to the appointment notes so provider is aware. - Please contact requesting surgeon's office via preferred method (i.e, phone, fax) to inform them of need for appointment prior to surgery.   James Mclean, PA-C  03/30/2021, 9:15 AM

## 2021-03-30 NOTE — Telephone Encounter (Signed)
Thanks!  Loel Dubonnet, NP

## 2021-03-30 NOTE — Telephone Encounter (Signed)
Pt called back and s/w scheduler and schedule an appt for pre op clearance. Laurann Montana, NP 04/08/21 8:25 at [facility] location. I will forward  notes to NP for upcoming appt. Will send FYI to requesting office pt has appt 04/08/21.

## 2021-03-30 NOTE — Telephone Encounter (Signed)
Please see other phone note in regards to pre-op clearance. Patient has not been seen in over 1 year; therefore, he will require an office visit.  Darreld Mclean, PA-C 03/30/2021 9:17 AM

## 2021-03-30 NOTE — Telephone Encounter (Signed)
Left message for pt to call back and schedule an appt for pre op clearance with Dr. Marlou Porch or APP. At the time I reviewed schedules, the only appt I could find is a TOC slot 04/06/21 at [place] location with Laurann Montana, NP who did give the ok to use the 04/06/21 8:25 slot for pre op appt for the pt. Pt's procedure is 04/14/21.

## 2021-03-31 NOTE — Telephone Encounter (Signed)
Our office received our clearance notes back with the recommendations that were given for the pt's upcoming procedure. On the notes that were sent back to our office there is a hand written note by James Noble, note reads as below:  Since we are dilating it would be most ideal to hold for 2 days prior. May want to let Dr. Marlou Porch office know so he can approve at visit on 04/06/21-Courtney  I s/w Dr. Liliane Channel office and left a message for James Noble only as an [place]. If at all possible when asking for clearance request, if we could have complete information that may include complete information of procedure to be done, as well as if MD or surgeon has a preference how long they would like a blood thinner to be held, this would help the pre op provider as well as the cardiologist and our Pharm-D make a better informed recommendation for the upcoming procedure for the pt.   Clearance request that our office originally received only stated that it was for an Endoscopy and no specifics how long preferred to hold blood thinner.  Again, my call today to James Noble was merely an FYI so that we may both be more efficient in our process of trying to help all involved in the clearance for procedure. I did leave my number if there was any questions.   If I have complete and accurate information needed for a procedure, I am better able to help provide correct information to the pre op provider and the cardiologist. I strive to make sure that all information is correct to best of my abilities.   I assured Dr. Liliane Channel office that I will update the provider as to the recommendations that were written on the notes today.   Pt has appt 04/08/21 @ 8:25 am with James Montana, NP; once the pt is cleared our office will be sure to fax clearance notes.   THIS IS UPDATE INFORMATION AS TO THE PROCEDURE AND BLOOD THINNER ENDOSCOPY WITH DILATION HOLD XARELTO x 2 DAYS PRIOR.

## 2021-03-31 NOTE — Telephone Encounter (Addendum)
I s/w Loma Sousa, [place] for Dr. Earlean Shawl. Confirmed all pre op information that was needed to make better informed recommendations. Courtney apologized for the missing information that was needed. I stated no need to apologize, we help each other out. Loma Sousa, did also state that the pt will resume Xarelto within 48 hours s/p procedure. I thanked her for the s/p info as I did not have that information. I assured Loma Sousa that I will update Laurann Montana, NP who will be seeing the pt as to when pt will resume Xarelto. Courtney and I both agree that our pt's are number one and helping each other is vital.

## 2021-03-31 NOTE — Telephone Encounter (Signed)
Pt with plan for EGD. GI request to hold Xarelto 48 hours prior to procedure. Per pharmacy, plan to hold 24 hours prior to procedure.   Will route to Dr. Marlou Porch for his input regarding requested increased hold time.   Loel Dubonnet, NP

## 2021-04-01 ENCOUNTER — Other Ambulatory Visit: Payer: Self-pay | Admitting: Cardiology

## 2021-04-01 NOTE — Telephone Encounter (Signed)
Patient has upcoming appointment with Laurann Montana, NP-C.  I will defer  his preoperative cardiac evaluation to her at that time.  Urban Gibson has been discussing case with Dr. Marlou Porch and knows details for procedure.  I will remove patient from the preoperative pool at this time.  Jossie Ng. Oneal Biglow NP-C    04/01/2021, 9:45 AM [place] [address] Office [phone] Fax [phone]

## 2021-04-01 NOTE — Telephone Encounter (Signed)
Prescription refill request for Xarelto received.  Indication: DVT, PE Last office visit: 03/08/2020 Weight: 87.1 kg  Age: 80 yo  Scr: 0.97, 12/17/2020 CrCl: 77 ml/min   Scheduled to see Callie 04/08/2021.   Pt is on the correct dose of Xarelto- OV note 03/08/2020

## 2021-04-07 NOTE — Progress Notes (Signed)
Office Visit    Patient Name: James Noble Date of Encounter: 04/07/2021  PCP:  Lavone Orn, [place]  Cardiologist:  Candee Furbish, MD  Advanced Practice Provider:  No care team member to display Electrophysiologist:  None   Chief Complaint    James Noble is a 80 y.o. male with a hx of unprovoked DVT and saddle PE 05/2019 on Xarelto for anticoagulation, hyperlipidemia, hypertension presents today for preop clearance for endoscopy   Past Medical History    Past Medical History:  Diagnosis Date   Cancer ([place])    BASAL CELL , DR.LOMAX   DVT (deep venous thrombosis) ([place])    a. L sided DVT 05/2019, s/p IVC filter 05/08/19.   Hiatal hernia    Hx of colonic polyps    DR.MEDOFF   Hyperlipidemia    BASED ON NMR;GILBERTS SYNDROME   Hyperuricemia without signs inflammatory arthritis/tophaceous disease 2012   uric acid 7.7   Saddle pulmonary embolus ([place]) 05/2019   Sciatica    Spinal stenosis    OF LS SPINE...DR.LOVE   Past Surgical History:  Procedure Laterality Date   colonoscopy with polypectomy  2005 & 2008   Dr Earlean Shawl   HEMORRHOID SURGERY  1986   INGUINAL HERNIA REPAIR  1968   IR IVC FILTER PLMT / S&I /IMG GUID/MOD SED  05/08/2019   IR IVC FILTER RETRIEVAL / S&I /IMG GUID/MOD SED  08/12/2019   IR RADIOLOGIST EVAL & MGMT  05/21/2019   IR RADIOLOGIST EVAL & MGMT  07/31/2019   steroid spinal injections  2013   X2, Dr Ernestina Patches   TONSILLECTOMY AND ADENOIDECTOMY  1949   UPPER GASTROINTESTINAL ENDOSCOPY  2008   Dr Earlean Shawl; Hiatal hernia   VASECTOMY      Allergies  Allergies  Allergen Reactions   Dexlansoprazole Nausea And Vomiting   Nexium [Esomeprazole Magnesium] Nausea And Vomiting    History of Present Illness    James Noble is a 81 y.o. male with a hx of unprovoked DVT and saddle PE 05/2019 on Xarelto for anticoagulation, hyperlipidemia, hypertension last seen 03/08/20. Family history notable for coronary disease in his father.  He  was hospitalized with acute saddle pulmonary embolism and LLE DVT in January 2021. IV heparin utilized with transition to Xarelto. IVC filter placed after L femoral vein to distal DVT discovered. Echo with milddly dilated RV but overall normal LVEF and no significant valvular abnormalities. IVC removed 07/2019. Repeat LE doppler with no evidence of DVT. He was last seen 03/2020 by Dr. Marlou Porch doing well. At one year mark 05/2020 Xarelto transitioned to preventative 10mg  QD.   He presents today for clearance for endoscopy with his wife.  Very pleasant gentleman.  He and his wife stay active by having a personal trainer come to their home. Reports no shortness of breath nor dyspnea on exertion. Reports no chest pain, pressure, or tightness. No edema, orthopnea, PND. Reports no palpitations.  Notes occasional sensation of "hot flash "after the shower.  We discussed that this could be related to orthostasis as he notes occasional lightheadedness with position changes.  Encouraged to take a lukewarm shower instead of hot shower.  Recommend slow position changes and staying well-hydrated.  EKGs/Labs/Other Studies Reviewed:   The following studies were reviewed today: Nuclear stress test 02/08/2017: Medium sized, moderate intensity fixed inferolateral perfusion defect with normal wall motion, suspicious of artifact. No reversible ischemia. LVEF 57%. This is a low risk study.  Echocardiogram 05/07/2019 in the setting of acute PE:   1. Left ventricular ejection fraction, by visual estimation, is 55 to  60%. The left ventricle has normal function. There is moderately increased  left ventricular hypertrophy.   2. Moderate asymmetric hypertrophy measuring 60mm in basal septum (26mm in  posterior wall)   3. Global right ventricle has normal systolic function.The right  ventricular size is mildly enlarged.   4. The mitral valve is normal in structure. No evidence of mitral valve  regurgitation.   5. The aortic  valve is tricuspid. Aortic valve regurgitation is trivial.  Mild aortic valve sclerosis without stenosis.   6. The tricuspid valve is normal in structure.   7. The pulmonic valve was not well visualized. Pulmonic valve  regurgitation is trivial.   8. Left atrial size was normal.   9. Right atrial size was normal.  10. The inferior vena cava is normal in size with greater than 50%  respiratory variability, suggesting right atrial pressure of 3 mmHg.  11. TR signal is inadequate for assessing pulmonary artery systolic  pressure.  12. Thrombus seen at the bifurcation of the pulmonary arteries.     EKG:  EKG is ordered today.  The ekg ordered today demonstrates sinus rhythm 85 bpm with PAC and PVC.  Stable right bundle branch block.  Stable T wave inversion to lead III, aVF, V2, V3.  No acute ST/T wave changes.  Recent Labs: No results found for requested labs within last 8760 hours.  Recent Lipid Panel    Component Value Date/Time   CHOL 134 01/13/2016 0738   CHOL 138 10/22/2014 0737   TRIG 78.0 01/13/2016 0738   TRIG 115 10/22/2014 0737   TRIG 98 03/19/2006 0000   HDL 38.50 (L) 01/13/2016 0738   HDL 40 10/22/2014 0737   CHOLHDL 3 01/13/2016 0738   VLDL 15.6 01/13/2016 0738   LDLCALC 80 01/13/2016 0738   LDLCALC 75 10/22/2014 0737    Home Medications   No outpatient medications have been marked as taking for the 04/08/21 encounter (Appointment) with Loel Dubonnet, NP.     Review of Systems      All other systems reviewed and are otherwise negative except as noted above.  Physical Exam    VS:  There were no vitals taken for this visit. , BMI There is no height or weight on file to calculate BMI.  Wt Readings from Last 3 Encounters:  03/08/20 192 lb (87.1 kg)  08/14/19 194 lb (88 kg)  08/12/19 186 lb (84.4 kg)     GEN: Well nourished, well developed, in no acute distress. HEENT: normal. Neck: Supple, no JVD, carotid bruits, or masses. Cardiac: RRR, no murmurs,  rubs, or gallops. No clubbing, cyanosis, edema.  Radials/PT 2+ and equal bilaterally.  Respiratory:  Respirations regular and unlabored, clear to auscultation bilaterally. GI: Soft, nontender, nondistended. MS: No deformity or atrophy. Skin: Warm and dry, no rash. Neuro:  Strength and sensation are intact. Psych: Normal affect.  Assessment & Plan    Preop clearance -Upcoming endoscopy. According to the Revised Cardiac Risk Index (RCRI), his Perioperative Risk of Major Cardiac Event is (%): 0.4. His  Functional Capacity in METs is: 6.79 according to the Duke Activity Status Index (DASI).  He is acceptable risk for endoscopy without additional cardiovascular testing.  Per discussion with Dr. Marlou Porch and pharmacy preference is to hold Xarelto only 24 hours prior to procedure but if desired by gastroenterology would be acceptable to hold  Xarelto 48 hours prior to procedure.  He will resume postprocedure at the direction of his gastroenterologist.  PE / DVT / Chronic anticoagulation - Unprovoked DVT/PE 05/2019. IVC filter previously removed.  Denies bleeding complications on Xarelto 10 mg daily.  Refill provided.  HLD - 07/2020 LDL 86. LDL goal <100.  Not currently on statin therapy.  Continue to follow with primary care.  RBBB -Stable finding by EKG.  Asymptomatic with no lightheadedness, dizziness, near-syncope, syncope.  Continue monitor with periodic EKG  HTN -BP well controlled.  Reports occasional orthostatic symptoms though they are infrequent and he is managing by making position changes slowly, stay well-hydrated.  He will continue losartan 50 mg daily and amlodipine 5 mg daily.  We discussed that he if he has worsening orthostasis or persistent blood pressure less than 120/60 could trial losartan 50 mg daily alone.  He will contact our office if he decides to do this. Heart healthy diet and regular cardiovascular exercise encouraged.    PAC/PVC -asymptomatic with no palpitations.  He will  contact her office for worsening palpitations.  As he is asymptomatic will not add AV nodal blocking therapy at this time.  Disposition: Follow up in 1 year(s) with Candee Furbish, MD or APP.  Signed, Loel Dubonnet, NP 04/07/2021, 6:39 PM [facility]

## 2021-04-08 ENCOUNTER — Ambulatory Visit (INDEPENDENT_AMBULATORY_CARE_PROVIDER_SITE_OTHER): Payer: Medicare Other | Admitting: Family

## 2021-04-08 ENCOUNTER — Other Ambulatory Visit: Payer: Self-pay

## 2021-04-08 ENCOUNTER — Encounter (HOSPITAL_BASED_OUTPATIENT_CLINIC_OR_DEPARTMENT_OTHER): Payer: Self-pay | Admitting: Family

## 2021-04-08 VITALS — BP 110/70 | HR 85 | Ht 71.0 in | Wt 197.0 lb

## 2021-04-08 DIAGNOSIS — I491 Atrial premature depolarization: Secondary | ICD-10-CM

## 2021-04-08 DIAGNOSIS — I493 Ventricular premature depolarization: Secondary | ICD-10-CM | POA: Diagnosis not present

## 2021-04-08 DIAGNOSIS — I4510 Unspecified right bundle-branch block: Secondary | ICD-10-CM

## 2021-04-08 DIAGNOSIS — Z01818 Encounter for other preprocedural examination: Secondary | ICD-10-CM | POA: Diagnosis not present

## 2021-04-08 DIAGNOSIS — Z86718 Personal history of other venous thrombosis and embolism: Secondary | ICD-10-CM

## 2021-04-08 DIAGNOSIS — I10 Essential (primary) hypertension: Secondary | ICD-10-CM

## 2021-04-08 DIAGNOSIS — E782 Mixed hyperlipidemia: Secondary | ICD-10-CM | POA: Diagnosis not present

## 2021-04-08 MED ORDER — RIVAROXABAN 10 MG PO TABS: 10.0000 mg | ORAL_TABLET | Freq: Every day | ORAL | 2 refills | Status: DC

## 2021-04-08 NOTE — Patient Instructions (Addendum)
Medication Instructions:  Your Physician recommend you continue on your current medication as directed.   *If you need a refill on your cardiac medications before your next appointment, please call your pharmacy*   Lab Work: None ordered today   Testing/Procedures: None ordered today    Follow-Up: At [facility], you and your health needs are our priority.  As part of our continuing mission to provide you with exceptional heart care, we have created designated Provider Care Teams.  These Care Teams include your primary Cardiologist (physician) and Advanced Practice Providers (APPs -  Physician Assistants and Nurse Practitioners) who all work together to provide you with the care you need, when you need it.  We recommend signing up for the patient portal called "MyChart".  Sign up information is provided on this After Visit Summary.  MyChart is used to connect with patients for Virtual Visits (Telemedicine).  Patients are able to view lab/test results, encounter notes, upcoming appointments, etc.  Non-urgent messages can be sent to your provider as well.   To learn more about what you can do with MyChart, go to [email or web address].    Your next appointment:   1 year(s)  The format for your next appointment:   In Person  Provider:   Dr. Marlou Porch   Other Instructions You may hold Xarelto two days prior to your endoscopy. This has been approved by Dr. Marlou Porch. You should resume after the endoscopy at the direction of Dr. Earlean Shawl.

## 2021-04-14 DIAGNOSIS — K2289 Other specified disease of esophagus: Secondary | ICD-10-CM | POA: Diagnosis not present

## 2021-04-14 DIAGNOSIS — K449 Diaphragmatic hernia without obstruction or gangrene: Secondary | ICD-10-CM | POA: Diagnosis not present

## 2021-04-14 DIAGNOSIS — R1310 Dysphagia, unspecified: Secondary | ICD-10-CM | POA: Diagnosis not present

## 2021-04-14 DIAGNOSIS — R1319 Other dysphagia: Secondary | ICD-10-CM | POA: Diagnosis not present

## 2021-04-14 DIAGNOSIS — K219 Gastro-esophageal reflux disease without esophagitis: Secondary | ICD-10-CM | POA: Diagnosis not present

## 2021-04-14 DIAGNOSIS — K222 Esophageal obstruction: Secondary | ICD-10-CM | POA: Diagnosis not present

## 2021-05-17 DIAGNOSIS — L940 Localized scleroderma [morphea]: Secondary | ICD-10-CM | POA: Diagnosis not present

## 2021-06-02 DIAGNOSIS — F4321 Adjustment disorder with depressed mood: Secondary | ICD-10-CM | POA: Diagnosis not present

## 2021-06-22 DIAGNOSIS — Z20822 Contact with and (suspected) exposure to covid-19: Secondary | ICD-10-CM | POA: Diagnosis not present

## 2021-06-28 DIAGNOSIS — F4321 Adjustment disorder with depressed mood: Secondary | ICD-10-CM | POA: Diagnosis not present

## 2021-06-29 ENCOUNTER — Telehealth: Payer: Self-pay | Admitting: Psychology

## 2021-06-29 ENCOUNTER — Encounter: Payer: Self-pay | Admitting: Psychology

## 2021-07-07 ENCOUNTER — Other Ambulatory Visit: Payer: Self-pay | Admitting: Internal Medicine

## 2021-07-07 DIAGNOSIS — F09 Unspecified mental disorder due to known physiological condition: Secondary | ICD-10-CM

## 2021-07-21 DIAGNOSIS — R400 Somnolence: Secondary | ICD-10-CM | POA: Diagnosis not present

## 2021-07-31 ENCOUNTER — Other Ambulatory Visit: Payer: Medicare Other

## 2021-08-09 DIAGNOSIS — G4733 Obstructive sleep apnea (adult) (pediatric): Secondary | ICD-10-CM | POA: Diagnosis not present

## 2021-08-10 DIAGNOSIS — I700 Atherosclerosis of aorta: Secondary | ICD-10-CM | POA: Diagnosis not present

## 2021-08-10 DIAGNOSIS — Z0000 Encounter for general adult medical examination without abnormal findings: Secondary | ICD-10-CM | POA: Diagnosis not present

## 2021-08-10 DIAGNOSIS — E7800 Pure hypercholesterolemia, unspecified: Secondary | ICD-10-CM | POA: Diagnosis not present

## 2021-08-10 DIAGNOSIS — D6869 Other thrombophilia: Secondary | ICD-10-CM | POA: Diagnosis not present

## 2021-08-10 DIAGNOSIS — Z86711 Personal history of pulmonary embolism: Secondary | ICD-10-CM | POA: Diagnosis not present

## 2021-08-10 DIAGNOSIS — K219 Gastro-esophageal reflux disease without esophagitis: Secondary | ICD-10-CM | POA: Diagnosis not present

## 2021-08-10 DIAGNOSIS — G4733 Obstructive sleep apnea (adult) (pediatric): Secondary | ICD-10-CM | POA: Diagnosis not present

## 2021-08-10 DIAGNOSIS — F09 Unspecified mental disorder due to known physiological condition: Secondary | ICD-10-CM | POA: Diagnosis not present

## 2021-08-10 DIAGNOSIS — E559 Vitamin D deficiency, unspecified: Secondary | ICD-10-CM | POA: Diagnosis not present

## 2021-08-10 DIAGNOSIS — I10 Essential (primary) hypertension: Secondary | ICD-10-CM | POA: Diagnosis not present

## 2021-08-10 DIAGNOSIS — Z86718 Personal history of other venous thrombosis and embolism: Secondary | ICD-10-CM | POA: Diagnosis not present

## 2021-08-10 DIAGNOSIS — F418 Other specified anxiety disorders: Secondary | ICD-10-CM | POA: Diagnosis not present

## 2021-08-10 DIAGNOSIS — Z1389 Encounter for screening for other disorder: Secondary | ICD-10-CM | POA: Diagnosis not present

## 2021-08-15 ENCOUNTER — Ambulatory Visit: Payer: Medicare Other | Admitting: Psychiatry

## 2021-08-16 DIAGNOSIS — F4321 Adjustment disorder with depressed mood: Secondary | ICD-10-CM | POA: Diagnosis not present

## 2021-08-16 DIAGNOSIS — R413 Other amnesia: Secondary | ICD-10-CM | POA: Diagnosis not present

## 2021-08-24 DIAGNOSIS — G4733 Obstructive sleep apnea (adult) (pediatric): Secondary | ICD-10-CM | POA: Diagnosis not present

## 2021-08-24 DIAGNOSIS — G4731 Primary central sleep apnea: Secondary | ICD-10-CM | POA: Diagnosis not present

## 2021-08-25 DIAGNOSIS — G4733 Obstructive sleep apnea (adult) (pediatric): Secondary | ICD-10-CM | POA: Diagnosis not present

## 2021-08-25 DIAGNOSIS — G4731 Primary central sleep apnea: Secondary | ICD-10-CM | POA: Diagnosis not present

## 2021-09-12 DIAGNOSIS — R413 Other amnesia: Secondary | ICD-10-CM | POA: Diagnosis not present

## 2021-09-14 ENCOUNTER — Ambulatory Visit: Payer: Medicare Other | Admitting: Psychiatry

## 2021-10-05 DIAGNOSIS — G4733 Obstructive sleep apnea (adult) (pediatric): Secondary | ICD-10-CM | POA: Diagnosis not present

## 2021-10-11 DIAGNOSIS — E559 Vitamin D deficiency, unspecified: Secondary | ICD-10-CM | POA: Diagnosis not present

## 2021-10-24 ENCOUNTER — Ambulatory Visit: Payer: Medicare Other | Admitting: Psychiatry

## 2021-10-31 DIAGNOSIS — F4321 Adjustment disorder with depressed mood: Secondary | ICD-10-CM | POA: Diagnosis not present

## 2021-11-11 DIAGNOSIS — F4321 Adjustment disorder with depressed mood: Secondary | ICD-10-CM | POA: Diagnosis not present

## 2021-11-14 DIAGNOSIS — F419 Anxiety disorder, unspecified: Secondary | ICD-10-CM | POA: Diagnosis not present

## 2021-11-14 DIAGNOSIS — G4733 Obstructive sleep apnea (adult) (pediatric): Secondary | ICD-10-CM | POA: Diagnosis not present

## 2021-11-14 DIAGNOSIS — G4731 Primary central sleep apnea: Secondary | ICD-10-CM | POA: Diagnosis not present

## 2021-11-14 DIAGNOSIS — R4189 Other symptoms and signs involving cognitive functions and awareness: Secondary | ICD-10-CM | POA: Diagnosis not present

## 2021-11-21 DIAGNOSIS — R0602 Shortness of breath: Secondary | ICD-10-CM | POA: Diagnosis not present

## 2021-11-24 DIAGNOSIS — G4733 Obstructive sleep apnea (adult) (pediatric): Secondary | ICD-10-CM | POA: Diagnosis not present

## 2021-12-19 DIAGNOSIS — G4733 Obstructive sleep apnea (adult) (pediatric): Secondary | ICD-10-CM | POA: Diagnosis not present

## 2021-12-22 ENCOUNTER — Other Ambulatory Visit (HOSPITAL_COMMUNITY): Payer: Self-pay | Admitting: Psychiatry

## 2021-12-22 DIAGNOSIS — R413 Other amnesia: Secondary | ICD-10-CM

## 2021-12-22 DIAGNOSIS — R410 Disorientation, unspecified: Secondary | ICD-10-CM

## 2021-12-26 ENCOUNTER — Telehealth: Payer: Self-pay | Admitting: Cardiology

## 2021-12-26 DIAGNOSIS — F4321 Adjustment disorder with depressed mood: Secondary | ICD-10-CM | POA: Diagnosis not present

## 2021-12-26 NOTE — Telephone Encounter (Signed)
Calling in regards to pt's EKG results. Please advise

## 2021-12-26 NOTE — Telephone Encounter (Signed)
Called Dr. Casimiro Needle back.  Someone has already spoken to him.  No further needs at this time.

## 2021-12-28 ENCOUNTER — Ambulatory Visit (HOSPITAL_COMMUNITY)
Admission: RE | Admit: 2021-12-28 | Discharge: 2021-12-28 | Disposition: A | Discharge status: Home / Self Care | Payer: Medicare Other | Source: Ambulatory Visit | Attending: Psychiatry | Admitting: Psychiatry

## 2021-12-28 DIAGNOSIS — R413 Other amnesia: Secondary | ICD-10-CM | POA: Diagnosis not present

## 2021-12-28 DIAGNOSIS — R410 Disorientation, unspecified: Secondary | ICD-10-CM | POA: Diagnosis not present

## 2021-12-28 NOTE — Progress Notes (Signed)
Cardiology Office Note:    Date:  12/30/2021   ID:  [name], DOB [birth year], MRN [id number]  PCP:  Lavone Orn, MD  [facility] Cardiologist:  Candee Furbish, MD  [facility] Electrophysiologist:  None   Referring MD: Lavone Orn, MD    History of Present Illness:    James Noble is a 81 y.o. male here for the follow-up of prior acute saddle pulmonary embolism 2021 on chronic Xarelto 10 mg, hypertension, right bundle branch block, recent memory impairment with recent increase in anxiety, new start nortriptyline by psychiatry late August 2023.  Here to monitor QTc interval.  He was seen by Laurann Montana, NP on 04/08/2021 for preoperative clearance for endoscopy. He reported occasional lightheadedness with position changes but was otherwise doing well. His EKG at that visit showed sinus rhythm 85 bpm with PAC and PVC.  Stable right bundle branch block.  Stable T wave inversion to lead III, aVF, V2, V3.  No acute ST/T wave changes.  Previous acute saddle pulmonary embolism left lower extremity DVT; hospitalized in January 2021. Xarelto was decreased to 10 mg once a day after full PE treatment for 1 year.   From prior review: Initially treated with IV heparin.  Interventional radiology critical care medicine, Dr. Haroldine Laws all discussed therapy.  Since nonocclusive, no evidence of right heart strain, IV heparin was utilized with transition to Xarelto.  An IVC filter was placed after left femoral vein to distal DVT discovered.  Given the large nature of the DVT and concurrent saddle emboli, could not afford potential migration on early treatment.   He did well.  Eventually became less short of breath.  Traversed the hallways of the hospital well.   Echocardiogram performed showed mildly dilated right ventricle but overall normal systolic function and normal left ventricular function.    He had his IVC filter removed on 08/12/2019 by Dr. Anselm Pancoast.  Repeat lower extremity Doppler showed no  evidence of DVT.  Abdominal CT was unremarkable.  Family history of CAD-father used to smoke.  I had been seeing previously for prevention efforts.  At his last appointment he was doing well and walking 4-5 miles with no shortness of breath or chest pain. He did have some minor dizziness upon standing.  Blood pressures may have been slightly low at home.  He had cut his losartan in half from 100 down to 50.   Today: He is accompanied by his wife. In clinic today his blood pressure is 100/60. He does not frequently monitor his BP at home. He occasionally feels dizzy especially when standing.  Last night he began taking the 1 mg dose of lorazepam. Currently he takes 1 mg lorazepam at night, and 0.5 mg during the day.   Lately his sleep quality has been terrible. It has been difficult for him to adjust to the BiPAP, so he stopped using it.  They report that at one time he was off of Crestor, but this has since been resumed. As of April his LDL was 182.  He remains compliant with Xarelto and denies any bleeding issues.  He denies any palpitations, chest pain, shortness of breath, or peripheral edema. No headaches, syncope, orthopnea, or PND.   Past Medical History:  Diagnosis Date   Cancer ([place])    BASAL CELL , DR.LOMAX   DVT (deep venous thrombosis) ([facility])    a. L sided DVT 05/2019, s/p IVC filter 05/08/19.   Hiatal hernia    Hx of colonic polyps  DR.MEDOFF   Hyperlipidemia    BASED ON NMR;GILBERTS SYNDROME   Hyperuricemia without signs inflammatory arthritis/tophaceous disease 2012   uric acid 7.7   Saddle pulmonary embolus ([place]) 05/2019   Sciatica    Spinal stenosis    OF LS SPINE...DR.LOVE    Past Surgical History:  Procedure Laterality Date   colonoscopy with polypectomy  2005 & 2008   Dr Earlean Shawl   HEMORRHOID SURGERY  1986   INGUINAL HERNIA REPAIR  1968   IR IVC FILTER PLMT / S&I /IMG GUID/MOD SED  05/08/2019   IR IVC FILTER RETRIEVAL / S&I /IMG GUID/MOD SED  08/12/2019   IR  RADIOLOGIST EVAL & MGMT  05/21/2019   IR RADIOLOGIST EVAL & MGMT  07/31/2019   steroid spinal injections  2013   X2, Dr Ernestina Patches   TONSILLECTOMY AND ADENOIDECTOMY  1949   UPPER GASTROINTESTINAL ENDOSCOPY  2008   Dr Earlean Shawl; Hiatal hernia   VASECTOMY      Current Medications: Current Meds  Medication Sig   acetaminophen (TYLENOL) 500 MG tablet Take 500 mg by mouth in the morning and at bedtime.   donepezil (ARICEPT) 5 MG tablet Take 5 mg by mouth at bedtime.   famotidine (PEPCID) 40 MG tablet Take 1 tablet by mouth at bedtime.   fluticasone (FLONASE) 50 MCG/ACT nasal spray Place 2 sprays into both nostrils at bedtime.   guaiFENesin (MUCINEX) 600 MG 12 hr tablet Take 600 mg by mouth 2 (two) times daily.   LORazepam (ATIVAN) 0.5 MG tablet Take 0.5 mg by mouth daily.   losartan (COZAAR) 50 MG tablet Take 50 mg by mouth daily.   nortriptyline (PAMELOR) 25 MG capsule Take 25 mg by mouth at bedtime.   rivaroxaban (XARELTO) 10 MG TABS tablet Take 1 tablet (10 mg total) by mouth daily.   rosuvastatin (CRESTOR) 20 MG tablet Take 20 mg by mouth daily.   [DISCONTINUED] amLODipine (NORVASC) 5 MG tablet Take 5 mg by mouth daily.     Allergies:   Dexlansoprazole and Nexium [esomeprazole magnesium]   Social History   Socioeconomic History   Marital status: Married    Spouse name: Not on file   Number of children: Not on file   Years of education: Not on file   Highest education level: Not on file  Occupational History   Occupation: ORTHODONTIST  Tobacco Use   Smoking status: Former    Types: Cigarettes    Quit date: 05/01/1968    Years since quitting: 53.7   Smokeless tobacco: Never   Tobacco comments:    smoked 1960-1970, up to 1 ppd  Substance and Sexual Activity   Alcohol use: Yes    Alcohol/week: 14.0 standard drinks of alcohol    Types: 14 Glasses of wine per week    Comment:  socially   Drug use: No   Sexual activity: Not on file  Other Topics Concern   Not on file  Social  History Narrative   HEART HEALTHY DIET   EXERCISES 4-6 TIME WEEKLY   Social Determinants of Health   Financial Resource Strain: Not on file  Food Insecurity: Not on file  Transportation Needs: Not on file  Physical Activity: Not on file  Stress: Not on file  Social Connections: Not on file     Family History: The patient's family history includes Heart attack (age of onset: 64) in his father; Stroke in his maternal grandfather, maternal grandmother, and maternal uncle; Stroke (age of onset: 73) in his mother. There  is no history of Diabetes.  ROS:   Please see the history of present illness.    (+) Occasional dizziness All other systems reviewed and are negative.  EKGs/Labs/Other Studies Reviewed:    The following studies were reviewed today:  Echocardiogram 05/07/2019 in the setting of acute PE:  1. Left ventricular ejection fraction, by visual estimation, is 55 to  60%. The left ventricle has normal function. There is moderately increased  left ventricular hypertrophy.   2. Moderate asymmetric hypertrophy measuring 9m in basal septum (950min  posterior wall)   3. Global right ventricle has normal systolic function.The right  ventricular size is mildly enlarged.   4. The mitral valve is normal in structure. No evidence of mitral valve  regurgitation.   5. The aortic valve is tricuspid. Aortic valve regurgitation is trivial.  Mild aortic valve sclerosis without stenosis.   6. The tricuspid valve is normal in structure.   7. The pulmonic valve was not well visualized. Pulmonic valve  regurgitation is trivial.   8. Left atrial size was normal.   9. Right atrial size was normal.  10. The inferior vena cava is normal in size with greater than 50%  respiratory variability, suggesting right atrial pressure of 3 mmHg.  11. TR signal is inadequate for assessing pulmonary artery systolic  pressure.  12. Thrombus seen at the bifurcation of the pulmonary arteries.   Nuclear  stress test 02/08/2017: Medium sized, moderate intensity fixed inferolateral perfusion defect with normal wall motion, suspicious of artifact. No reversible ischemia. LVEF 57%. This is a low risk study.  EKG: EKG is personally reviewed. 12/30/2021: Sinus rhythm. Rate 71 bpm. RBBB. Nonspecific t wave change. QTc 441 ms. Prior ECG 2022 showed QTc 445 ms. 01/29/2017: Sinus rhythm, right bundle branch block  Recent Labs: No results found for requested labs within last 365 days.   Recent Lipid Panel    Component Value Date/Time   CHOL 134 01/13/2016 0738   CHOL 138 10/22/2014 0737   TRIG 78.0 01/13/2016 0738   TRIG 115 10/22/2014 0737   TRIG 98 03/19/2006 0000   HDL 38.50 (L) 01/13/2016 0738   HDL 40 10/22/2014 0737   CHOLHDL 3 01/13/2016 0738   VLDL 15.6 01/13/2016 0738   LDLCALC 80 01/13/2016 0738   LDLCALC 75 10/22/2014 0737     Risk Assessment/Calculations:       Physical Exam:    VS:  BP 100/60 (BP Location: Left Arm, Patient Position: Sitting, Cuff Size: Normal)   Pulse 71   Ht '5\' 11"'$  (1.803 m)   Wt 199 lb (90.3 kg)   SpO2 95%   BMI 27.75 kg/m     Wt Readings from Last 3 Encounters:  12/30/21 199 lb (90.3 kg)  04/08/21 197 lb (89.4 kg)  03/08/20 192 lb (87.1 kg)     GEN:  Well nourished, well developed in no acute distress HEENT: Normal NECK: No JVD; No carotid bruits LYMPHATICS: No lymphadenopathy CARDIAC: RRR, no murmurs, rubs, gallops RESPIRATORY:  Clear to auscultation without rales, wheezing or rhonchi  ABDOMEN: Soft, non-tender, non-distended MUSCULOSKELETAL:  No edema; No deformity  SKIN: Warm and dry NEUROLOGIC:  Alert and oriented x 3 PSYCHIATRIC:  Normal affect   ASSESSMENT:    1. Essential hypertension   2. History of DVT (deep vein thrombosis)   3. RBBB   4. Chronic anticoagulation     PLAN:    In order of problems listed above:  PE DVT -Unprovoked May 07, 2019-on Xarelto. -  On Xarelto to 10 mg once a day, maintenance dose, after he  was treated with full PE treatment for 1 year.   -Reassuring DVT repeat study negative. -IVC filter removed Dr. Anselm Pancoast.  Chronic anticoagulation -Currently on Xarelto with hemoglobin and creatinine closely being monitored.  Hemoglobin 14.2 creatinine 1.0 from 11/21/2021.  Hyperlipidemia Coronary artery calcification-seen on CT scan 07/08/2019 -On Crestor 20 mg high intensity dose with LDL of 87 in the past.  Without Crestor LDL 182 in April 2023.   Has had a prior treadmill test that was reassuring.  He is not having any anginal symptoms.  Essential hypertension -Had some minor dizziness upon standing.  Blood pressures may have been slightly low at home.  He had cut his losartan in half from 100 down to 50.  I am actually going to discontinue his amlodipine 5 mg since his blood pressure today was 100/60.      Shared Decision Making/Informed Consent    Follow-up:  6 months.  Medication Adjustments/Labs and Tests Ordered: Current medicines are reviewed at length with the patient today.  Concerns regarding medicines are outlined above.   Orders Placed This Encounter  Procedures   EKG 12-Lead   No orders of the defined types were placed in this encounter.  Patient Instructions  Medication Instructions:  Please discontinue your Amlodipine.   Continue all other medications as listed.  *If you need a refill on your cardiac medications before your next appointment, please call your pharmacy*  Follow-Up: At [facility], you and your health needs are our priority.  As part of our continuing mission to provide you with exceptional heart care, we have created designated Provider Care Teams.  These Care Teams include your primary Cardiologist (physician) and Advanced Practice Providers (APPs -  Physician Assistants and Nurse Practitioners) who all work together to provide you with the care you need, when you need it.  We recommend signing up for the patient portal called "MyChart".   Sign up information is provided on this After Visit Summary.  MyChart is used to connect with patients for Virtual Visits (Telemedicine).  Patients are able to view lab/test results, encounter notes, upcoming appointments, etc.  Non-urgent messages can be sent to your provider as well.   To learn more about what you can do with MyChart, go to [email or web address].    Your next appointment:   6 month(s)  The format for your next appointment:   In Person  Provider:   Candee Furbish, MD      Important Information About Sugar         I,Mathew Stumpf,acting as a scribe for Candee Furbish, MD.,have documented all relevant documentation on the behalf of Candee Furbish, MD,as directed by  Candee Furbish, MD while in the presence of Candee Furbish, MD.  I, Candee Furbish, MD, have reviewed all documentation for this visit. The documentation on 12/30/21 for the exam, diagnosis, procedures, and orders are all accurate and complete.   Signed, Candee Furbish, MD  12/30/2021 11:19 AM    [facility]

## 2021-12-29 NOTE — Progress Notes (Signed)
EKG ORDER PUT IN BY ERROR

## 2021-12-30 ENCOUNTER — Encounter: Payer: Self-pay | Admitting: Cardiology

## 2021-12-30 ENCOUNTER — Ambulatory Visit: Payer: Medicare Other | Attending: Cardiology | Admitting: Cardiology

## 2021-12-30 VITALS — BP 100/60 | HR 71 | Ht 71.0 in | Wt 199.0 lb

## 2021-12-30 DIAGNOSIS — Z7901 Long term (current) use of anticoagulants: Secondary | ICD-10-CM

## 2021-12-30 DIAGNOSIS — I4510 Unspecified right bundle-branch block: Secondary | ICD-10-CM | POA: Diagnosis not present

## 2021-12-30 DIAGNOSIS — Z86718 Personal history of other venous thrombosis and embolism: Secondary | ICD-10-CM | POA: Diagnosis not present

## 2021-12-30 DIAGNOSIS — I10 Essential (primary) hypertension: Secondary | ICD-10-CM | POA: Insufficient documentation

## 2021-12-30 NOTE — Patient Instructions (Signed)
Medication Instructions:  Please discontinue your Amlodipine.   Continue all other medications as listed.  *If you need a refill on your cardiac medications before your next appointment, please call your pharmacy*  Follow-Up: At [facility], you and your health needs are our priority.  As part of our continuing mission to provide you with exceptional heart care, we have created designated Provider Care Teams.  These Care Teams include your primary Cardiologist (physician) and Advanced Practice Providers (APPs -  Physician Assistants and Nurse Practitioners) who all work together to provide you with the care you need, when you need it.  We recommend signing up for the patient portal called "MyChart".  Sign up information is provided on this After Visit Summary.  MyChart is used to connect with patients for Virtual Visits (Telemedicine).  Patients are able to view lab/test results, encounter notes, upcoming appointments, etc.  Non-urgent messages can be sent to your provider as well.   To learn more about what you can do with MyChart, go to [email or web address].    Your next appointment:   6 month(s)  The format for your next appointment:   In Person  Provider:   Candee Furbish, MD      Important Information About Sugar

## 2022-01-05 ENCOUNTER — Encounter: Payer: Medicare Other | Admitting: Psychology

## 2022-01-19 DIAGNOSIS — Z23 Encounter for immunization: Secondary | ICD-10-CM | POA: Diagnosis not present

## 2022-01-24 ENCOUNTER — Encounter (HOSPITAL_COMMUNITY): Payer: Self-pay

## 2022-01-24 ENCOUNTER — Emergency Department (HOSPITAL_COMMUNITY): Payer: Medicare Other

## 2022-01-24 ENCOUNTER — Emergency Department (HOSPITAL_COMMUNITY)
Admission: EM | Admit: 2022-01-24 | Discharge: 2022-01-25 | Discharge status: Against Medical Advice | Payer: Medicare Other | Attending: Physician Assistant | Admitting: Physician Assistant

## 2022-01-24 DIAGNOSIS — R410 Disorientation, unspecified: Secondary | ICD-10-CM | POA: Diagnosis not present

## 2022-01-24 DIAGNOSIS — Z7901 Long term (current) use of anticoagulants: Secondary | ICD-10-CM | POA: Insufficient documentation

## 2022-01-24 DIAGNOSIS — Z5321 Procedure and treatment not carried out due to patient leaving prior to being seen by health care provider: Secondary | ICD-10-CM | POA: Diagnosis not present

## 2022-01-24 DIAGNOSIS — I498 Other specified cardiac arrhythmias: Secondary | ICD-10-CM | POA: Insufficient documentation

## 2022-01-24 DIAGNOSIS — G3184 Mild cognitive impairment, so stated: Secondary | ICD-10-CM | POA: Insufficient documentation

## 2022-01-24 DIAGNOSIS — R4182 Altered mental status, unspecified: Secondary | ICD-10-CM | POA: Diagnosis not present

## 2022-01-24 LAB — COMPREHENSIVE METABOLIC PANEL
ALT: 31 U/L (ref 0–44)
AST: 26 U/L (ref 15–41)
Albumin: 4.2 g/dL (ref 3.5–5.0)
Alkaline Phosphatase: 57 U/L (ref 38–126)
Anion gap: 5 (ref 5–15)
BUN: 13 mg/dL (ref 8–23)
CO2: 25 mmol/L (ref 22–32)
Calcium: 9.1 mg/dL (ref 8.9–10.3)
Chloride: 109 mmol/L (ref 98–111)
Creatinine, Ser: 0.96 mg/dL (ref 0.61–1.24)
GFR, Estimated: >60 mL/min (ref >60)
Glucose, Bld: 102 mg/dL — ABNORMAL HIGH (ref 70–99)
Potassium: 4.6 mmol/L (ref 3.5–5.1)
Sodium: 139 mmol/L (ref 135–145)
Total Bilirubin: 0.6 mg/dL (ref 0.3–1.2)
Total Protein: 7.7 g/dL (ref 6.5–8.1)

## 2022-01-24 LAB — CBG MONITORING, ED: Glucose-Capillary: 98 mg/dL (ref 70–99)

## 2022-01-24 LAB — CBC
HCT: 43.5 % (ref 39.0–52.0)
Hemoglobin: 14.6 g/dL (ref 13.0–17.0)
MCH: 35.3 pg — ABNORMAL HIGH (ref 26.0–34.0)
MCHC: 33.6 g/dL (ref 30.0–36.0)
MCV: 105.1 fL — ABNORMAL HIGH (ref 80.0–100.0)
Platelets: 170 10*3/uL (ref 150–400)
RBC: 4.14 MIL/uL — ABNORMAL LOW (ref 4.22–5.81)
RDW: 12 % (ref 11.5–15.5)
WBC: 5.3 10*3/uL (ref 4.0–10.5)
nRBC: 0 % (ref 0.0–0.2)

## 2022-01-24 LAB — PROTIME-INR
INR: 1.2 (ref 0.8–1.2)
Prothrombin Time: 15.5 seconds — ABNORMAL HIGH (ref 11.4–15.2)
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
## 2022-02-02 IMAGING — CT CT ABD-PELV W/ CM
1 of 3 series · 14 of 32 positions shown, 19 images · IV contrast (APPLIED)
Comparison: None.

CLINICAL DATA: Recent pulmonary embolism. Evaluate for occult
malignancy.

Creatinine was obtained on site at [HOSPITAL] at [HOSPITAL].
Results: Creatinine 0.9 mg/dL.
EXAM:
CT ABDOMEN AND PELVIS WITH CONTRAST
TECHNIQUE: Multidetector CT imaging of the abdomen and pelvis was performed
using the standard protocol following bolus administration of
intravenous contrast.
CONTRAST:  100mL P4VIQU-522 IOPAMIDOL (P4VIQU-522) INJECTION 61%

[Series 2: abd/pelvis w/cm · axial · 0.81mm/px · z∈[-501,-81]mm · 14 of 96 slices shown, 19 images]
[im 6/96  soft-tissue]
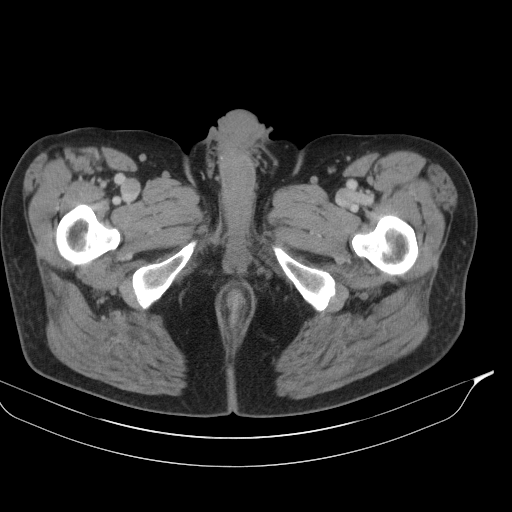
[im 6/96  bone]
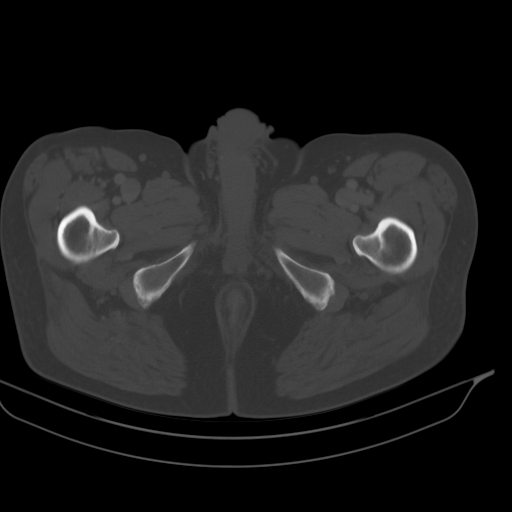
[im 12/96  soft-tissue]
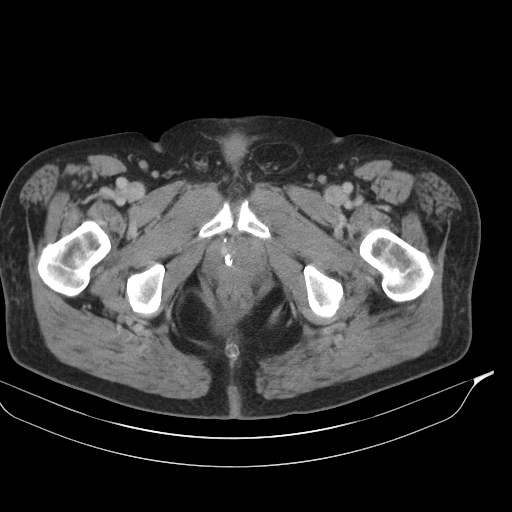
[im 18/96  soft-tissue]
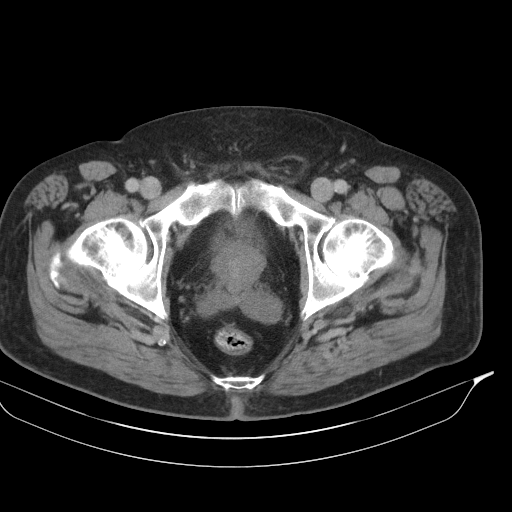
[im 30/96  soft-tissue]
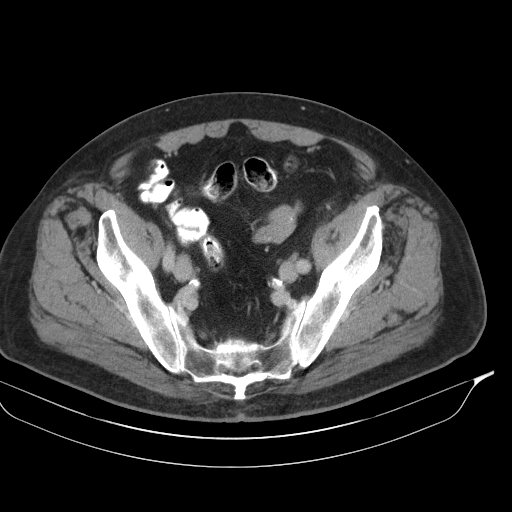
[im 36/96  soft-tissue]
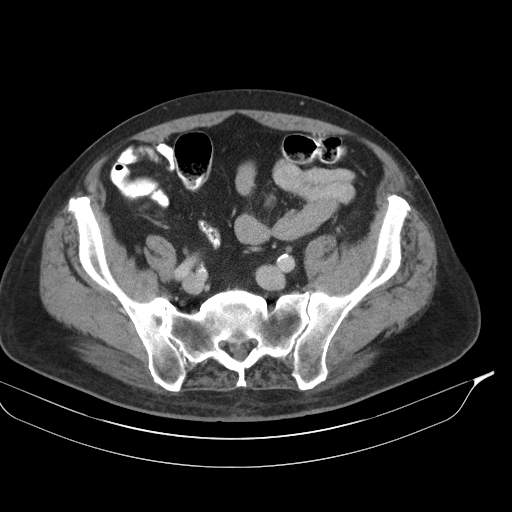
[im 42/96  soft-tissue]
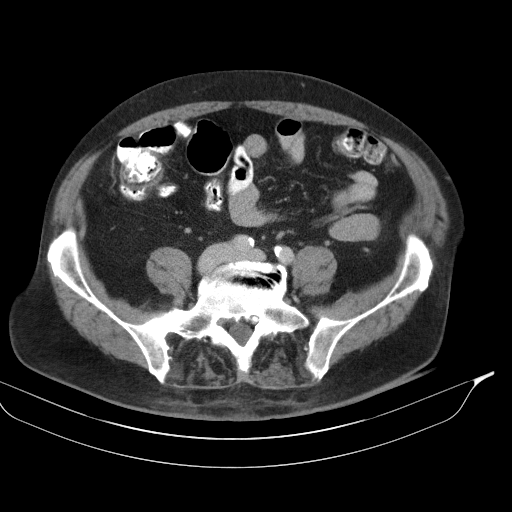
[im 48/96  soft-tissue]
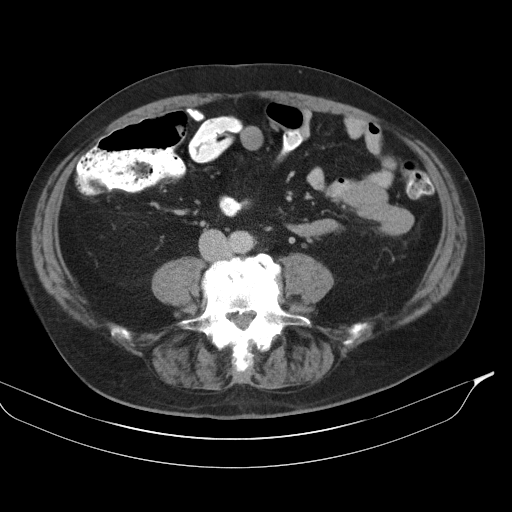
[im 54/96  soft-tissue]
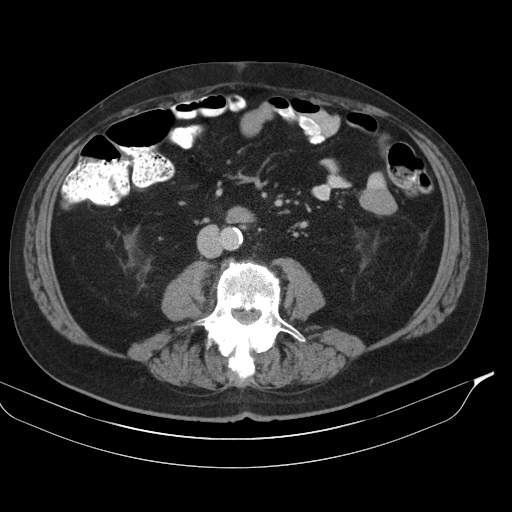
[im 60/96  soft-tissue]
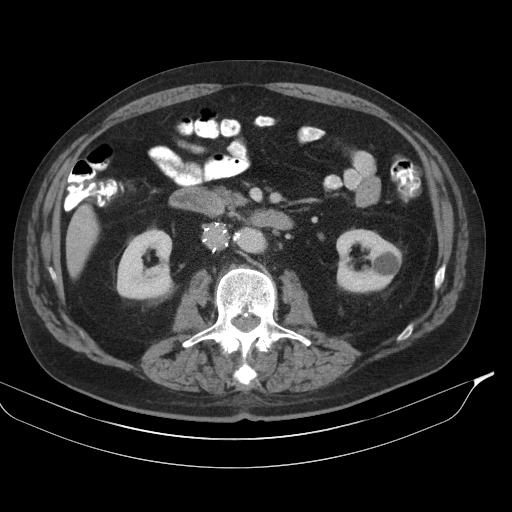
[im 60/96  bone]
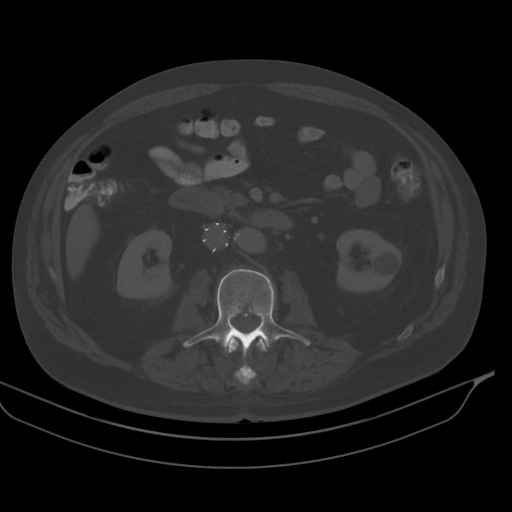
[im 66/96  soft-tissue]
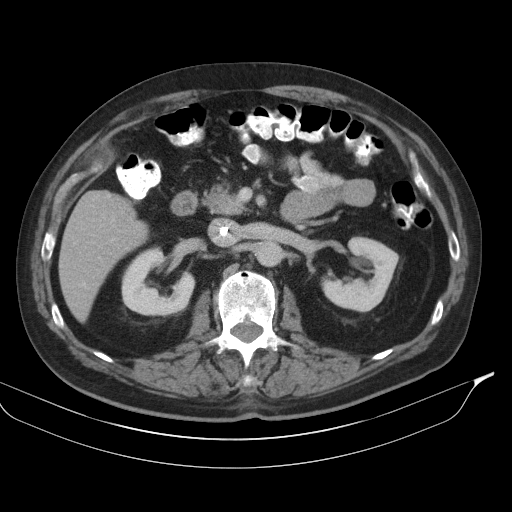
[im 72/96  lung]
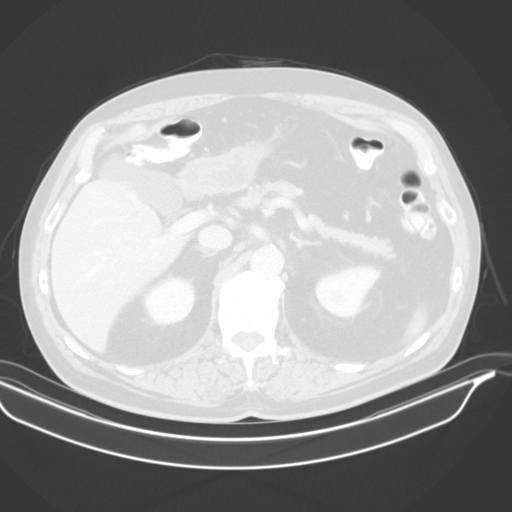
[im 78/96  soft-tissue]
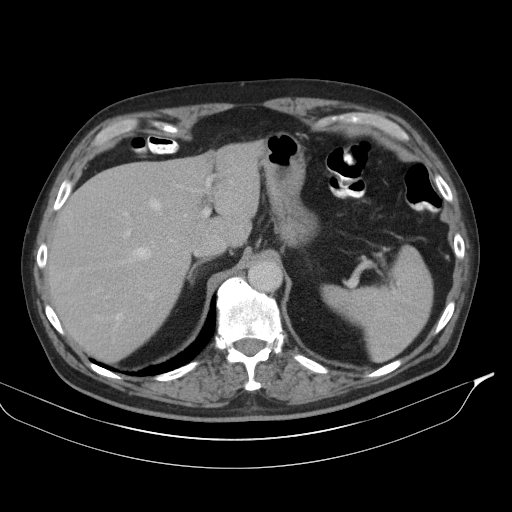
[im 78/96  lung]
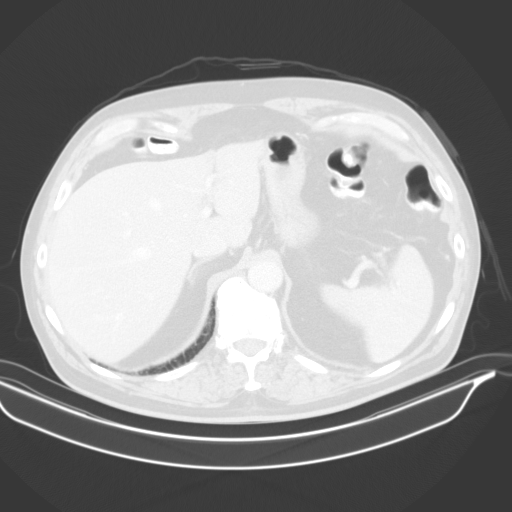
[im 84/96  soft-tissue]
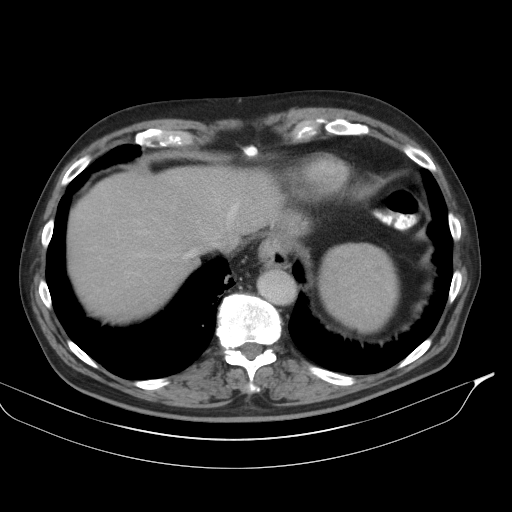
[im 84/96  lung]
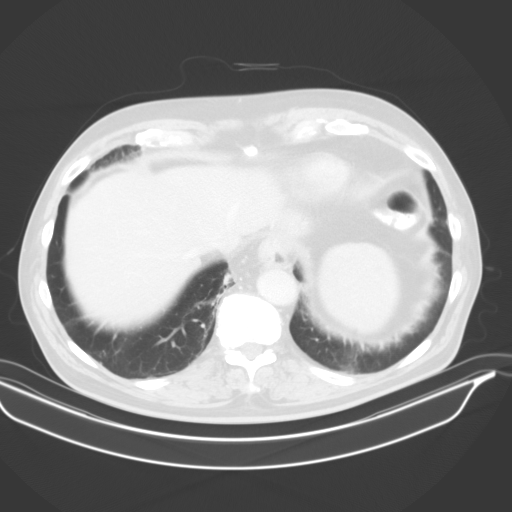
[im 90/96  soft-tissue]
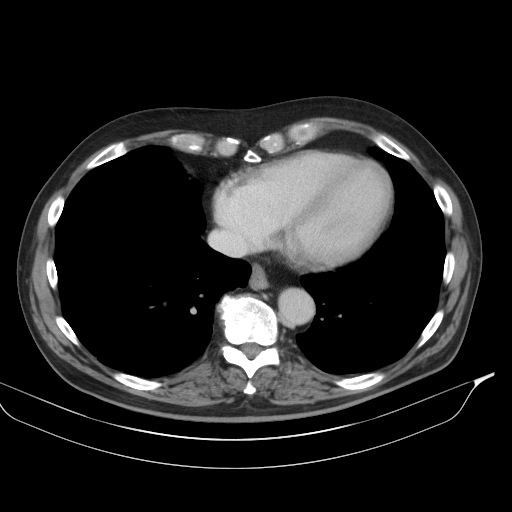
[im 90/96  lung]
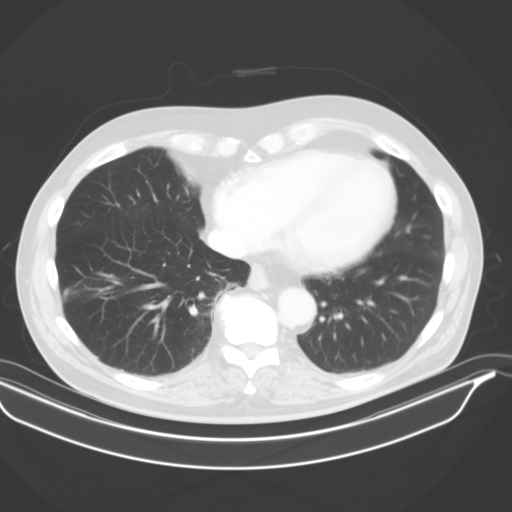

[14 of 32 positions shown; findings below may reference images not displayed]

FINDINGS: Lower Chest: No acute findings.

Hepatobiliary: No hepatic masses identified. Gallbladder is
unremarkable. No evidence of biliary ductal dilatation.

Pancreas:  No mass or inflammatory changes.

Spleen: Within normal limits in size and appearance.

Adrenals/Urinary Tract: No masses identified. Small left renal cyst
noted. No evidence of ureteral calculi or hydronephrosis.

Stomach/Bowel: No evidence of obstruction, inflammatory process or
abnormal fluid collections.

Vascular/Lymphatic: No pathologically enlarged lymph nodes. No
abdominal aortic aneurysm. Aortic atherosclerosis incidentally
noted. IVC filter seen in appropriate position.

Reproductive:  Mildly enlarged prostate.

Other:  None.

Musculoskeletal:  No suspicious bone lesions identified.
IMPRESSION: 1. No evidence of neoplasm or other acute findings within the
abdomen or pelvis.
2. Mildly enlarged prostate.

## 2022-02-06 ENCOUNTER — Encounter: Payer: Medicare Other | Admitting: Psychology

## 2022-02-06 DIAGNOSIS — H524 Presbyopia: Secondary | ICD-10-CM | POA: Diagnosis not present

## 2022-02-06 DIAGNOSIS — H401131 Primary open-angle glaucoma, bilateral, mild stage: Secondary | ICD-10-CM | POA: Diagnosis not present

## 2022-02-06 DIAGNOSIS — H52203 Unspecified astigmatism, bilateral: Secondary | ICD-10-CM | POA: Diagnosis not present

## 2022-02-06 DIAGNOSIS — H25813 Combined forms of age-related cataract, bilateral: Secondary | ICD-10-CM | POA: Diagnosis not present

## 2022-02-14 DIAGNOSIS — F4321 Adjustment disorder with depressed mood: Secondary | ICD-10-CM | POA: Diagnosis not present

## 2022-02-14 DIAGNOSIS — R42 Dizziness and giddiness: Secondary | ICD-10-CM | POA: Diagnosis not present

## 2022-02-14 DIAGNOSIS — Z5181 Encounter for therapeutic drug level monitoring: Secondary | ICD-10-CM | POA: Diagnosis not present

## 2022-02-14 DIAGNOSIS — M7662 Achilles tendinitis, left leg: Secondary | ICD-10-CM | POA: Diagnosis not present

## 2022-02-21 DIAGNOSIS — Z79899 Other long term (current) drug therapy: Secondary | ICD-10-CM | POA: Diagnosis not present

## 2022-03-09 DIAGNOSIS — H401131 Primary open-angle glaucoma, bilateral, mild stage: Secondary | ICD-10-CM | POA: Diagnosis not present

## 2022-03-29 ENCOUNTER — Other Ambulatory Visit: Payer: Self-pay

## 2022-03-29 DIAGNOSIS — Z86718 Personal history of other venous thrombosis and embolism: Secondary | ICD-10-CM

## 2022-03-29 MED ORDER — RIVAROXABAN 10 MG PO TABS: 10.0000 mg | ORAL_TABLET | Freq: Every day | ORAL | 2 refills | Status: AC

## 2022-03-29 NOTE — Telephone Encounter (Signed)
Prescription refill request for Xarelto received.  Indication:dvt Last office visit:9/23 Weight:90.3 kg Age:81 Scr:0.9 CrCl:85 ml/min  Prescription refilled

## 2022-04-06 DIAGNOSIS — F4321 Adjustment disorder with depressed mood: Secondary | ICD-10-CM | POA: Diagnosis not present

## 2022-05-03 DIAGNOSIS — R55 Syncope and collapse: Secondary | ICD-10-CM | POA: Diagnosis not present

## 2022-05-03 DIAGNOSIS — I951 Orthostatic hypotension: Secondary | ICD-10-CM | POA: Diagnosis not present

## 2022-05-10 DIAGNOSIS — R42 Dizziness and giddiness: Secondary | ICD-10-CM | POA: Diagnosis not present

## 2022-05-10 DIAGNOSIS — I951 Orthostatic hypotension: Secondary | ICD-10-CM | POA: Diagnosis not present

## 2022-05-10 DIAGNOSIS — M545 Low back pain, unspecified: Secondary | ICD-10-CM | POA: Diagnosis not present

## 2022-05-30 DIAGNOSIS — F4321 Adjustment disorder with depressed mood: Secondary | ICD-10-CM | POA: Diagnosis not present

## 2022-06-01 DIAGNOSIS — M5459 Other low back pain: Secondary | ICD-10-CM | POA: Diagnosis not present

## 2022-06-01 DIAGNOSIS — M25572 Pain in left ankle and joints of left foot: Secondary | ICD-10-CM | POA: Diagnosis not present

## 2022-06-01 DIAGNOSIS — I951 Orthostatic hypotension: Secondary | ICD-10-CM | POA: Diagnosis not present

## 2022-06-06 DIAGNOSIS — I951 Orthostatic hypotension: Secondary | ICD-10-CM | POA: Diagnosis not present

## 2022-06-06 DIAGNOSIS — M25572 Pain in left ankle and joints of left foot: Secondary | ICD-10-CM | POA: Diagnosis not present

## 2022-06-06 DIAGNOSIS — M5459 Other low back pain: Secondary | ICD-10-CM | POA: Diagnosis not present

## 2022-06-08 DIAGNOSIS — I951 Orthostatic hypotension: Secondary | ICD-10-CM | POA: Diagnosis not present

## 2022-06-08 DIAGNOSIS — M5459 Other low back pain: Secondary | ICD-10-CM | POA: Diagnosis not present

## 2022-06-08 DIAGNOSIS — M25572 Pain in left ankle and joints of left foot: Secondary | ICD-10-CM | POA: Diagnosis not present

## 2022-06-14 DIAGNOSIS — M25572 Pain in left ankle and joints of left foot: Secondary | ICD-10-CM | POA: Diagnosis not present

## 2022-06-14 DIAGNOSIS — M5459 Other low back pain: Secondary | ICD-10-CM | POA: Diagnosis not present

## 2022-06-14 DIAGNOSIS — I951 Orthostatic hypotension: Secondary | ICD-10-CM | POA: Diagnosis not present

## 2022-06-16 DIAGNOSIS — M5459 Other low back pain: Secondary | ICD-10-CM | POA: Diagnosis not present

## 2022-06-16 DIAGNOSIS — M25572 Pain in left ankle and joints of left foot: Secondary | ICD-10-CM | POA: Diagnosis not present

## 2022-06-16 DIAGNOSIS — I951 Orthostatic hypotension: Secondary | ICD-10-CM | POA: Diagnosis not present

## 2022-06-20 DIAGNOSIS — M25572 Pain in left ankle and joints of left foot: Secondary | ICD-10-CM | POA: Diagnosis not present

## 2022-06-20 DIAGNOSIS — I951 Orthostatic hypotension: Secondary | ICD-10-CM | POA: Diagnosis not present

## 2022-06-20 DIAGNOSIS — M5459 Other low back pain: Secondary | ICD-10-CM | POA: Diagnosis not present

## 2022-06-22 DIAGNOSIS — M25572 Pain in left ankle and joints of left foot: Secondary | ICD-10-CM | POA: Diagnosis not present

## 2022-06-22 DIAGNOSIS — I951 Orthostatic hypotension: Secondary | ICD-10-CM | POA: Diagnosis not present

## 2022-06-22 DIAGNOSIS — M5459 Other low back pain: Secondary | ICD-10-CM | POA: Diagnosis not present

## 2022-06-27 ENCOUNTER — Ambulatory Visit: Payer: Medicare Other | Admitting: Cardiology

## 2022-06-28 DIAGNOSIS — M25572 Pain in left ankle and joints of left foot: Secondary | ICD-10-CM | POA: Diagnosis not present

## 2022-06-28 DIAGNOSIS — I951 Orthostatic hypotension: Secondary | ICD-10-CM | POA: Diagnosis not present

## 2022-06-28 DIAGNOSIS — M5459 Other low back pain: Secondary | ICD-10-CM | POA: Diagnosis not present

## 2022-06-30 DIAGNOSIS — I951 Orthostatic hypotension: Secondary | ICD-10-CM | POA: Diagnosis not present

## 2022-06-30 DIAGNOSIS — M25572 Pain in left ankle and joints of left foot: Secondary | ICD-10-CM | POA: Diagnosis not present

## 2022-06-30 DIAGNOSIS — M5459 Other low back pain: Secondary | ICD-10-CM | POA: Diagnosis not present

## 2022-07-04 ENCOUNTER — Other Ambulatory Visit: Payer: Self-pay | Admitting: *Deleted

## 2022-07-04 DIAGNOSIS — M5459 Other low back pain: Secondary | ICD-10-CM | POA: Diagnosis not present

## 2022-07-04 DIAGNOSIS — I951 Orthostatic hypotension: Secondary | ICD-10-CM | POA: Diagnosis not present

## 2022-07-04 DIAGNOSIS — M25572 Pain in left ankle and joints of left foot: Secondary | ICD-10-CM | POA: Diagnosis not present

## 2022-07-04 NOTE — Patient Outreach (Signed)
Late entry for 07/03/22. Per [facility] Mr. Derman resides in [facility]. Screening for potential care coordination services as benefit of health plan and PCP.   PCP office [place] at [facility] has [facility] care management.   Secure communication sent to [facility], [employer] social worker to make aware Probation officer is following for transition plans/needs.   Marthenia Rolling, MSN, RN,BSN [place] Acute Care Coordinator [phone] (Direct dial)

## 2022-07-06 DIAGNOSIS — M25572 Pain in left ankle and joints of left foot: Secondary | ICD-10-CM | POA: Diagnosis not present

## 2022-07-06 DIAGNOSIS — M5459 Other low back pain: Secondary | ICD-10-CM | POA: Diagnosis not present

## 2022-07-06 DIAGNOSIS — I951 Orthostatic hypotension: Secondary | ICD-10-CM | POA: Diagnosis not present

## 2022-07-11 DIAGNOSIS — I951 Orthostatic hypotension: Secondary | ICD-10-CM | POA: Diagnosis not present

## 2022-07-11 DIAGNOSIS — M5459 Other low back pain: Secondary | ICD-10-CM | POA: Diagnosis not present

## 2022-07-11 DIAGNOSIS — M25572 Pain in left ankle and joints of left foot: Secondary | ICD-10-CM | POA: Diagnosis not present

## 2022-07-13 DIAGNOSIS — I951 Orthostatic hypotension: Secondary | ICD-10-CM | POA: Diagnosis not present

## 2022-07-13 DIAGNOSIS — M5459 Other low back pain: Secondary | ICD-10-CM | POA: Diagnosis not present

## 2022-07-13 DIAGNOSIS — M25572 Pain in left ankle and joints of left foot: Secondary | ICD-10-CM | POA: Diagnosis not present

## 2022-07-14 ENCOUNTER — Other Ambulatory Visit: Payer: Self-pay | Admitting: *Deleted

## 2022-07-14 NOTE — Patient Outreach (Signed)
[place] Coordinator follow up. Screening for potential [place] care coordination services as benefit of health plan and Primary Care Provider. Per [facility] Mr. Razzak resides in [place].  Confirmed with Therapist, nutritional, Mr. Taub resides in [place].   No identifiable [place] care coordination needs.   Marthenia Rolling, MSN, RN,BSN [place] Acute Care Coordinator [phone] (Direct dial)

## 2022-08-02 DIAGNOSIS — F0394 Unspecified dementia, unspecified severity, with anxiety: Secondary | ICD-10-CM

## 2022-08-14 DIAGNOSIS — F331 Major depressive disorder, recurrent, moderate: Secondary | ICD-10-CM | POA: Diagnosis not present

## 2022-08-14 DIAGNOSIS — G4700 Insomnia, unspecified: Secondary | ICD-10-CM | POA: Diagnosis not present

## 2022-08-14 DIAGNOSIS — F03C11 Unspecified dementia, severe, with agitation: Secondary | ICD-10-CM | POA: Diagnosis not present

## 2022-08-15 DIAGNOSIS — F0394 Unspecified dementia, unspecified severity, with anxiety: Secondary | ICD-10-CM | POA: Diagnosis not present

## 2022-08-25 DIAGNOSIS — F331 Major depressive disorder, recurrent, moderate: Secondary | ICD-10-CM | POA: Diagnosis not present

## 2022-08-28 DIAGNOSIS — F03C11 Unspecified dementia, severe, with agitation: Secondary | ICD-10-CM | POA: Diagnosis not present

## 2022-08-28 DIAGNOSIS — F02C11 Dementia in other diseases classified elsewhere, severe, with agitation: Secondary | ICD-10-CM | POA: Diagnosis not present

## 2022-08-28 DIAGNOSIS — R451 Restlessness and agitation: Secondary | ICD-10-CM | POA: Diagnosis not present

## 2022-09-11 DIAGNOSIS — F03C11 Unspecified dementia, severe, with agitation: Secondary | ICD-10-CM | POA: Diagnosis not present

## 2022-09-11 DIAGNOSIS — G4700 Insomnia, unspecified: Secondary | ICD-10-CM | POA: Diagnosis not present

## 2022-09-11 DIAGNOSIS — R451 Restlessness and agitation: Secondary | ICD-10-CM | POA: Diagnosis not present

## 2022-09-11 DIAGNOSIS — F02C11 Dementia in other diseases classified elsewhere, severe, with agitation: Secondary | ICD-10-CM | POA: Diagnosis not present

## 2022-09-11 DIAGNOSIS — F331 Major depressive disorder, recurrent, moderate: Secondary | ICD-10-CM | POA: Diagnosis not present

## 2022-09-18 DIAGNOSIS — F339 Major depressive disorder, recurrent, unspecified: Secondary | ICD-10-CM | POA: Diagnosis not present

## 2022-10-09 DIAGNOSIS — F339 Major depressive disorder, recurrent, unspecified: Secondary | ICD-10-CM | POA: Diagnosis not present

## 2022-10-09 DIAGNOSIS — G4700 Insomnia, unspecified: Secondary | ICD-10-CM | POA: Diagnosis not present

## 2022-10-09 DIAGNOSIS — F03C11 Unspecified dementia, severe, with agitation: Secondary | ICD-10-CM | POA: Diagnosis not present

## 2022-10-31 DIAGNOSIS — M542 Cervicalgia: Secondary | ICD-10-CM | POA: Diagnosis not present

## 2022-10-31 DIAGNOSIS — M2120 Flexion deformity, unspecified site: Secondary | ICD-10-CM | POA: Diagnosis not present

## 2022-11-01 DIAGNOSIS — F321 Major depressive disorder, single episode, moderate: Secondary | ICD-10-CM | POA: Diagnosis not present

## 2022-11-01 DIAGNOSIS — F0390 Unspecified dementia without behavioral disturbance: Secondary | ICD-10-CM | POA: Diagnosis not present

## 2022-11-05 DIAGNOSIS — M2120 Flexion deformity, unspecified site: Secondary | ICD-10-CM | POA: Diagnosis not present

## 2022-11-05 DIAGNOSIS — M542 Cervicalgia: Secondary | ICD-10-CM | POA: Diagnosis not present

## 2022-11-06 DIAGNOSIS — G4700 Insomnia, unspecified: Secondary | ICD-10-CM | POA: Diagnosis not present

## 2022-11-06 DIAGNOSIS — F02C11 Dementia in other diseases classified elsewhere, severe, with agitation: Secondary | ICD-10-CM | POA: Diagnosis not present

## 2022-11-06 DIAGNOSIS — F03C11 Unspecified dementia, severe, with agitation: Secondary | ICD-10-CM | POA: Diagnosis not present

## 2022-11-09 DIAGNOSIS — M2120 Flexion deformity, unspecified site: Secondary | ICD-10-CM | POA: Diagnosis not present

## 2022-11-09 DIAGNOSIS — M542 Cervicalgia: Secondary | ICD-10-CM | POA: Diagnosis not present

## 2022-11-13 DIAGNOSIS — M2120 Flexion deformity, unspecified site: Secondary | ICD-10-CM | POA: Diagnosis not present

## 2022-11-13 DIAGNOSIS — M542 Cervicalgia: Secondary | ICD-10-CM | POA: Diagnosis not present

## 2022-11-16 DIAGNOSIS — M542 Cervicalgia: Secondary | ICD-10-CM | POA: Diagnosis not present

## 2022-11-16 DIAGNOSIS — M2120 Flexion deformity, unspecified site: Secondary | ICD-10-CM | POA: Diagnosis not present

## 2022-11-20 DIAGNOSIS — M2120 Flexion deformity, unspecified site: Secondary | ICD-10-CM | POA: Diagnosis not present

## 2022-11-20 DIAGNOSIS — M542 Cervicalgia: Secondary | ICD-10-CM | POA: Diagnosis not present

## 2022-11-23 DIAGNOSIS — M542 Cervicalgia: Secondary | ICD-10-CM | POA: Diagnosis not present

## 2022-11-23 DIAGNOSIS — M2120 Flexion deformity, unspecified site: Secondary | ICD-10-CM | POA: Diagnosis not present

## 2022-11-30 DIAGNOSIS — F802 Mixed receptive-expressive language disorder: Secondary | ICD-10-CM | POA: Diagnosis not present

## 2022-11-30 DIAGNOSIS — F0390 Unspecified dementia without behavioral disturbance: Secondary | ICD-10-CM | POA: Diagnosis not present

## 2022-11-30 DIAGNOSIS — M542 Cervicalgia: Secondary | ICD-10-CM | POA: Diagnosis not present

## 2022-11-30 DIAGNOSIS — M2120 Flexion deformity, unspecified site: Secondary | ICD-10-CM | POA: Diagnosis not present

## 2022-11-30 DIAGNOSIS — R41841 Cognitive communication deficit: Secondary | ICD-10-CM | POA: Diagnosis not present

## 2022-12-04 DIAGNOSIS — F5105 Insomnia due to other mental disorder: Secondary | ICD-10-CM | POA: Diagnosis not present

## 2022-12-04 DIAGNOSIS — F02818 Dementia in other diseases classified elsewhere, unspecified severity, with other behavioral disturbance: Secondary | ICD-10-CM | POA: Diagnosis not present

## 2022-12-04 DIAGNOSIS — F411 Generalized anxiety disorder: Secondary | ICD-10-CM | POA: Diagnosis not present

## 2022-12-05 DIAGNOSIS — M542 Cervicalgia: Secondary | ICD-10-CM | POA: Diagnosis not present

## 2022-12-05 DIAGNOSIS — F0390 Unspecified dementia without behavioral disturbance: Secondary | ICD-10-CM | POA: Diagnosis not present

## 2022-12-05 DIAGNOSIS — R41841 Cognitive communication deficit: Secondary | ICD-10-CM | POA: Diagnosis not present

## 2022-12-05 DIAGNOSIS — M2120 Flexion deformity, unspecified site: Secondary | ICD-10-CM | POA: Diagnosis not present

## 2022-12-05 DIAGNOSIS — F802 Mixed receptive-expressive language disorder: Secondary | ICD-10-CM | POA: Diagnosis not present

## 2022-12-06 DIAGNOSIS — F0390 Unspecified dementia without behavioral disturbance: Secondary | ICD-10-CM | POA: Diagnosis not present

## 2022-12-06 DIAGNOSIS — M542 Cervicalgia: Secondary | ICD-10-CM | POA: Diagnosis not present

## 2022-12-06 DIAGNOSIS — F802 Mixed receptive-expressive language disorder: Secondary | ICD-10-CM | POA: Diagnosis not present

## 2022-12-06 DIAGNOSIS — M2120 Flexion deformity, unspecified site: Secondary | ICD-10-CM | POA: Diagnosis not present

## 2022-12-06 DIAGNOSIS — R41841 Cognitive communication deficit: Secondary | ICD-10-CM | POA: Diagnosis not present

## 2022-12-07 DIAGNOSIS — M542 Cervicalgia: Secondary | ICD-10-CM | POA: Diagnosis not present

## 2022-12-07 DIAGNOSIS — R41841 Cognitive communication deficit: Secondary | ICD-10-CM | POA: Diagnosis not present

## 2022-12-07 DIAGNOSIS — F0390 Unspecified dementia without behavioral disturbance: Secondary | ICD-10-CM | POA: Diagnosis not present

## 2022-12-07 DIAGNOSIS — F802 Mixed receptive-expressive language disorder: Secondary | ICD-10-CM | POA: Diagnosis not present

## 2022-12-07 DIAGNOSIS — M2120 Flexion deformity, unspecified site: Secondary | ICD-10-CM | POA: Diagnosis not present

## 2022-12-09 DIAGNOSIS — M542 Cervicalgia: Secondary | ICD-10-CM | POA: Diagnosis not present

## 2022-12-09 DIAGNOSIS — R41841 Cognitive communication deficit: Secondary | ICD-10-CM | POA: Diagnosis not present

## 2022-12-09 DIAGNOSIS — F802 Mixed receptive-expressive language disorder: Secondary | ICD-10-CM | POA: Diagnosis not present

## 2022-12-09 DIAGNOSIS — M2120 Flexion deformity, unspecified site: Secondary | ICD-10-CM | POA: Diagnosis not present

## 2022-12-09 DIAGNOSIS — F0390 Unspecified dementia without behavioral disturbance: Secondary | ICD-10-CM | POA: Diagnosis not present

## 2022-12-11 DIAGNOSIS — R41841 Cognitive communication deficit: Secondary | ICD-10-CM | POA: Diagnosis not present

## 2022-12-11 DIAGNOSIS — F802 Mixed receptive-expressive language disorder: Secondary | ICD-10-CM | POA: Diagnosis not present

## 2022-12-11 DIAGNOSIS — M2120 Flexion deformity, unspecified site: Secondary | ICD-10-CM | POA: Diagnosis not present

## 2022-12-11 DIAGNOSIS — M542 Cervicalgia: Secondary | ICD-10-CM | POA: Diagnosis not present

## 2022-12-11 DIAGNOSIS — F0390 Unspecified dementia without behavioral disturbance: Secondary | ICD-10-CM | POA: Diagnosis not present

## 2022-12-12 DIAGNOSIS — M2120 Flexion deformity, unspecified site: Secondary | ICD-10-CM | POA: Diagnosis not present

## 2022-12-12 DIAGNOSIS — R41841 Cognitive communication deficit: Secondary | ICD-10-CM | POA: Diagnosis not present

## 2022-12-12 DIAGNOSIS — F802 Mixed receptive-expressive language disorder: Secondary | ICD-10-CM | POA: Diagnosis not present

## 2022-12-12 DIAGNOSIS — M542 Cervicalgia: Secondary | ICD-10-CM | POA: Diagnosis not present

## 2022-12-12 DIAGNOSIS — F0390 Unspecified dementia without behavioral disturbance: Secondary | ICD-10-CM | POA: Diagnosis not present

## 2022-12-15 DIAGNOSIS — M2120 Flexion deformity, unspecified site: Secondary | ICD-10-CM | POA: Diagnosis not present

## 2022-12-15 DIAGNOSIS — M542 Cervicalgia: Secondary | ICD-10-CM | POA: Diagnosis not present

## 2022-12-15 DIAGNOSIS — F0390 Unspecified dementia without behavioral disturbance: Secondary | ICD-10-CM | POA: Diagnosis not present

## 2022-12-15 DIAGNOSIS — F802 Mixed receptive-expressive language disorder: Secondary | ICD-10-CM | POA: Diagnosis not present

## 2022-12-15 DIAGNOSIS — R41841 Cognitive communication deficit: Secondary | ICD-10-CM | POA: Diagnosis not present

## 2022-12-18 DIAGNOSIS — R41841 Cognitive communication deficit: Secondary | ICD-10-CM | POA: Diagnosis not present

## 2022-12-18 DIAGNOSIS — F802 Mixed receptive-expressive language disorder: Secondary | ICD-10-CM | POA: Diagnosis not present

## 2022-12-18 DIAGNOSIS — M2120 Flexion deformity, unspecified site: Secondary | ICD-10-CM | POA: Diagnosis not present

## 2022-12-18 DIAGNOSIS — M542 Cervicalgia: Secondary | ICD-10-CM | POA: Diagnosis not present

## 2022-12-18 DIAGNOSIS — F0390 Unspecified dementia without behavioral disturbance: Secondary | ICD-10-CM | POA: Diagnosis not present

## 2022-12-19 DIAGNOSIS — F0390 Unspecified dementia without behavioral disturbance: Secondary | ICD-10-CM | POA: Diagnosis not present

## 2022-12-19 DIAGNOSIS — M542 Cervicalgia: Secondary | ICD-10-CM | POA: Diagnosis not present

## 2022-12-19 DIAGNOSIS — M2120 Flexion deformity, unspecified site: Secondary | ICD-10-CM | POA: Diagnosis not present

## 2022-12-19 DIAGNOSIS — R41841 Cognitive communication deficit: Secondary | ICD-10-CM | POA: Diagnosis not present

## 2022-12-19 DIAGNOSIS — F802 Mixed receptive-expressive language disorder: Secondary | ICD-10-CM | POA: Diagnosis not present

## 2022-12-21 DIAGNOSIS — M542 Cervicalgia: Secondary | ICD-10-CM | POA: Diagnosis not present

## 2022-12-21 DIAGNOSIS — R41841 Cognitive communication deficit: Secondary | ICD-10-CM | POA: Diagnosis not present

## 2022-12-21 DIAGNOSIS — M2120 Flexion deformity, unspecified site: Secondary | ICD-10-CM | POA: Diagnosis not present

## 2022-12-21 DIAGNOSIS — F802 Mixed receptive-expressive language disorder: Secondary | ICD-10-CM | POA: Diagnosis not present

## 2022-12-21 DIAGNOSIS — F0390 Unspecified dementia without behavioral disturbance: Secondary | ICD-10-CM | POA: Diagnosis not present

## 2022-12-27 DIAGNOSIS — M2120 Flexion deformity, unspecified site: Secondary | ICD-10-CM | POA: Diagnosis not present

## 2022-12-27 DIAGNOSIS — M542 Cervicalgia: Secondary | ICD-10-CM | POA: Diagnosis not present

## 2022-12-27 DIAGNOSIS — R41841 Cognitive communication deficit: Secondary | ICD-10-CM | POA: Diagnosis not present

## 2022-12-27 DIAGNOSIS — F0390 Unspecified dementia without behavioral disturbance: Secondary | ICD-10-CM | POA: Diagnosis not present

## 2022-12-27 DIAGNOSIS — F802 Mixed receptive-expressive language disorder: Secondary | ICD-10-CM | POA: Diagnosis not present

## 2022-12-28 DIAGNOSIS — M2120 Flexion deformity, unspecified site: Secondary | ICD-10-CM | POA: Diagnosis not present

## 2022-12-28 DIAGNOSIS — M542 Cervicalgia: Secondary | ICD-10-CM | POA: Diagnosis not present

## 2022-12-28 DIAGNOSIS — F802 Mixed receptive-expressive language disorder: Secondary | ICD-10-CM | POA: Diagnosis not present

## 2022-12-28 DIAGNOSIS — F0390 Unspecified dementia without behavioral disturbance: Secondary | ICD-10-CM | POA: Diagnosis not present

## 2022-12-28 DIAGNOSIS — R41841 Cognitive communication deficit: Secondary | ICD-10-CM | POA: Diagnosis not present

## 2022-12-30 ENCOUNTER — Other Ambulatory Visit: Payer: Self-pay | Admitting: Internal Medicine

## 2022-12-30 MED ORDER — LORAZEPAM 0.5 MG PO TABS: 0.5000 mg | ORAL_TABLET | Freq: Two times a day (BID) | ORAL | 1 refills | Status: DC

## 2023-01-02 DIAGNOSIS — M2120 Flexion deformity, unspecified site: Secondary | ICD-10-CM | POA: Diagnosis not present

## 2023-01-02 DIAGNOSIS — M542 Cervicalgia: Secondary | ICD-10-CM | POA: Diagnosis not present

## 2023-01-04 DIAGNOSIS — M542 Cervicalgia: Secondary | ICD-10-CM | POA: Diagnosis not present

## 2023-01-04 DIAGNOSIS — M2120 Flexion deformity, unspecified site: Secondary | ICD-10-CM | POA: Diagnosis not present

## 2023-01-09 DIAGNOSIS — F03C11 Unspecified dementia, severe, with agitation: Secondary | ICD-10-CM | POA: Diagnosis not present

## 2023-01-09 DIAGNOSIS — M542 Cervicalgia: Secondary | ICD-10-CM | POA: Diagnosis not present

## 2023-01-09 DIAGNOSIS — F5105 Insomnia due to other mental disorder: Secondary | ICD-10-CM | POA: Diagnosis not present

## 2023-01-09 DIAGNOSIS — M2120 Flexion deformity, unspecified site: Secondary | ICD-10-CM | POA: Diagnosis not present

## 2023-01-09 DIAGNOSIS — F411 Generalized anxiety disorder: Secondary | ICD-10-CM | POA: Diagnosis not present

## 2023-01-11 DIAGNOSIS — M542 Cervicalgia: Secondary | ICD-10-CM | POA: Diagnosis not present

## 2023-01-11 DIAGNOSIS — M2120 Flexion deformity, unspecified site: Secondary | ICD-10-CM | POA: Diagnosis not present

## 2023-01-16 DIAGNOSIS — M542 Cervicalgia: Secondary | ICD-10-CM | POA: Diagnosis not present

## 2023-01-16 DIAGNOSIS — M2120 Flexion deformity, unspecified site: Secondary | ICD-10-CM | POA: Diagnosis not present

## 2023-01-18 DIAGNOSIS — M542 Cervicalgia: Secondary | ICD-10-CM | POA: Diagnosis not present

## 2023-01-18 DIAGNOSIS — M2120 Flexion deformity, unspecified site: Secondary | ICD-10-CM | POA: Diagnosis not present

## 2023-01-23 DIAGNOSIS — M2120 Flexion deformity, unspecified site: Secondary | ICD-10-CM | POA: Diagnosis not present

## 2023-01-23 DIAGNOSIS — M542 Cervicalgia: Secondary | ICD-10-CM | POA: Diagnosis not present

## 2023-01-23 DIAGNOSIS — Z23 Encounter for immunization: Secondary | ICD-10-CM | POA: Diagnosis not present

## 2023-01-25 DIAGNOSIS — M542 Cervicalgia: Secondary | ICD-10-CM | POA: Diagnosis not present

## 2023-01-25 DIAGNOSIS — M2120 Flexion deformity, unspecified site: Secondary | ICD-10-CM | POA: Diagnosis not present

## 2023-01-28 ENCOUNTER — Other Ambulatory Visit: Payer: Self-pay | Admitting: Internal Medicine

## 2023-01-28 MED ORDER — LORAZEPAM 0.5 MG PO TABS: 0.5000 mg | ORAL_TABLET | Freq: Two times a day (BID) | ORAL | 2 refills | Status: AC

## 2023-01-30 DIAGNOSIS — M2120 Flexion deformity, unspecified site: Secondary | ICD-10-CM | POA: Diagnosis not present

## 2023-01-30 DIAGNOSIS — M542 Cervicalgia: Secondary | ICD-10-CM | POA: Diagnosis not present

## 2023-02-01 DIAGNOSIS — M542 Cervicalgia: Secondary | ICD-10-CM | POA: Diagnosis not present

## 2023-02-01 DIAGNOSIS — M2120 Flexion deformity, unspecified site: Secondary | ICD-10-CM | POA: Diagnosis not present

## 2023-02-08 DIAGNOSIS — M542 Cervicalgia: Secondary | ICD-10-CM | POA: Diagnosis not present

## 2023-02-08 DIAGNOSIS — M2120 Flexion deformity, unspecified site: Secondary | ICD-10-CM | POA: Diagnosis not present

## 2023-02-12 DIAGNOSIS — M2120 Flexion deformity, unspecified site: Secondary | ICD-10-CM | POA: Diagnosis not present

## 2023-02-12 DIAGNOSIS — F5105 Insomnia due to other mental disorder: Secondary | ICD-10-CM | POA: Diagnosis not present

## 2023-02-12 DIAGNOSIS — M542 Cervicalgia: Secondary | ICD-10-CM | POA: Diagnosis not present

## 2023-02-12 DIAGNOSIS — G309 Alzheimer's disease, unspecified: Secondary | ICD-10-CM | POA: Diagnosis not present

## 2023-02-12 DIAGNOSIS — F411 Generalized anxiety disorder: Secondary | ICD-10-CM | POA: Diagnosis not present

## 2023-02-12 DIAGNOSIS — F02C18 Dementia in other diseases classified elsewhere, severe, with other behavioral disturbance: Secondary | ICD-10-CM | POA: Diagnosis not present

## 2023-02-15 DIAGNOSIS — M2120 Flexion deformity, unspecified site: Secondary | ICD-10-CM | POA: Diagnosis not present

## 2023-02-15 DIAGNOSIS — M542 Cervicalgia: Secondary | ICD-10-CM | POA: Diagnosis not present

## 2023-03-14 DIAGNOSIS — F411 Generalized anxiety disorder: Secondary | ICD-10-CM | POA: Diagnosis not present

## 2023-03-14 DIAGNOSIS — G4700 Insomnia, unspecified: Secondary | ICD-10-CM | POA: Diagnosis not present

## 2023-03-14 DIAGNOSIS — F03918 Unspecified dementia, unspecified severity, with other behavioral disturbance: Secondary | ICD-10-CM | POA: Diagnosis not present

## 2023-04-09 DIAGNOSIS — G309 Alzheimer's disease, unspecified: Secondary | ICD-10-CM | POA: Diagnosis not present

## 2023-04-09 DIAGNOSIS — F419 Anxiety disorder, unspecified: Secondary | ICD-10-CM | POA: Diagnosis not present

## 2023-04-09 DIAGNOSIS — G4700 Insomnia, unspecified: Secondary | ICD-10-CM | POA: Diagnosis not present

## 2023-04-09 DIAGNOSIS — F02C18 Dementia in other diseases classified elsewhere, severe, with other behavioral disturbance: Secondary | ICD-10-CM | POA: Diagnosis not present

## 2023-05-07 DIAGNOSIS — F419 Anxiety disorder, unspecified: Secondary | ICD-10-CM | POA: Diagnosis not present

## 2023-05-07 DIAGNOSIS — F02C18 Dementia in other diseases classified elsewhere, severe, with other behavioral disturbance: Secondary | ICD-10-CM | POA: Diagnosis not present

## 2023-05-07 DIAGNOSIS — G309 Alzheimer's disease, unspecified: Secondary | ICD-10-CM | POA: Diagnosis not present

## 2023-05-07 DIAGNOSIS — G4700 Insomnia, unspecified: Secondary | ICD-10-CM | POA: Diagnosis not present

## 2023-05-21 DIAGNOSIS — I10 Essential (primary) hypertension: Secondary | ICD-10-CM | POA: Diagnosis not present

## 2023-06-01 DIAGNOSIS — G309 Alzheimer's disease, unspecified: Secondary | ICD-10-CM | POA: Diagnosis not present

## 2023-06-01 DIAGNOSIS — F411 Generalized anxiety disorder: Secondary | ICD-10-CM | POA: Diagnosis not present

## 2023-06-04 DIAGNOSIS — G4700 Insomnia, unspecified: Secondary | ICD-10-CM | POA: Diagnosis not present

## 2023-06-04 DIAGNOSIS — F02C18 Dementia in other diseases classified elsewhere, severe, with other behavioral disturbance: Secondary | ICD-10-CM | POA: Diagnosis not present

## 2023-06-04 DIAGNOSIS — F419 Anxiety disorder, unspecified: Secondary | ICD-10-CM | POA: Diagnosis not present

## 2023-06-04 DIAGNOSIS — G309 Alzheimer's disease, unspecified: Secondary | ICD-10-CM | POA: Diagnosis not present

## 2023-07-02 DIAGNOSIS — F02C18 Dementia in other diseases classified elsewhere, severe, with other behavioral disturbance: Secondary | ICD-10-CM | POA: Diagnosis not present

## 2023-07-02 DIAGNOSIS — F419 Anxiety disorder, unspecified: Secondary | ICD-10-CM | POA: Diagnosis not present

## 2023-07-02 DIAGNOSIS — G4700 Insomnia, unspecified: Secondary | ICD-10-CM | POA: Diagnosis not present

## 2023-07-02 DIAGNOSIS — G309 Alzheimer's disease, unspecified: Secondary | ICD-10-CM | POA: Diagnosis not present

## 2023-07-06 DIAGNOSIS — E785 Hyperlipidemia, unspecified: Secondary | ICD-10-CM | POA: Diagnosis not present

## 2023-07-06 DIAGNOSIS — Z7901 Long term (current) use of anticoagulants: Secondary | ICD-10-CM | POA: Diagnosis not present

## 2023-07-06 DIAGNOSIS — F03918 Unspecified dementia, unspecified severity, with other behavioral disturbance: Secondary | ICD-10-CM | POA: Diagnosis not present

## 2023-07-06 DIAGNOSIS — F419 Anxiety disorder, unspecified: Secondary | ICD-10-CM | POA: Diagnosis not present

## 2023-07-06 DIAGNOSIS — F5101 Primary insomnia: Secondary | ICD-10-CM | POA: Diagnosis not present

## 2023-07-16 DIAGNOSIS — H2513 Age-related nuclear cataract, bilateral: Secondary | ICD-10-CM | POA: Diagnosis not present

## 2023-07-18 DIAGNOSIS — E785 Hyperlipidemia, unspecified: Secondary | ICD-10-CM | POA: Diagnosis not present

## 2023-07-27 DIAGNOSIS — G309 Alzheimer's disease, unspecified: Secondary | ICD-10-CM | POA: Diagnosis not present

## 2023-07-27 DIAGNOSIS — F411 Generalized anxiety disorder: Secondary | ICD-10-CM | POA: Diagnosis not present

## 2023-07-29 ENCOUNTER — Emergency Department (HOSPITAL_COMMUNITY)

## 2023-07-29 ENCOUNTER — Encounter (HOSPITAL_COMMUNITY): Payer: Self-pay | Admitting: Emergency Medicine

## 2023-07-29 ENCOUNTER — Other Ambulatory Visit: Payer: Self-pay

## 2023-07-29 ENCOUNTER — Emergency Department (HOSPITAL_COMMUNITY)
Admission: EM | Admit: 2023-07-29 | Discharge: 2023-07-29 | Disposition: A | Discharge status: Skilled Nursing Facility | Attending: Emergency Medicine | Admitting: Emergency Medicine

## 2023-07-29 DIAGNOSIS — F0390 Unspecified dementia without behavioral disturbance: Secondary | ICD-10-CM | POA: Insufficient documentation

## 2023-07-29 DIAGNOSIS — R059 Cough, unspecified: Secondary | ICD-10-CM | POA: Diagnosis not present

## 2023-07-29 DIAGNOSIS — Z7901 Long term (current) use of anticoagulants: Secondary | ICD-10-CM | POA: Insufficient documentation

## 2023-07-29 DIAGNOSIS — I959 Hypotension, unspecified: Secondary | ICD-10-CM | POA: Diagnosis not present

## 2023-07-29 DIAGNOSIS — U071 COVID-19: Secondary | ICD-10-CM | POA: Insufficient documentation

## 2023-07-29 DIAGNOSIS — I700 Atherosclerosis of aorta: Secondary | ICD-10-CM | POA: Diagnosis not present

## 2023-07-29 DIAGNOSIS — R61 Generalized hyperhidrosis: Secondary | ICD-10-CM | POA: Diagnosis not present

## 2023-07-29 DIAGNOSIS — R531 Weakness: Secondary | ICD-10-CM | POA: Diagnosis present

## 2023-07-29 LAB — URINALYSIS, W/ REFLEX TO CULTURE (INFECTION SUSPECTED)
Bacteria, UA: NONE SEEN
Bilirubin Urine: NEGATIVE
Glucose, UA: NEGATIVE mg/dL
Hgb urine dipstick: NEGATIVE
Ketones, ur: 5 mg/dL — AB
Leukocytes,Ua: NEGATIVE
Nitrite: NEGATIVE
Protein, ur: 30 mg/dL — AB
Specific Gravity, Urine: 1.024 (ref 1.005–1.030)
pH: 5 (ref 5.0–8.0)

## 2023-07-29 LAB — CBC WITH DIFFERENTIAL/PLATELET
Abs Immature Granulocytes: 0.01 10*3/uL (ref 0.00–0.07)
Basophils Absolute: 0 10*3/uL (ref 0.0–0.1)
Basophils Relative: 0 %
Eosinophils Absolute: 0 10*3/uL (ref 0.0–0.5)
Eosinophils Relative: 0 %
HCT: 40.3 % (ref 39.0–52.0)
Hemoglobin: 13.4 g/dL (ref 13.0–17.0)
Immature Granulocytes: 0 %
Lymphocytes Relative: 9 %
Lymphs Abs: 0.6 10*3/uL — ABNORMAL LOW (ref 0.7–4.0)
MCH: 33.6 pg (ref 26.0–34.0)
MCHC: 33.3 g/dL (ref 30.0–36.0)
MCV: 101 fL — ABNORMAL HIGH (ref 80.0–100.0)
Monocytes Absolute: 0.4 10*3/uL (ref 0.1–1.0)
Monocytes Relative: 6 %
Neutro Abs: 5.2 10*3/uL (ref 1.7–7.7)
Neutrophils Relative %: 85 %
Platelets: 110 10*3/uL — ABNORMAL LOW (ref 150–400)
RBC: 3.99 MIL/uL — ABNORMAL LOW (ref 4.22–5.81)
RDW: 14 % (ref 11.5–15.5)
WBC: 6.1 10*3/uL (ref 4.0–10.5)
nRBC: 0 % (ref 0.0–0.2)

## 2023-07-29 LAB — COMPREHENSIVE METABOLIC PANEL WITH GFR
ALT: 50 U/L — ABNORMAL HIGH (ref 0–44)
AST: 72 U/L — ABNORMAL HIGH (ref 15–41)
Albumin: 3.7 g/dL (ref 3.5–5.0)
Alkaline Phosphatase: 68 U/L (ref 38–126)
Anion gap: 9 (ref 5–15)
BUN: 15 mg/dL (ref 8–23)
CO2: 26 mmol/L (ref 22–32)
Calcium: 8.5 mg/dL — ABNORMAL LOW (ref 8.9–10.3)
Chloride: 106 mmol/L (ref 98–111)
Creatinine, Ser: 1.01 mg/dL (ref 0.61–1.24)
GFR, Estimated: >60 mL/min (ref >60)
Glucose, Bld: 104 mg/dL — ABNORMAL HIGH (ref 70–99)
Potassium: 3.7 mmol/L (ref 3.5–5.1)
Sodium: 141 mmol/L (ref 135–145)
Total Bilirubin: 0.9 mg/dL (ref 0.0–1.2)
Total Protein: 6.9 g/dL (ref 6.5–8.1)

## 2023-07-29 LAB — PROTIME-INR
INR: 2.2 — ABNORMAL HIGH (ref 0.8–1.2)
Prothrombin Time: 24.5 s — ABNORMAL HIGH (ref 11.4–15.2)

## 2023-07-29 LAB — I-STAT CG4 LACTIC ACID, ED: Lactic Acid, Venous: 1.1 mmol/L (ref 0.5–1.9)

## 2023-07-29 LAB — RESP PANEL BY RT-PCR (RSV, FLU A&B, COVID)  RVPGX2
Influenza A by PCR: NEGATIVE
Influenza B by PCR: NEGATIVE
Resp Syncytial Virus by PCR: NEGATIVE
SARS Coronavirus 2 by RT PCR: POSITIVE — AB

## 2023-07-29 LAB — APTT: aPTT: 34 s (ref 24–36)

## 2023-07-29 MED ORDER — ACETAMINOPHEN 650 MG RE SUPP
650.0000 mg | RECTAL | Status: DC | PRN
  Administered 2023-07-29: 650 mg via RECTAL
  Filled 2023-07-29: qty 1

## 2023-07-29 MED ORDER — LACTATED RINGERS IV SOLN: INTRAVENOUS | Status: DC

## 2023-07-29 NOTE — ED Triage Notes (Signed)
 Pt bib EMS from [facility] due to weakness and decreased alertness. Pt diagnosed with covid testing returned today. Pt in memory care.

## 2023-07-29 NOTE — ED Provider Notes (Addendum)
 [place] EMERGENCY DEPARTMENT AT [facility] Provider Note   CSN: [id number] Arrival date & time: 07/29/23  1031     History  Chief Complaint  Patient presents with   Weakness   Fatigue    James Noble is a 83 y.o. male.  83 year old male presents from nursing home due to altered mental status.  Patient has a baseline history of dementia and is at memory care unit.  Patient was diagnosed with COVID today.  Was found to be febrile.  Due to his change in mental status was sent for further evaluation.  Information is per EMS who is at bedside       Home Medications Prior to Admission medications   Medication Sig Start Date End Date Taking? Authorizing Provider  acetaminophen (TYLENOL) 500 MG tablet Take 500 mg by mouth in the morning and at bedtime.    [provider]  donepezil (ARICEPT) 5 MG tablet Take 5 mg by mouth at bedtime. 12/21/21   [provider]  famotidine (PEPCID) 40 MG tablet Take 1 tablet by mouth at bedtime. 02/08/21   [provider]  fluticasone (FLONASE) 50 MCG/ACT nasal spray Place 2 sprays into both nostrils at bedtime.    [provider]  guaiFENesin (MUCINEX) 600 MG 12 hr tablet Take 600 mg by mouth 2 (two) times daily.    [provider]  LORazepam (ATIVAN) 0.5 MG tablet Take 1 tablet (0.5 mg total) by mouth 2 (two) times daily. 01/28/23   Eloisa Northern, MD  losartan (COZAAR) 50 MG tablet Take 50 mg by mouth daily.    [provider]  nortriptyline (PAMELOR) 25 MG capsule Take 25 mg by mouth at bedtime. 12/26/21   [provider]  rivaroxaban (XARELTO) 10 MG TABS tablet Take 1 tablet (10 mg total) by mouth daily. 03/29/22   Alver Sorrow, NP  rosuvastatin (CRESTOR) 20 MG tablet Take 20 mg by mouth daily. 10/31/21   [provider]      Allergies    Dexlansoprazole and Nexium [esomeprazole magnesium]    Review of Systems   Review of Systems  Unable to perform ROS: Mental  status change    Physical Exam Updated Vital Signs BP 119/81 (BP Location: Left Arm)   Pulse 90   Temp (!) 102.4 F (39.1 C) (Oral)   Resp 16   Ht 1.803 m (5\' 11" )   Wt 90.7 kg   SpO2 96%   BMI 27.89 kg/m  Physical Exam Vitals and nursing note reviewed.  Constitutional:      General: He is not in acute distress.    Appearance: Normal appearance. He is well-developed. He is not toxic-appearing.  HENT:     Head: Normocephalic and atraumatic.  Eyes:     General: Lids are normal.     Conjunctiva/sclera: Conjunctivae normal.     Pupils: Pupils are equal, round, and reactive to light.  Neck:     Thyroid: No thyroid mass.     Trachea: No tracheal deviation.  Cardiovascular:     Rate and Rhythm: Normal rate and regular rhythm.     Heart sounds: Normal heart sounds. No murmur heard.    No gallop.  Pulmonary:     Effort: Pulmonary effort is normal. No respiratory distress.     Breath sounds: Normal breath sounds. No stridor. No decreased breath sounds, wheezing, rhonchi or rales.  Abdominal:     General: There is no distension.     Palpations:  Abdomen is soft.     Tenderness: There is no abdominal tenderness. There is no rebound.  Musculoskeletal:        General: No tenderness. Normal range of motion.     Cervical back: Normal range of motion and neck supple.  Skin:    General: Skin is warm and dry.     Findings: No abrasion or rash.  Neurological:     Mental Status: He is lethargic and disoriented.     GCS: GCS eye subscore is 4. GCS verbal subscore is 5. GCS motor subscore is 6.     Cranial Nerves: No cranial nerve deficit.     Sensory: No sensory deficit.     Motor: Motor function is intact.     Comments: All 4 extremities at this time.  Psychiatric:        Attention and Perception: He is inattentive.     ED Results / Procedures / Treatments   Labs (all labs ordered are listed, but only abnormal results are displayed) Labs Reviewed  CULTURE, BLOOD (ROUTINE X 2)   CULTURE, BLOOD (ROUTINE X 2)  RESP PANEL BY RT-PCR (RSV, FLU A&B, COVID)  RVPGX2  COMPREHENSIVE METABOLIC PANEL WITH GFR  CBC WITH DIFFERENTIAL/PLATELET  PROTIME-INR  APTT  URINALYSIS, W/ REFLEX TO CULTURE (INFECTION SUSPECTED)  I-STAT CG4 LACTIC ACID, ED    EKG EKG Interpretation Date/Time:  Sunday July 29 2023 10:51:45 EDT Ventricular Rate:  86 PR Interval:  174 QRS Duration:  133 QT Interval:  364 QTC Calculation: 436 R Axis:   94  Text Interpretation: Unknown rhythm, irregular rate RBBB and LPFB No significant change since last tracing Confirmed by Lorre Nick ([id number]) on 07/29/2023 10:53:29 AM  Radiology No results found.  Procedures Procedures    Medications Ordered in ED Medications  acetaminophen (TYLENOL) suppository 650 mg (has no administration in time range)  lactated ringers infusion (has no administration in time range)    ED Course/ Medical Decision Making/ A&P                                 Medical Decision Making Amount and/or Complexity of Data Reviewed Labs: ordered. Radiology: ordered.  Risk OTC drugs. Prescription drug management.   Patient treated with Tylenol for his fever here.  Chest x-ray showed no acute infiltrates.  Urinalysis negative for infection.  He has no leukocytosis on the CBC.  Low suspicion for sepsis.  He is COVID-positive here.  EKG shows sinus rhythm with chronic heart blocks noted.  Suspect patient's current symptoms likely just from COVID.  Will send patient back to his facility Discussed case with patient's son who is at bedside states patient is about his baseline       Final Clinical Impression(s) / ED Diagnoses Final diagnoses:  None    Rx / DC Orders ED Discharge Orders     None         Lorre Nick, MD 07/29/23 1319    Lorre Nick, MD 07/29/23 1320

## 2023-07-29 NOTE — ED Notes (Addendum)
Family at bedside. Patient's son.

## 2023-07-29 NOTE — ED Notes (Signed)
 [facility] contacted and transport arranged back to facility

## 2023-07-29 NOTE — ED Notes (Signed)
 Discharge instructions reviewed with patient. Patient questions answered and opportunity for education reviewed. Patient voices understanding of discharge instructions with no further question. Attempted to call report to SNF x3 with no answer.

## 2023-08-03 LAB — CULTURE, BLOOD (ROUTINE X 2): Culture: NO GROWTH

## 2023-08-13 DIAGNOSIS — G309 Alzheimer's disease, unspecified: Secondary | ICD-10-CM | POA: Diagnosis not present

## 2023-08-13 DIAGNOSIS — F5105 Insomnia due to other mental disorder: Secondary | ICD-10-CM | POA: Diagnosis not present

## 2023-08-13 DIAGNOSIS — F411 Generalized anxiety disorder: Secondary | ICD-10-CM | POA: Diagnosis not present

## 2023-08-13 DIAGNOSIS — F02C18 Dementia in other diseases classified elsewhere, severe, with other behavioral disturbance: Secondary | ICD-10-CM | POA: Diagnosis not present

## 2023-09-10 DIAGNOSIS — G309 Alzheimer's disease, unspecified: Secondary | ICD-10-CM | POA: Diagnosis not present

## 2023-09-10 DIAGNOSIS — F411 Generalized anxiety disorder: Secondary | ICD-10-CM | POA: Diagnosis not present

## 2023-09-10 DIAGNOSIS — G4700 Insomnia, unspecified: Secondary | ICD-10-CM | POA: Diagnosis not present

## 2023-09-10 DIAGNOSIS — F02C0 Dementia in other diseases classified elsewhere, severe, without behavioral disturbance, psychotic disturbance, mood disturbance, and anxiety: Secondary | ICD-10-CM | POA: Diagnosis not present

## 2023-09-14 DIAGNOSIS — F03918 Unspecified dementia, unspecified severity, with other behavioral disturbance: Secondary | ICD-10-CM | POA: Diagnosis not present

## 2023-09-14 DIAGNOSIS — K219 Gastro-esophageal reflux disease without esophagitis: Secondary | ICD-10-CM | POA: Diagnosis not present

## 2023-09-14 DIAGNOSIS — E785 Hyperlipidemia, unspecified: Secondary | ICD-10-CM | POA: Diagnosis not present

## 2023-10-06 DIAGNOSIS — F411 Generalized anxiety disorder: Secondary | ICD-10-CM | POA: Diagnosis not present

## 2023-10-06 DIAGNOSIS — G309 Alzheimer's disease, unspecified: Secondary | ICD-10-CM | POA: Diagnosis not present

## 2023-10-08 DIAGNOSIS — G309 Alzheimer's disease, unspecified: Secondary | ICD-10-CM | POA: Diagnosis not present

## 2023-10-08 DIAGNOSIS — F02C0 Dementia in other diseases classified elsewhere, severe, without behavioral disturbance, psychotic disturbance, mood disturbance, and anxiety: Secondary | ICD-10-CM | POA: Diagnosis not present

## 2023-10-08 DIAGNOSIS — F411 Generalized anxiety disorder: Secondary | ICD-10-CM | POA: Diagnosis not present

## 2023-10-08 DIAGNOSIS — G4700 Insomnia, unspecified: Secondary | ICD-10-CM | POA: Diagnosis not present

## 2023-10-11 DIAGNOSIS — L603 Nail dystrophy: Secondary | ICD-10-CM | POA: Diagnosis not present

## 2023-10-11 DIAGNOSIS — I739 Peripheral vascular disease, unspecified: Secondary | ICD-10-CM | POA: Diagnosis not present

## 2023-10-11 DIAGNOSIS — L602 Onychogryphosis: Secondary | ICD-10-CM | POA: Diagnosis not present

## 2023-10-11 DIAGNOSIS — L84 Corns and callosities: Secondary | ICD-10-CM | POA: Diagnosis not present

## 2023-10-24 DIAGNOSIS — F0390 Unspecified dementia without behavioral disturbance: Secondary | ICD-10-CM | POA: Diagnosis not present

## 2023-10-24 DIAGNOSIS — R278 Other lack of coordination: Secondary | ICD-10-CM | POA: Diagnosis not present

## 2023-10-24 DIAGNOSIS — M6281 Muscle weakness (generalized): Secondary | ICD-10-CM | POA: Diagnosis not present

## 2023-11-08 ENCOUNTER — Other Ambulatory Visit: Payer: Self-pay

## 2023-11-08 ENCOUNTER — Emergency Department (HOSPITAL_COMMUNITY)

## 2023-11-08 ENCOUNTER — Emergency Department (HOSPITAL_COMMUNITY)
Admission: EM | Admit: 2023-11-08 | Discharge: 2023-11-08 | Disposition: A | Discharge status: Home / Self Care | Source: Skilled Nursing Facility | Attending: Emergency Medicine | Admitting: Emergency Medicine

## 2023-11-08 ENCOUNTER — Encounter (HOSPITAL_COMMUNITY): Payer: Self-pay

## 2023-11-08 DIAGNOSIS — F0390 Unspecified dementia without behavioral disturbance: Secondary | ICD-10-CM | POA: Diagnosis not present

## 2023-11-08 DIAGNOSIS — N281 Cyst of kidney, acquired: Secondary | ICD-10-CM | POA: Diagnosis not present

## 2023-11-08 DIAGNOSIS — K4090 Unilateral inguinal hernia, without obstruction or gangrene, not specified as recurrent: Secondary | ICD-10-CM | POA: Diagnosis present

## 2023-11-08 DIAGNOSIS — I959 Hypotension, unspecified: Secondary | ICD-10-CM | POA: Diagnosis not present

## 2023-11-08 DIAGNOSIS — N400 Enlarged prostate without lower urinary tract symptoms: Secondary | ICD-10-CM | POA: Diagnosis not present

## 2023-11-08 DIAGNOSIS — R109 Unspecified abdominal pain: Secondary | ICD-10-CM | POA: Diagnosis not present

## 2023-11-08 LAB — CBC WITH DIFFERENTIAL/PLATELET
Abs Immature Granulocytes: 0.01 K/uL (ref 0.00–0.07)
Basophils Absolute: 0 K/uL (ref 0.0–0.1)
Basophils Relative: 0 %
Eosinophils Absolute: 0.1 K/uL (ref 0.0–0.5)
Eosinophils Relative: 1 %
HCT: 38.3 % — ABNORMAL LOW (ref 39.0–52.0)
Hemoglobin: 12.5 g/dL — ABNORMAL LOW (ref 13.0–17.0)
Immature Granulocytes: 0 %
Lymphocytes Relative: 22 %
Lymphs Abs: 1.3 K/uL (ref 0.7–4.0)
MCH: 33.4 pg (ref 26.0–34.0)
MCHC: 32.6 g/dL (ref 30.0–36.0)
MCV: 102.4 fL — ABNORMAL HIGH (ref 80.0–100.0)
Monocytes Absolute: 0.4 K/uL (ref 0.1–1.0)
Monocytes Relative: 7 %
Neutro Abs: 4.1 K/uL (ref 1.7–7.7)
Neutrophils Relative %: 70 %
Platelets: 134 K/uL — ABNORMAL LOW (ref 150–400)
RBC: 3.74 MIL/uL — ABNORMAL LOW (ref 4.22–5.81)
RDW: 13.4 % (ref 11.5–15.5)
WBC: 5.9 K/uL (ref 4.0–10.5)
nRBC: 0 % (ref 0.0–0.2)

## 2023-11-08 LAB — BASIC METABOLIC PANEL WITH GFR
Anion gap: 8 (ref 5–15)
BUN: 16 mg/dL (ref 8–23)
CO2: 28 mmol/L (ref 22–32)
Calcium: 8.6 mg/dL — ABNORMAL LOW (ref 8.9–10.3)
Chloride: 104 mmol/L (ref 98–111)
Creatinine, Ser: 0.85 mg/dL (ref 0.61–1.24)
GFR, Estimated: >60 mL/min (ref >60)
Glucose, Bld: 110 mg/dL — ABNORMAL HIGH (ref 70–99)
Potassium: 3.8 mmol/L (ref 3.5–5.1)
Sodium: 140 mmol/L (ref 135–145)

## 2023-11-08 MED ORDER — IOHEXOL 300 MG/ML  SOLN
100.0000 mL | Freq: Once | INTRAMUSCULAR | Status: AC | PRN
  Administered 2023-11-08: 100 mL via INTRAVENOUS

## 2023-11-08 NOTE — ED Triage Notes (Signed)
 EMS reports from [facility], called out for Pt has Dx of inguinal hernia that has been enlarging.  BP 119/56 HR 58 RR 16 Sp02 97 RA

## 2023-11-08 NOTE — ED Provider Notes (Signed)
 [place] EMERGENCY DEPARTMENT AT [facility] Provider Note   CSN: [id number] Arrival date & time: 11/08/23  1736     Patient presents with: Inguinal Hernia   James Noble is a 83 y.o. male.   HPI Patient [facility] from [facility] with concern for possible inguinal hernia.  Patient has dementia.  Level 5 caveat.  Patient does not have pain, nausea, complaints.  Per report, EMS at bedside, staff at his facility noticed enlarging lesion, hernia like in the left suprapubic region.  No report of fall, trauma, change from baseline interactivity.    Prior to Admission medications   Medication Sig Start Date End Date Taking? Authorizing Provider  acetaminophen  (TYLENOL ) 500 MG tablet Take 500 mg by mouth in the morning and at bedtime.    [provider]  donepezil (ARICEPT) 5 MG tablet Take 5 mg by mouth at bedtime. 12/21/21   [provider]  famotidine (PEPCID) 40 MG tablet Take 1 tablet by mouth at bedtime. 02/08/21   [provider]  fluticasone (FLONASE) 50 MCG/ACT nasal spray Place 2 sprays into both nostrils at bedtime.    [provider]  guaiFENesin (MUCINEX) 600 MG 12 hr tablet Take 600 mg by mouth 2 (two) times daily.    [provider]  LORazepam  (ATIVAN ) 0.5 MG tablet Take 1 tablet (0.5 mg total) by mouth 2 (two) times daily. 01/28/23   Amin, Saad, MD  losartan  (COZAAR ) 50 MG tablet Take 50 mg by mouth daily.    [provider]  nortriptyline (PAMELOR) 25 MG capsule Take 25 mg by mouth at bedtime. 12/26/21   [provider]  rivaroxaban  (XARELTO ) 10 MG TABS tablet Take 1 tablet (10 mg total) by mouth daily. 03/29/22   Vannie Reche RAMAN, NP  rosuvastatin  (CRESTOR ) 20 MG tablet Take 20 mg by mouth daily. 10/31/21   [provider]    Allergies: Dexlansoprazole and Nexium [esomeprazole magnesium]    Review of Systems  Updated Vital Signs BP 119/69 (BP Location: Left Arm)   Pulse 68    Temp 97.9 F (36.6 C) (Oral)   Resp 18   SpO2 97%   Physical Exam Vitals and nursing note reviewed. Exam conducted with a chaperone present.  Constitutional:      General: He is not in acute distress.    Appearance: He is well-developed.  HENT:     Head: Normocephalic and atraumatic.  Eyes:     Conjunctiva/sclera: Conjunctivae normal.  Cardiovascular:     Rate and Rhythm: Normal rate and regular rhythm.  Pulmonary:     Effort: Pulmonary effort is normal. No respiratory distress.     Breath sounds: No stridor.  Abdominal:     General: There is no distension.  Genitourinary:   Skin:    General: Skin is warm and dry.  Neurological:     Mental Status: He is alert.  Psychiatric:        Cognition and Memory: Cognition is impaired. Memory is impaired.     (all labs ordered are listed, but only abnormal results are displayed) Labs Reviewed  BASIC METABOLIC PANEL WITH GFR - Abnormal; Notable for the following components:      Result Value   Glucose, Bld 110 (*)    Calcium  8.6 (*)    All other components within normal limits  CBC WITH DIFFERENTIAL/PLATELET - Abnormal; Notable for the following components:   RBC 3.74 (*)    Hemoglobin 12.5 (*)  HCT 38.3 (*)    MCV 102.4 (*)    Platelets 134 (*)    All other components within normal limits    EKG: None  Radiology: CT ABDOMEN PELVIS W CONTRAST Result Date: 11/08/2023 CLINICAL DATA:  Abdominal pain EXAM: CT ABDOMEN AND PELVIS WITH CONTRAST TECHNIQUE: Multidetector CT imaging of the abdomen and pelvis was performed using the standard protocol following bolus administration of intravenous contrast. RADIATION DOSE REDUCTION: This exam was performed according to the departmental dose-optimization program which includes automated exposure control, adjustment of the mA and/or kV according to patient size and/or use of iterative reconstruction technique. CONTRAST:  OMNIPAQUE  IOHEXOL  300 MG/ML  SOLN COMPARISON:  07/08/2019  FINDINGS: Lower chest: Dependent atelectasis in the lower lobes. No effusions. Hepatobiliary: No focal hepatic abnormality. Gallbladder unremarkable. Pancreas: No focal abnormality or ductal dilatation. Spleen: No focal abnormality.  Normal size. Adrenals/Urinary Tract: Adrenal glands normal. 2.1 cm cyst in the lower pole of the left kidney. No follow-up imaging recommended. No stones or hydronephrosis. Urinary bladder unremarkable. Stomach/Bowel: Moderate-sized left inguinal hernia containing sigmoid colon. No bowel obstruction. Moderate stool burden throughout the colon. Stomach and small bowel decompressed. No inflammatory process. Vascular/Lymphatic: Aortic atherosclerosis. No evidence of aneurysm or adenopathy. Reproductive: Mildly enlarged prostate Other: No free fluid or free air. Musculoskeletal: No acute bony abnormality. IMPRESSION: Moderate-sized left inguinal hernia containing sigmoid colon. No bowel obstruction. Moderate stool burden throughout the colon. Aortic atherosclerosis. Electronically Signed   By: Franky Crease M.D.   On: 11/08/2023 19:07     Procedures   Medications Ordered in the ED  iohexol  (OMNIPAQUE ) 300 MG/ML solution 100 mL (100 mLs Intravenous Contrast Given 11/08/23 1841)                                    Medical Decision Making Patient presents with concern from nursing home staff of questionable suprapubic lesion.  Differential includes mass versus hernia.  Patient has no overt distress, has slightly low blood pressure, otherwise is hemodynamically unremarkable. Pulse ox 100% room air normal  Amount and/or Complexity of Data Reviewed Independent Historian: EMS External Data Reviewed: notes. Labs: ordered. Decision-making details documented in ED Course. Radiology: ordered and independent interpretation performed. Decision-making details documented in ED Course.  Risk Prescription drug management. Decision regarding hospitalization. Diagnosis or treatment  significantly limited by social determinants of health.  Update: Patient accompanied by his son.  At bedside we discussed the patient's presentation, history. 8:29 PM CT results available, demonstrated to the son at bedside and discussed with our general surgeon Dr. Vanderbilt.  With no evidence for obstruction, incarceration, no hemodynamic instability, and no current complaint from the patient patient appropriate for close outpatient follow-up which will be facilitated by Dr. Vanderbilt in her general surgery team.      Final diagnoses:  Left inguinal hernia    ED Discharge Orders     None          Garrick Charleston, MD 11/08/23 2029

## 2023-11-08 NOTE — Discharge Instructions (Addendum)
 If our surgery colleagues do not contact you within the next few days for follow-up, please use the information provided above to ensure appropriate ongoing care.

## 2023-11-08 NOTE — ED Notes (Signed)
[facility] called to cancel transport

## 2023-11-08 NOTE — ED Notes (Signed)
 Family willing to take patient back to [facility] Pt has been ambulatory with family member around the unit.

## 2023-11-13 DIAGNOSIS — G309 Alzheimer's disease, unspecified: Secondary | ICD-10-CM | POA: Diagnosis not present

## 2023-11-13 DIAGNOSIS — F411 Generalized anxiety disorder: Secondary | ICD-10-CM | POA: Diagnosis not present

## 2023-11-13 DIAGNOSIS — G4700 Insomnia, unspecified: Secondary | ICD-10-CM | POA: Diagnosis not present

## 2023-11-23 ENCOUNTER — Telehealth: Payer: Self-pay | Admitting: *Deleted

## 2023-11-23 ENCOUNTER — Ambulatory Visit: Payer: Self-pay | Admitting: Surgery

## 2023-11-23 DIAGNOSIS — K4090 Unilateral inguinal hernia, without obstruction or gangrene, not specified as recurrent: Secondary | ICD-10-CM | POA: Diagnosis not present

## 2023-11-23 NOTE — H&P (View-Only) (Signed)
 Subjective    Chief Complaint: New Consultation ( Inguinal hernia )       History of Present Illness: James Noble is a 83 y.o. male who is seen today as an office consultation at the request of Dr. Caleen for evaluation of New Consultation ( Inguinal hernia ) .     This is an 83 year old male who resides at [facility] memory care who presents with a visible palpable bulge in his left groin.  The patient denies any significant discomfort but he does have significant memory issues.  The patient was evaluated recently in the emergency department for the swelling in his groin.  CT scan revealed a large left inguinal hernia containing a segment of sigmoid colon.  The hernia remains reducible.  The patient was referred to us  to discuss hernia repair.   The patient has a history of saddle pulmonary embolism and is on Xarelto .  He has no other apparent cardiac or pulmonary issues.   He is accompanied by his son.     Review of Systems: A complete review of systems was obtained from the patient.  I have reviewed this information and discussed as appropriate with the patient.  See HPI as well for other ROS.   Review of Systems  Constitutional: Negative.   HENT: Negative.    Eyes: Negative.   Respiratory: Negative.    Cardiovascular: Negative.   Gastrointestinal: Negative.   Genitourinary: Negative.   Musculoskeletal: Negative.   Skin: Negative.   Neurological:  Positive for weakness.  Endo/Heme/Allergies: Negative.   Psychiatric/Behavioral:  Positive for memory loss.         Medical History: Past Medical History      Past Medical History:  Diagnosis Date   DVT (deep venous thrombosis) (CMS/HHS-HCC)     Hyperlipidemia     Hypertension          Problem List     Patient Active Problem List  Diagnosis   Arthralgia   Deep vein thrombosis (DVT) of left lower extremity (CMS/HHS-HCC)   Disorder of bilirubin excretion   Dysphonia   GERD (gastroesophageal reflux disease)   HTN  (hypertension)   Hyperglycemia   Hyperlipidemia   Pulmonary emboli (CMS/HHS-HCC)   RBBB   S/P IVC filter        Past Surgical History  History reviewed. No pertinent surgical history.      Allergies       Allergies  Allergen Reactions   Dexlansoprazole Other (See Comments) and Nausea And Vomiting   Proton Pump Inhibitors Other (See Comments) and Nausea And Vomiting      Nausea & vomiting   nexium    nexium        Medications Ordered Prior to Encounter        Current Outpatient Medications on File Prior to Visit  Medication Sig Dispense Refill   acetaminophen  (TYLENOL ) 500 MG tablet Take 500 mg by mouth 2 (two) times daily       donepeziL (ARICEPT) 5 MG tablet Take 5 mg by mouth at bedtime       famotidine (PEPCID) 40 MG tablet Take 40 mg by mouth once daily       guaiFENesin (MUCINEX) 600 mg SR tablet Take 600 mg by mouth 2 (two) times daily       LORazepam  (ATIVAN ) 0.5 MG tablet Take 0.5 mg by mouth 2 (two) times daily       losartan  (COZAAR ) 50 MG tablet Take 50 mg by mouth once daily  nortriptyline (PAMELOR) 25 MG capsule Take 25 mg by mouth at bedtime       rivaroxaban  (XARELTO ) 10 mg tablet Take 10 mg by mouth once daily       rosuvastatin  (CRESTOR ) 20 MG tablet Take 20 mg by mouth once daily        No current facility-administered medications on file prior to visit.        Family History  Family History  Family history unknown: Yes        Tobacco Use History  Social History       Tobacco Use  Smoking Status Never  Smokeless Tobacco Never        Social History  Social History        Socioeconomic History   Marital status: Married  Tobacco Use   Smoking status: Never   Smokeless tobacco: Never  Vaping Use   Vaping status: Never Used  Substance and Sexual Activity   Alcohol use: Never   Drug use: Never   Sexual activity: Defer    Social Drivers of Health        Housing Stability: Unknown (11/23/2023)    Housing Stability Vital  Sign     Homeless in the Last Year: No        Objective:         Vitals:    11/23/23 1110  BP: 130/82  Pulse: 74  Temp: 36.7 C (98 F)  TempSrc: Temporal  SpO2: 99%  Weight: 72.6 kg (160 lb)  Height: 177.8 cm (5' 10)    Body mass index is 22.96 kg/m.   Physical Exam    Constitutional:  WDWN in NAD, conversant, no obvious deformities; lying in bed comfortably Eyes:  Pupils equal, round; sclera anicteric; moist conjunctiva; no lid lag HENT:  Oral mucosa moist; good dentition  Neck:  No masses palpated, trachea midline; no thyromegaly Lungs:  CTA bilaterally; normal respiratory effort CV:  Regular rate and rhythm; no murmurs; extremities well-perfused with no edema Abd:  +bowel sounds, soft, non-tender, no palpable organomegaly; no palpable hernias GU: Bilateral descended testes, no testicular masses, large left inguinal hernia that is reducible.  No sign of right inguinal hernia. Musc: Unsteady gait; no apparent clubbing or cyanosis in extremities Lymphatic:  No palpable cervical or axillary lymphadenopathy Skin:  Warm, dry; no sign of jaundice Psychiatric - alert and oriented x 4; calm mood and affect     Labs, Imaging and Diagnostic Testing: CLINICAL DATA:  Abdominal pain   EXAM: CT ABDOMEN AND PELVIS WITH CONTRAST   TECHNIQUE: Multidetector CT imaging of the abdomen and pelvis was performed using the standard protocol following bolus administration of intravenous contrast.   RADIATION DOSE REDUCTION: This exam was performed according to the departmental dose-optimization program which includes automated exposure control, adjustment of the mA and/or kV according to patient size and/or use of iterative reconstruction technique.   CONTRAST:  OMNIPAQUE  IOHEXOL  300 MG/ML  SOLN   COMPARISON:  07/08/2019   FINDINGS: Lower chest: Dependent atelectasis in the lower lobes. No effusions.   Hepatobiliary: No focal hepatic abnormality.  Gallbladder unremarkable.   Pancreas: No focal abnormality or ductal dilatation.   Spleen: No focal abnormality.  Normal size.   Adrenals/Urinary Tract: Adrenal glands normal. 2.1 cm cyst in the lower pole of the left kidney. No follow-up imaging recommended. No stones or hydronephrosis. Urinary bladder unremarkable.   Stomach/Bowel: Moderate-sized left inguinal hernia containing sigmoid colon. No bowel obstruction. Moderate stool burden throughout the  colon. Stomach and small bowel decompressed. No inflammatory process.   Vascular/Lymphatic: Aortic atherosclerosis. No evidence of aneurysm or adenopathy.   Reproductive: Mildly enlarged prostate   Other: No free fluid or free air.   Musculoskeletal: No acute bony abnormality.   IMPRESSION: Moderate-sized left inguinal hernia containing sigmoid colon. No bowel obstruction.   Moderate stool burden throughout the colon.   Aortic atherosclerosis.     Electronically Signed   By: Franky Crease M.D.   On: 11/08/2023 19:07     Assessment and Plan:  Diagnoses and all orders for this visit:   Non-recurrent unilateral inguinal hernia without obstruction or gangrene     Recommend left inguinal hernia repair with mesh.The surgical procedure has been discussed with the patient.  Potential risks, benefits, alternative treatments, and expected outcomes have been explained.  All of the patient's questions at this time have been answered.  The likelihood of reaching the patient's treatment goal is good.  The patient understands the proposed surgical procedure and wishes to proceed.     We will obtain cardiac clearance from Dr. Jeffrie.  He will need to hold his Xarelto  for 3 days prior to surgery.  Due to his dementia, we want to try to minimize any disruption to his normal surroundings and routine.  Therefore we will plan to perform the surgery as an outpatient.   DONNICE DEWAYNE LIMA, MD  11/23/2023 12:28 PM

## 2023-11-23 NOTE — H&P (Signed)
 Subjective    Chief Complaint: New Consultation ( Inguinal hernia )       History of Present Illness: James Noble is a 83 y.o. male who is seen today as an office consultation at the request of Dr. Caleen for evaluation of New Consultation ( Inguinal hernia ) .     This is an 83 year old male who resides at [facility] memory care who presents with a visible palpable bulge in his left groin.  The patient denies any significant discomfort but he does have significant memory issues.  The patient was evaluated recently in the emergency department for the swelling in his groin.  CT scan revealed a large left inguinal hernia containing a segment of sigmoid colon.  The hernia remains reducible.  The patient was referred to us  to discuss hernia repair.   The patient has a history of saddle pulmonary embolism and is on Xarelto .  He has no other apparent cardiac or pulmonary issues.   He is accompanied by his son.     Review of Systems: A complete review of systems was obtained from the patient.  I have reviewed this information and discussed as appropriate with the patient.  See HPI as well for other ROS.   Review of Systems  Constitutional: Negative.   HENT: Negative.    Eyes: Negative.   Respiratory: Negative.    Cardiovascular: Negative.   Gastrointestinal: Negative.   Genitourinary: Negative.   Musculoskeletal: Negative.   Skin: Negative.   Neurological:  Positive for weakness.  Endo/Heme/Allergies: Negative.   Psychiatric/Behavioral:  Positive for memory loss.         Medical History: Past Medical History      Past Medical History:  Diagnosis Date   DVT (deep venous thrombosis) (CMS/HHS-HCC)     Hyperlipidemia     Hypertension          Problem List     Patient Active Problem List  Diagnosis   Arthralgia   Deep vein thrombosis (DVT) of left lower extremity (CMS/HHS-HCC)   Disorder of bilirubin excretion   Dysphonia   GERD (gastroesophageal reflux disease)   HTN  (hypertension)   Hyperglycemia   Hyperlipidemia   Pulmonary emboli (CMS/HHS-HCC)   RBBB   S/P IVC filter        Past Surgical History  History reviewed. No pertinent surgical history.      Allergies       Allergies  Allergen Reactions   Dexlansoprazole Other (See Comments) and Nausea And Vomiting   Proton Pump Inhibitors Other (See Comments) and Nausea And Vomiting      Nausea & vomiting   nexium    nexium        Medications Ordered Prior to Encounter        Current Outpatient Medications on File Prior to Visit  Medication Sig Dispense Refill   acetaminophen  (TYLENOL ) 500 MG tablet Take 500 mg by mouth 2 (two) times daily       donepeziL (ARICEPT) 5 MG tablet Take 5 mg by mouth at bedtime       famotidine (PEPCID) 40 MG tablet Take 40 mg by mouth once daily       guaiFENesin (MUCINEX) 600 mg SR tablet Take 600 mg by mouth 2 (two) times daily       LORazepam  (ATIVAN ) 0.5 MG tablet Take 0.5 mg by mouth 2 (two) times daily       losartan  (COZAAR ) 50 MG tablet Take 50 mg by mouth once daily  nortriptyline (PAMELOR) 25 MG capsule Take 25 mg by mouth at bedtime       rivaroxaban  (XARELTO ) 10 mg tablet Take 10 mg by mouth once daily       rosuvastatin  (CRESTOR ) 20 MG tablet Take 20 mg by mouth once daily        No current facility-administered medications on file prior to visit.        Family History  Family History  Family history unknown: Yes        Tobacco Use History  Social History       Tobacco Use  Smoking Status Never  Smokeless Tobacco Never        Social History  Social History        Socioeconomic History   Marital status: Married  Tobacco Use   Smoking status: Never   Smokeless tobacco: Never  Vaping Use   Vaping status: Never Used  Substance and Sexual Activity   Alcohol use: Never   Drug use: Never   Sexual activity: Defer    Social Drivers of Health        Housing Stability: Unknown (11/23/2023)    Housing Stability Vital  Sign     Homeless in the Last Year: No        Objective:         Vitals:    11/23/23 1110  BP: 130/82  Pulse: 74  Temp: 36.7 C (98 F)  TempSrc: Temporal  SpO2: 99%  Weight: 72.6 kg (160 lb)  Height: 177.8 cm (5' 10)    Body mass index is 22.96 kg/m.   Physical Exam    Constitutional:  WDWN in NAD, conversant, no obvious deformities; lying in bed comfortably Eyes:  Pupils equal, round; sclera anicteric; moist conjunctiva; no lid lag HENT:  Oral mucosa moist; good dentition  Neck:  No masses palpated, trachea midline; no thyromegaly Lungs:  CTA bilaterally; normal respiratory effort CV:  Regular rate and rhythm; no murmurs; extremities well-perfused with no edema Abd:  +bowel sounds, soft, non-tender, no palpable organomegaly; no palpable hernias GU: Bilateral descended testes, no testicular masses, large left inguinal hernia that is reducible.  No sign of right inguinal hernia. Musc: Unsteady gait; no apparent clubbing or cyanosis in extremities Lymphatic:  No palpable cervical or axillary lymphadenopathy Skin:  Warm, dry; no sign of jaundice Psychiatric - alert and oriented x 4; calm mood and affect     Labs, Imaging and Diagnostic Testing: CLINICAL DATA:  Abdominal pain   EXAM: CT ABDOMEN AND PELVIS WITH CONTRAST   TECHNIQUE: Multidetector CT imaging of the abdomen and pelvis was performed using the standard protocol following bolus administration of intravenous contrast.   RADIATION DOSE REDUCTION: This exam was performed according to the departmental dose-optimization program which includes automated exposure control, adjustment of the mA and/or kV according to patient size and/or use of iterative reconstruction technique.   CONTRAST:  OMNIPAQUE  IOHEXOL  300 MG/ML  SOLN   COMPARISON:  07/08/2019   FINDINGS: Lower chest: Dependent atelectasis in the lower lobes. No effusions.   Hepatobiliary: No focal hepatic abnormality.  Gallbladder unremarkable.   Pancreas: No focal abnormality or ductal dilatation.   Spleen: No focal abnormality.  Normal size.   Adrenals/Urinary Tract: Adrenal glands normal. 2.1 cm cyst in the lower pole of the left kidney. No follow-up imaging recommended. No stones or hydronephrosis. Urinary bladder unremarkable.   Stomach/Bowel: Moderate-sized left inguinal hernia containing sigmoid colon. No bowel obstruction. Moderate stool burden throughout the  colon. Stomach and small bowel decompressed. No inflammatory process.   Vascular/Lymphatic: Aortic atherosclerosis. No evidence of aneurysm or adenopathy.   Reproductive: Mildly enlarged prostate   Other: No free fluid or free air.   Musculoskeletal: No acute bony abnormality.   IMPRESSION: Moderate-sized left inguinal hernia containing sigmoid colon. No bowel obstruction.   Moderate stool burden throughout the colon.   Aortic atherosclerosis.     Electronically Signed   By: Franky Crease M.D.   On: 11/08/2023 19:07     Assessment and Plan:  Diagnoses and all orders for this visit:   Non-recurrent unilateral inguinal hernia without obstruction or gangrene     Recommend left inguinal hernia repair with mesh.The surgical procedure has been discussed with the patient.  Potential risks, benefits, alternative treatments, and expected outcomes have been explained.  All of the patient's questions at this time have been answered.  The likelihood of reaching the patient's treatment goal is good.  The patient understands the proposed surgical procedure and wishes to proceed.     We will obtain cardiac clearance from Dr. Jeffrie.  He will need to hold his Xarelto  for 3 days prior to surgery.  Due to his dementia, we want to try to minimize any disruption to his normal surroundings and routine.  Therefore we will plan to perform the surgery as an outpatient.   DONNICE DEWAYNE LIMA, MD  11/23/2023 12:28 PM

## 2023-11-23 NOTE — Telephone Encounter (Signed)
   Pre-operative Risk Assessment    Patient Name: James Noble  DOB: 05-30-1940 MRN: [id number]   Date of last office visit: 12/30/21 DR. SKAINS Date of next office visit: NONE   Request for Surgical Clearance    Procedure:  HERNIA REPAIR  Date of Surgery:  Clearance TBD                                Surgeon: DR. MATTHEW TSUEI Surgeon's Group or Practice Name:  [facility] Phone number:  [phone] Fax number:  [phone] BURNARD CRETE, LPN   Type of Clearance Requested:   - Medical  - Pharmacy:  Hold Rivaroxaban  (Xarelto )     Type of Anesthesia:  General    Additional requests/questions:    Bonney Niels Jest   11/23/2023, 5:16 PM

## 2023-11-27 NOTE — Telephone Encounter (Signed)
 Preop clearance appt now scheduled

## 2023-11-27 NOTE — Telephone Encounter (Signed)
   Name: James Noble  DOB: 03/02/1941  MRN: [id number]  Primary Cardiologist: Oneil Parchment, MD  Chart reviewed as part of pre-operative protocol coverage. Because of James Noble past medical history and time since last visit, he will require a follow-up in-office visit in order to better assess preoperative cardiovascular risk.  Pre-op covering staff: - Please schedule appointment and call patient to inform them. If patient already had an upcoming appointment within acceptable timeframe, please add pre-op clearance to the appointment notes so provider is aware. - Please contact requesting surgeon's office via preferred method (i.e, phone, fax) to inform them of need for appointment prior to surgery.  This message will also be routed to pharmacy pool for input on holding Xarelto  as requested below so that this information is available to the clearing provider at time of patient's appointment.   Barnie Hila, NP  11/27/2023, 4:08 PM

## 2023-11-27 NOTE — Telephone Encounter (Signed)
Please advise holding Xarelto prior to hernia repair not yet scheduled.   Thank you!  DW

## 2023-11-28 DIAGNOSIS — F339 Major depressive disorder, recurrent, unspecified: Secondary | ICD-10-CM | POA: Diagnosis not present

## 2023-11-28 DIAGNOSIS — E785 Hyperlipidemia, unspecified: Secondary | ICD-10-CM | POA: Diagnosis not present

## 2023-11-28 DIAGNOSIS — K219 Gastro-esophageal reflux disease without esophagitis: Secondary | ICD-10-CM | POA: Diagnosis not present

## 2023-11-28 NOTE — Telephone Encounter (Signed)
 Patient with diagnosis of PE/DVT on Xarelto  for anticoagulation.    Procedure: HERNIA REPAIR  Date of procedure: TBD  CrCl 87 ml/min Platelet count 134  Patient has completed 1 year of full dose anticoagulation and is now on prophylaxis dosing.  Per office protocol, patient can hold Xarelto  for 2 days prior to procedure.    **This guidance is not considered finalized until pre-operative APP has relayed final recommendations.**

## 2023-12-03 DIAGNOSIS — G4700 Insomnia, unspecified: Secondary | ICD-10-CM | POA: Diagnosis not present

## 2023-12-03 DIAGNOSIS — F02C0 Dementia in other diseases classified elsewhere, severe, without behavioral disturbance, psychotic disturbance, mood disturbance, and anxiety: Secondary | ICD-10-CM | POA: Diagnosis not present

## 2023-12-03 DIAGNOSIS — F411 Generalized anxiety disorder: Secondary | ICD-10-CM | POA: Diagnosis not present

## 2023-12-03 DIAGNOSIS — G309 Alzheimer's disease, unspecified: Secondary | ICD-10-CM | POA: Diagnosis not present

## 2023-12-04 NOTE — Telephone Encounter (Signed)
 Pt is scheduled for IN OFFICE Preop appt 12/10/23 with Rosaline Bane, NP  Will update surgeons office.

## 2023-12-09 NOTE — Progress Notes (Signed)
 Cardiology Office Note   Date:  12/10/2023  ID:  James Noble, DOB [birth year], MRN [id number] PCP: Caleen Dirks, MD  [facility] Providers Cardiologist:  Oneil Parchment, MD     PMH Acute saddle PE/left LE DVT 2021 on chronic anticoagulation RBBB Memory impairment and anxiety Coronary artery calcification seen on CT 07/08/2019 Hypertension  Initially seen by cardiology in 2018 for abnormal EKG.  Family history of ASCVD including several family members who had strokes in their 30s, father had MI at age 102.  He is a former smoker for approximately 10 years, 1 pack/day.  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  EKG revealed T wave inversions noted in inferior leads.  Exercise Myoview  was performed to ensure no ischemia and was low risk.  Seen again in 2021 following admission for acute saddle pulmonary embolus and left lower extremity DVT 05/07/2019.  Initially treated with IV heparin  and then transition to Xarelto .  IVC filter was placed and left femoral vein to distal DVT.  Echo revealed mildly dilated right ventricle but overall normal systolic function and normal LV function.  IVC filter removed 08/12/2019 and follow-up Doppler showed no evidence of DVT.  Abdominal CT was unremarkable.  Last cardiology clinic visit was 12/30/2021 with Dr. Parchment.  BP was soft at 100/60.  He noted occasional dizziness when standing.  He was taking lorazepam  1 mg at bedtime and 0.5 mg during the day.  He reported terrible sleep quality and difficulty adjusting his BiPAP so he had stopped using it.  He remained on maintenance dose Xarelto  10 mg daily.  Amlodipine  was discontinued and he was advised to follow-up in 6 months.  History of Present Illness  Discussed the use of AI scribe software for clinical note transcription with the patient, who gave verbal consent to proceed.  History of Present Illness James Noble  is an 83 year old male who presents for pre-operative cardiac evaluation prior to hernia repair surgery. He is accompanied by a caregiver from [place] where he resides in the memory care unit. He denies pain of any type. He is able to answer some questions but also provides indistinguishable responses to some question. He denies recent cardiac concerns such as chest pain, shortness of breath, lightheadedness, or bleeding issues. He experiences occasional irregular heartbeats, described as a 'pounding sensation', without dizziness or syncope. He has bilateral leg swelling but no shortness of breath during physical activities. He is able to walk independently and perform daily activities without difficulty. He sleeps well and denies orthopnea or PND. The caregiver states he walks all day long and participates in exercise classes. His wife is in independent living at [facility].    ROS: See HPI  Studies Reviewed EKG Interpretation Date/Time:  Monday December 10 2023 14:10:27 EDT Ventricular Rate:  80 PR Interval:  166 QRS Duration:  116 QT Interval:  392 QTC Calculation: 452 R Axis:   95  Text Interpretation: Sinus rhythm with marked sinus arrhythmia Rightward axis Incomplete right bundle branch block Nonspecific T wave abnormality Confirmed by Percy Browning [phone]) on 12/10/2023 2:25:52 PM     No results found for: LIPOA  Risk Assessment/Calculations           Physical Exam VS:  BP (!) 92/50   Pulse (!) 44   Ht 5' 11 (1.803 m)   Wt 163 lb 6.4 oz (74.1  kg)   SpO2 95%   BMI 22.79 kg/m    Wt Readings from Last 3 Encounters:  12/10/23 163 lb 6.4 oz (74.1 kg)  07/29/23 200 lb (90.7 kg)  12/30/21 199 lb (90.3 kg)    GEN: Well nourished, well developed in no acute distress NECK: No JVD; No carotid bruits CARDIAC: RRR, no murmurs, rubs, gallops RESPIRATORY:  Clear to auscultation without rales, wheezing or rhonchi  ABDOMEN: Soft, non-tender, non-distended EXTREMITIES:   Bilateral non-pitting LE edema; No deformity   Assessment & Plan Preoperative cardiac evaluation He is planning to undergo hernia with Dr. Belinda on date TBD.  He is able to achieve  > 4 METS activity with regular walking, ADLs, and exercise classes. According to the Revised Cardiac Risk Index (RCRI), his Perioperative Risk of Major Cardiac Event is (%): 0.4. His Functional Capacity in METs is: 5.13 according to the Duke Activity Status Index (DASI). The patient is doing well from a cardiac perspective. Therefore, based on ACC/AHA guidelines, the patient would be at acceptable risk for the planned procedure without further cardiovascular testing.  Per office protocol, he may hold Xarelto  for 2 days prior to procedure.  I will forward recommendations to requesting provider.  Lower extremity edema   Bilateral LE edema with sock indentations. Per caregiver, he is ambulatory for the majority of the day, rarely sitting. He denies shortness of breath, orthopnea, PND, or chest pain. He appears euvolemic on exam.  -Encouraged leg elevation when possible -Consider compression stockings  -Low salt diet  Coronary artery calcification on CT EKG today without acute ST/T abnormality. He denies chest pain, dyspnea, or other symptoms concerning for angina.  No indication for further ischemic evaluation at this time.   History of PE/DVT On lifetime anticoagulation with Xarelto . He denies bleeding concerns. He does have LE edema. Caregiver is not aware if this is chronic or new but states he ambulates up and down the halls all day; he is rarely sedentary. No shortness of breath or other symptoms concerning for clot. -Continue Xarelto  with exception for surgery hold as noted above         Dispo: As needed  Signed, Rosaline Bane, NP-C

## 2023-12-10 ENCOUNTER — Ambulatory Visit (HOSPITAL_BASED_OUTPATIENT_CLINIC_OR_DEPARTMENT_OTHER): Admitting: Nurse Practitioner

## 2023-12-10 VITALS — BP 92/50 | HR 44 | Ht 71.0 in | Wt 163.4 lb

## 2023-12-10 DIAGNOSIS — Z7901 Long term (current) use of anticoagulants: Secondary | ICD-10-CM | POA: Diagnosis not present

## 2023-12-10 DIAGNOSIS — Z01818 Encounter for other preprocedural examination: Secondary | ICD-10-CM | POA: Diagnosis not present

## 2023-12-10 DIAGNOSIS — Z86718 Personal history of other venous thrombosis and embolism: Secondary | ICD-10-CM

## 2023-12-10 DIAGNOSIS — I2510 Atherosclerotic heart disease of native coronary artery without angina pectoris: Secondary | ICD-10-CM

## 2023-12-10 DIAGNOSIS — R600 Localized edema: Secondary | ICD-10-CM

## 2023-12-10 DIAGNOSIS — Z86711 Personal history of pulmonary embolism: Secondary | ICD-10-CM

## 2023-12-10 NOTE — Patient Instructions (Signed)
 Medication Instructions:   Your physician recommends that you continue on your current medications as directed. Please refer to the Current Medication list given to you today.   *If you need a refill on your cardiac medications before your next appointment, please call your pharmacy*  Lab Work:  None ordered.  If you have labs (blood work) drawn today and your tests are completely normal, you will receive your results only by: MyChart Message (if you have MyChart) OR A paper copy in the mail If you have any lab test that is abnormal or we need to change your treatment, we will call you to review the results.  Testing/Procedures:  None ordered.  Follow-Up: At [facility], you and your health needs are our priority.  As part of our continuing mission to provide you with exceptional heart care, our providers are all part of one team.  This team includes your primary Cardiologist (physician) and Advanced Practice Providers or APPs (Physician Assistants and Nurse Practitioners) who all work together to provide you with the care you need, when you need it.  Your next appointment:    As needed

## 2023-12-11 ENCOUNTER — Encounter (HOSPITAL_BASED_OUTPATIENT_CLINIC_OR_DEPARTMENT_OTHER): Payer: Self-pay | Admitting: Nurse Practitioner

## 2023-12-12 DIAGNOSIS — K219 Gastro-esophageal reflux disease without esophagitis: Secondary | ICD-10-CM | POA: Diagnosis not present

## 2023-12-12 DIAGNOSIS — F39 Unspecified mood [affective] disorder: Secondary | ICD-10-CM | POA: Diagnosis not present

## 2023-12-12 DIAGNOSIS — I739 Peripheral vascular disease, unspecified: Secondary | ICD-10-CM | POA: Diagnosis not present

## 2023-12-12 DIAGNOSIS — L603 Nail dystrophy: Secondary | ICD-10-CM | POA: Diagnosis not present

## 2023-12-12 DIAGNOSIS — L602 Onychogryphosis: Secondary | ICD-10-CM | POA: Diagnosis not present

## 2023-12-12 DIAGNOSIS — E785 Hyperlipidemia, unspecified: Secondary | ICD-10-CM | POA: Diagnosis not present

## 2023-12-12 DIAGNOSIS — L84 Corns and callosities: Secondary | ICD-10-CM | POA: Diagnosis not present

## 2023-12-18 ENCOUNTER — Encounter (HOSPITAL_COMMUNITY): Payer: Self-pay | Admitting: Surgery

## 2023-12-18 NOTE — Progress Notes (Incomplete)
 PCP - Dr Roetta Dare Cardiologist - Dr Oneil Parchment (Clearance 12/10/23)  Chest x-ray - 07/29/23 EKG - 12/10/23 Stress Test - 02/08/17 ECHO - 05/07/19 Cardiac Cath - n/a  ICD Pacemaker/Loop - n/a  Sleep Study -  Yes BiPAP - not using  Diabetes - n/a  Blood Thinner Instructions:  Hold Xarelto  for 2/3   days prior to surgery.  Last dose was on   Aspirin Instructions: n/a  ERAS -clear liquids til 12:30 PM DOS.  Anesthesia review: Yes  STOP now taking any Aspirin (unless otherwise instructed by your surgeon), Aleve , Naproxen , Ibuprofen, Motrin, Advil, Goody's, BC's, all herbal medications, fish oil, and all vitamins.   Coronavirus Screening Do you have any of the following symptoms:  Cough yes/no: No Fever (>100.71F)  yes/no: No Runny nose yes/no: No Sore throat yes/no: No Difficulty breathing/shortness of breath  yes/no: No  Have you traveled in the last 14 days and where? yes/no: No  Patient's Son     verbalized understanding of instructions that were given via phone.

## 2023-12-19 ENCOUNTER — Encounter (HOSPITAL_COMMUNITY): Payer: Self-pay | Admitting: Surgery

## 2023-12-19 ENCOUNTER — Other Ambulatory Visit: Payer: Self-pay

## 2023-12-19 NOTE — Anesthesia Preprocedure Evaluation (Signed)
 Anesthesia Evaluation  Patient identified by MRN, date of birth, ID band Patient awake and Patient confused    Reviewed: Allergy & Precautions, NPO status , Patient's Chart, lab work & pertinent test results, Unable to perform ROS - Chart review only  History of Anesthesia Complications Negative for: history of anesthetic complications  Airway Mallampati: II  TM Distance: >3 FB Neck ROM: Limited   Comment: Ankylosed neck Dental  (+) Dental Advisory Given   Pulmonary former smoker   breath sounds clear to auscultation       Cardiovascular hypertension, Pt. on medications + DVT   Rhythm:Regular Rate:Normal  '21 ECHO: EF 55 to 60%.  1. The LV has normal function. There is moderately increased LVH.   2. Moderate asymmetric hypertrophy measuring 16mm in basal septum (9mm in  posterior wall)   3. Global RV has normal systolic function.The right ventricular size is mildly enlarged.   4. The mitral valve is normal in structure. No evidence of mitral valve regurgitation.   5. The aortic valve is tricuspid. Aortic valve regurgitation is trivial. Mild aortic valve sclerosis without stenosis.   6. The tricuspid valve is normal in structure.   7. The pulmonic valve was not well visualized. Pulmonic valve regurgitation is trivial.     Neuro/Psych   Anxiety Depression   Dementia    GI/Hepatic Neg liver ROS,GERD  Medicated,,  Endo/Other  negative endocrine ROS    Renal/GU negative Renal ROS     Musculoskeletal   Abdominal   Peds  Hematology Xarelto : last 12/17/2023   Anesthesia Other Findings   Reproductive/Obstetrics                              Anesthesia Physical Anesthesia Plan  ASA: 3  Anesthesia Plan: General   Post-op Pain Management: Regional block* and Ofirmev  IV (intra-op)*   Induction: Intravenous  PONV Risk Score and Plan: 2 and Ondansetron  and Dexamethasone   Airway Management  Planned: Oral ETT and Video Laryngoscope Planned  Additional Equipment: None  Intra-op Plan:   Post-operative Plan: Extubation in OR  Informed Consent: I have reviewed the patients History and Physical, chart, labs and discussed the procedure including the risks, benefits and alternatives for the proposed anesthesia with the patient or authorized representative who has indicated his/her understanding and acceptance.     Consent reviewed with POA, Dental advisory given and History available from chart only  Plan Discussed with: CRNA and Surgeon  Anesthesia Plan Comments: (PAT note written 12/19/2023 by Vrishank Moster, PA-C. Discussed with patient's wife, James Noble, by telephone )         Anesthesia Quick Evaluation

## 2023-12-19 NOTE — Progress Notes (Signed)
 Anesthesia Chart Review: James Noble  Case: [id number] Date/Time: 12/20/23 1515   Procedure: REPAIR, HERNIA, INGUINAL, ADULT (Left) - LEFT INGUINAL HERNIA REPAIR WITH MESH   Anesthesia type: General   Diagnosis: Left inguinal hernia [K40.90]   Pre-op diagnosis: LEFT INGUINAL HERNIA   [facility] / [facility]   Surgeons: James Cough, MD      OR Special Needs: PATIENT RESIDES AT [facility] WILL NEED TO SPEAK WITH WIFE James Noble   DISCUSSION: Patient is an 83 year old male scheduled for the above procedure.  History includes former smoker (quit 1970), HTN, HLD, CAD (coronary calcifications CT 07/08/2019), RBBB, saddle PE/LLE DVT (05/07/2019; IVF filter 05/08/2019-08/12/2019), hiatal hernia, skin cancer (BCC), anxiety, dementia.   Preoperative cardiology evaluation on 12/10/2023 by James Browning, NP: He is planning to undergo hernia with Dr. Belinda on date TBD.  He is able to achieve  > 4 METS activity with regular walking, ADLs, and exercise classes. According to the Revised Cardiac Risk Index (RCRI), his Perioperative Risk of Major Cardiac Event is (%): 0.4. His Functional Capacity in METs is: 5.13 according to the Duke Activity Status Index (DASI). The patient is doing well from a cardiac perspective. Therefore, based on ACC/AHA guidelines, the patient would be at acceptable risk for the planned procedure without further cardiovascular testing.  Per office protocol, he may hold Xarelto  for 2 days prior to procedure.  Last Xarelto  reported as 12/17/2023.  Anesthesia to evaluate on the day of surgery.  VS: Ht 5' 11 (1.803 m)   Wt 74.1 kg   BMI 22.78 kg/m   BP Readings from Last 3 Encounters:  12/10/23 (!) 92/50  11/08/23 119/69  07/29/23 111/69   Pulse Readings from Last 3 Encounters:  12/10/23 (!) 44  11/08/23 68  07/29/23 (!) 51     PROVIDERS: James Dirks, MD is PCP  James Anes, MD is cardiologist    LABS: For day of surgery as indicated. Most recent results in  [facility] include: Lab Results  Component Value Date   WBC 5.9 11/08/2023   HGB 12.5 (L) 11/08/2023   HCT 38.3 (L) 11/08/2023   PLT 134 (L) 11/08/2023   GLUCOSE 110 (H) 11/08/2023   ALT 50 (H) 07/29/2023   AST 72 (H) 07/29/2023   NA 140 11/08/2023   K 3.8 11/08/2023   CL 104 11/08/2023   CREATININE 0.85 11/08/2023   BUN 16 11/08/2023   CO2 28 11/08/2023   INR 2.2 (H) 07/29/2023    IMAGES: CT Abd/pelvis 11/08/2023: IMPRESSION: - Moderate-sized left inguinal hernia containing sigmoid colon. No bowel obstruction. - Moderate stool burden throughout the colon. - Aortic atherosclerosis.  1V PCXR 07/29/2023: FINDINGS: Aortic atherosclerotic calcification. Heart size and mediastinal contours are unremarkable. Lungs are suboptimally inflated but clear. No pleural effusion or airspace consolidation. The visualized osseous structures appear intact. IMPRESSION: 1. No acute cardiopulmonary disease. 2. Aortic Atherosclerosis (ICD10-I70.0).     EKG: 12/10/2023:  Sinus rhythm with marked sinus arrhythmia Rightward axis Incomplete right bundle branch block Nonspecific T wave abnormality Confirmed by James Noble [phone]) on 12/10/2023 2:25:52 PM   CV: TTE 05/07/2019 (in setting of acute bilateral PE): IMPRESSIONS   1. Left ventricular ejection fraction, by visual estimation, is 55 to  60%. The left ventricle has normal function. There is moderately increased  left ventricular hypertrophy.   2. Moderate asymmetric hypertrophy measuring 16mm in basal septum (9mm in  posterior wall)   3. Global right ventricle has normal systolic function.The right  ventricular  size is mildly enlarged.   4. The mitral valve is normal in structure. No evidence of mitral valve  regurgitation.   5. The aortic valve is tricuspid. Aortic valve regurgitation is trivial.  Mild aortic valve sclerosis without stenosis.   6. The tricuspid valve is normal in structure.   7. The pulmonic valve was not well  visualized. Pulmonic valve  regurgitation is trivial.   8. Left atrial size was normal.   9. Right atrial size was normal.  10. The inferior vena cava is normal in size with greater than 50%  respiratory variability, suggesting right atrial pressure of 3 mmHg.  11. TR signal is inadequate for assessing pulmonary artery systolic  pressure.  12. Thrombus seen at the bifurcation of the pulmonary arteries.   Nuclear stress test 02/08/2017: Nuclear stress EF: 57%. Blood pressure demonstrated a normal response to exercise. There was no ST segment deviation noted during stress. No T wave inversion was noted during stress. Defect 1: There is a medium defect of moderate severity. This is a low risk study.   Medium sized, moderate intensity fixed inferolateral perfusion defect with normal wall motion, suspicious of artifact. No reversible ischemia. LVEF 57%. This is a low risk study.    Past Medical History:  Diagnosis Date   Anxiety    Cancer (HCC)    BASAL CELL , DR.LOMAX   Coronary artery disease    DVT (deep venous thrombosis) (HCC) 05/08/2019   a. L sided DVT 05/2019, s/p IVC filter 05/08/19.   Dysrhythmia    Hx RBBB   Hiatal hernia    Hx of colonic polyps    DR.MEDOFF   Hyperlipidemia    BASED ON NMR;GILBERTS SYNDROME   Hypertension    Hyperuricemia without signs inflammatory arthritis/tophaceous disease 2012   uric acid 7.7   Memory impairment    Saddle pulmonary embolus (HCC) 05/2019   Sciatica    Spinal stenosis    OF LS SPINE...DR.LOVE    Past Surgical History:  Procedure Laterality Date   colonoscopy with polypectomy  2005 & 2008   Dr James Noble   HEMORRHOID SURGERY  1986   INGUINAL HERNIA REPAIR  1968   IR IVC FILTER PLMT / S&I /IMG GUID/MOD SED  05/08/2019   IR IVC FILTER RETRIEVAL / S&I /IMG GUID/MOD SED  08/12/2019   IR RADIOLOGIST EVAL & MGMT  05/21/2019   IR RADIOLOGIST EVAL & MGMT  07/31/2019   steroid spinal injections  2013   X2, Dr James Noble   TONSILLECTOMY AND  ADENOIDECTOMY  1949   UPPER GASTROINTESTINAL ENDOSCOPY  2008   Dr James Noble; Hiatal hernia   VASECTOMY      MEDICATIONS: No current facility-administered medications for this encounter.    acetaminophen  (TYLENOL ) 500 MG tablet   donepezil (ARICEPT) 5 MG tablet   famotidine (PEPCID) 40 MG tablet   fluticasone (FLONASE) 50 MCG/ACT nasal spray   guaiFENesin (MUCINEX) 600 MG 12 hr tablet   LORazepam  (ATIVAN ) 0.5 MG tablet   losartan  (COZAAR ) 50 MG tablet   nortriptyline (PAMELOR) 25 MG capsule   rivaroxaban  (XARELTO ) 10 MG TABS tablet   rosuvastatin  (CRESTOR ) 20 MG tablet    Isaiah Ruder, PA-C Surgical Short Stay/Anesthesiology [facility] Phone [phone] [facility] Phone [phone] 12/19/2023 11:39 AM

## 2023-12-19 NOTE — Progress Notes (Signed)
 Wife Glendale [phone]) stated she would not be here on DOS but she has arranged for a friend to be here at the hospital when the patient arrives.  [facility] transportation dept will be bringing patient to the hospital on DOS.  Glendale informed that patient needs to arrive at 1:00 PM on DOS.  Glendale verbalized understanding.

## 2023-12-19 NOTE — Progress Notes (Signed)
 Surgical Instructions for James Noble    Your procedure is scheduled on 12/20/23.  Report to [facility] Main Entrance A at 1:00 PM., then check in with the Admitting office.  Call this number if you have problems the morning of surgery:  [phone]   If you have any questions prior to your surgery date call [phone]: Open Monday-Friday 8am-4pm If you experience any cold or flu symptoms such as cough, fever, chills, shortness of breath, etc. between now and your scheduled surgery, please notify us  at the above number     Remember:  Do not eat after midnight the night before your surgery-tonight  You may drink clear liquids until 12:30 PM, the afternoon of your surgery.   Clear liquids allowed are: Water, Non-Citrus Juices (without pulp), Carbonated Beverages, Clear Tea, Black Coffee ONLY (NO MILK, CREAM OR POWDERED CREAMER of any kind), and Gatorade    Take these medicines the morning of surgery with A SIP OF WATER:  acetaminophen  (TYLENOL ) 500  LORazepam  (ATIVAN )  rosuvastatin  (CRESTOR )   Hold Xarelto  2 days prior to procedure.  Last dose was on 12/17/23.   As of today, STOP taking any Aspirin (unless otherwise instructed by your surgeon) Aleve , Naproxen , Ibuprofen, Motrin, Advil, Goody's, BC's, all herbal medications, fish oil, and all vitamins.           Do not wear jewelry  Do not wear lotions, powders, cologne or deodorant. Men may shave face and neck. Do not bring valuables to the hospital.  [facility] is not responsible for any belongings or valuables.     Contacts, glasses, hearing aids, dentures or partials may not be worn into surgery, please bring cases for these belongings    Patients discharged the day of surgery will not be allowed to drive home, and someone needs to stay with them for 24 hours.   SURGICAL WAITING ROOM VISITATION Patients having surgery or a procedure may have no more than 2 support people in the waiting area - these visitors may  rotate.   Children under the age of 23 must have an adult with them who is not the patient. If the patient needs to stay at the hospital during part of their recovery, the visitor guidelines for inpatient rooms apply. Pre-op nurse will coordinate an appropriate time for 1 support person to accompany patient in pre-op.  This support person may not rotate.   Please refer to [email or web address] for the visitor guidelines for Inpatients (after your surgery is over and you are in a regular room).    Special instructions:    Oral Hygiene is also important to reduce your risk of infection.  Remember - BRUSH YOUR TEETH THE MORNING OF SURGERY WITH YOUR REGULAR TOOTHPASTE

## 2023-12-20 ENCOUNTER — Ambulatory Visit (HOSPITAL_COMMUNITY): Payer: Self-pay | Admitting: Vascular Surgery

## 2023-12-20 ENCOUNTER — Encounter (HOSPITAL_COMMUNITY)
Admission: RE | Disposition: A | Discharge status: Home / Self Care | Payer: Self-pay | Source: Home / Self Care | Attending: Surgery

## 2023-12-20 ENCOUNTER — Other Ambulatory Visit (HOSPITAL_COMMUNITY): Payer: Self-pay

## 2023-12-20 ENCOUNTER — Other Ambulatory Visit: Payer: Self-pay

## 2023-12-20 ENCOUNTER — Encounter (HOSPITAL_COMMUNITY): Payer: Self-pay | Admitting: Surgery

## 2023-12-20 ENCOUNTER — Ambulatory Visit (HOSPITAL_BASED_OUTPATIENT_CLINIC_OR_DEPARTMENT_OTHER): Payer: Self-pay | Admitting: Vascular Surgery

## 2023-12-20 ENCOUNTER — Ambulatory Visit (HOSPITAL_COMMUNITY)
Admission: RE | Admit: 2023-12-20 | Discharge: 2023-12-20 | Disposition: A | Discharge status: Home / Self Care | Attending: Surgery | Admitting: Surgery

## 2023-12-20 DIAGNOSIS — I2510 Atherosclerotic heart disease of native coronary artery without angina pectoris: Secondary | ICD-10-CM | POA: Insufficient documentation

## 2023-12-20 DIAGNOSIS — F32A Depression, unspecified: Secondary | ICD-10-CM | POA: Diagnosis not present

## 2023-12-20 DIAGNOSIS — I4510 Unspecified right bundle-branch block: Secondary | ICD-10-CM | POA: Diagnosis not present

## 2023-12-20 DIAGNOSIS — F419 Anxiety disorder, unspecified: Secondary | ICD-10-CM | POA: Insufficient documentation

## 2023-12-20 DIAGNOSIS — Z87891 Personal history of nicotine dependence: Secondary | ICD-10-CM

## 2023-12-20 DIAGNOSIS — Z79899 Other long term (current) drug therapy: Secondary | ICD-10-CM | POA: Diagnosis not present

## 2023-12-20 DIAGNOSIS — G8918 Other acute postprocedural pain: Secondary | ICD-10-CM | POA: Diagnosis not present

## 2023-12-20 DIAGNOSIS — K219 Gastro-esophageal reflux disease without esophagitis: Secondary | ICD-10-CM | POA: Insufficient documentation

## 2023-12-20 DIAGNOSIS — K4090 Unilateral inguinal hernia, without obstruction or gangrene, not specified as recurrent: Secondary | ICD-10-CM | POA: Diagnosis not present

## 2023-12-20 DIAGNOSIS — F418 Other specified anxiety disorders: Secondary | ICD-10-CM

## 2023-12-20 DIAGNOSIS — F0394 Unspecified dementia, unspecified severity, with anxiety: Secondary | ICD-10-CM | POA: Diagnosis not present

## 2023-12-20 DIAGNOSIS — Z86718 Personal history of other venous thrombosis and embolism: Secondary | ICD-10-CM | POA: Diagnosis not present

## 2023-12-20 DIAGNOSIS — Z7901 Long term (current) use of anticoagulants: Secondary | ICD-10-CM | POA: Diagnosis not present

## 2023-12-20 DIAGNOSIS — E785 Hyperlipidemia, unspecified: Secondary | ICD-10-CM | POA: Insufficient documentation

## 2023-12-20 DIAGNOSIS — I10 Essential (primary) hypertension: Secondary | ICD-10-CM | POA: Insufficient documentation

## 2023-12-20 DIAGNOSIS — K449 Diaphragmatic hernia without obstruction or gangrene: Secondary | ICD-10-CM | POA: Insufficient documentation

## 2023-12-20 DIAGNOSIS — Z86711 Personal history of pulmonary embolism: Secondary | ICD-10-CM | POA: Insufficient documentation

## 2023-12-20 HISTORY — DX: Atherosclerotic heart disease of native coronary artery without angina pectoris: I25.10

## 2023-12-20 HISTORY — DX: Cardiac arrhythmia, unspecified: I49.9

## 2023-12-20 HISTORY — DX: Essential (primary) hypertension: I10

## 2023-12-20 HISTORY — DX: Benign prostatic hyperplasia without lower urinary tract symptoms: N40.0

## 2023-12-20 HISTORY — DX: Other amnesia: R41.3

## 2023-12-20 HISTORY — DX: Depression, unspecified: F32.A

## 2023-12-20 HISTORY — PX: INGUINAL HERNIA REPAIR: SHX194

## 2023-12-20 HISTORY — DX: Anxiety disorder, unspecified: F41.9

## 2023-12-20 HISTORY — DX: Gastro-esophageal reflux disease without esophagitis: K21.9

## 2023-12-20 HISTORY — DX: Other seasonal allergic rhinitis: J30.2

## 2023-12-20 HISTORY — DX: Insomnia, unspecified: G47.00

## 2023-12-20 LAB — CBC
HCT: 37.5 % — ABNORMAL LOW (ref 39.0–52.0)
Hemoglobin: 12.2 g/dL — ABNORMAL LOW (ref 13.0–17.0)
MCH: 32.8 pg (ref 26.0–34.0)
MCHC: 32.5 g/dL (ref 30.0–36.0)
MCV: 100.8 fL — ABNORMAL HIGH (ref 80.0–100.0)
Platelets: 155 K/uL (ref 150–400)
RBC: 3.72 MIL/uL — ABNORMAL LOW (ref 4.22–5.81)
RDW: 13.5 % (ref 11.5–15.5)
WBC: 6.1 K/uL (ref 4.0–10.5)
nRBC: 0 % (ref 0.0–0.2)

## 2023-12-20 LAB — BASIC METABOLIC PANEL WITH GFR
Anion gap: 11 (ref 5–15)
BUN: 15 mg/dL (ref 8–23)
CO2: 23 mmol/L (ref 22–32)
Calcium: 8.8 mg/dL — ABNORMAL LOW (ref 8.9–10.3)
Chloride: 110 mmol/L (ref 98–111)
Creatinine, Ser: 0.92 mg/dL (ref 0.61–1.24)
GFR, Estimated: >60 mL/min (ref >60)
Glucose, Bld: 94 mg/dL (ref 70–99)
Potassium: 4 mmol/L (ref 3.5–5.1)
Sodium: 144 mmol/L (ref 135–145)

## 2023-12-20 SURGERY — REPAIR, HERNIA, INGUINAL, ADULT
Anesthesia: General | Laterality: Left

## 2023-12-20 MED ORDER — FENTANYL CITRATE (PF) 100 MCG/2ML IJ SOLN: 25.0000 ug | INTRAMUSCULAR | Status: DC | PRN

## 2023-12-20 MED ORDER — BUPIVACAINE-EPINEPHRINE (PF) 0.5% -1:200000 IJ SOLN
INTRAMUSCULAR | Status: DC | PRN
  Administered 2023-12-20: 25 mL

## 2023-12-20 MED ORDER — MIDAZOLAM HCL 2 MG/2ML IJ SOLN
INTRAMUSCULAR | Status: AC
  Filled 2023-12-20: qty 2

## 2023-12-20 MED ORDER — LIDOCAINE 2% (20 MG/ML) 5 ML SYRINGE
INTRAMUSCULAR | Status: DC | PRN
  Administered 2023-12-20: 40 mg via INTRAVENOUS

## 2023-12-20 MED ORDER — LIDOCAINE 2% (20 MG/ML) 5 ML SYRINGE
INTRAMUSCULAR | Status: AC
  Filled 2023-12-20: qty 10

## 2023-12-20 MED ORDER — ACETAMINOPHEN 10 MG/ML IV SOLN
INTRAVENOUS | Status: DC | PRN
Start: 2023-12-20 — End: 2023-12-20
  Administered 2023-12-20: 1000 mg via INTRAVENOUS

## 2023-12-20 MED ORDER — OXYCODONE HCL 5 MG/5ML PO SOLN: 5.0000 mg | Freq: Once | ORAL | Status: DC | PRN

## 2023-12-20 MED ORDER — TRAMADOL HCL 50 MG PO TABS
100.0000 mg | ORAL_TABLET | Freq: Four times a day (QID) | ORAL | 0 refills | Status: AC | PRN
  Filled 2023-12-20: qty 20, 3d supply, fill #0

## 2023-12-20 MED ORDER — ACETAMINOPHEN 10 MG/ML IV SOLN
INTRAVENOUS | Status: AC
  Filled 2023-12-20: qty 100

## 2023-12-20 MED ORDER — EPHEDRINE SULFATE-NACL 50-0.9 MG/10ML-% IV SOSY
PREFILLED_SYRINGE | INTRAVENOUS | Status: DC | PRN
  Administered 2023-12-20 (×2): 5 mg via INTRAVENOUS

## 2023-12-20 MED ORDER — ONDANSETRON HCL 4 MG/2ML IJ SOLN
INTRAMUSCULAR | Status: DC | PRN
  Administered 2023-12-20: 4 mg via INTRAVENOUS

## 2023-12-20 MED ORDER — OXYCODONE HCL 5 MG PO TABS: 5.0000 mg | ORAL_TABLET | Freq: Once | ORAL | Status: DC | PRN

## 2023-12-20 MED ORDER — CHLORHEXIDINE GLUCONATE CLOTH 2 % EX PADS: 6.0000 | MEDICATED_PAD | Freq: Once | CUTANEOUS | Status: DC

## 2023-12-20 MED ORDER — CHLORHEXIDINE GLUCONATE 0.12 % MT SOLN
15.0000 mL | Freq: Once | OROMUCOSAL | Status: AC
  Administered 2023-12-20: 15 mL via OROMUCOSAL
  Filled 2023-12-20: qty 15

## 2023-12-20 MED ORDER — PHENYLEPHRINE 80 MCG/ML (10ML) SYRINGE FOR IV PUSH (FOR BLOOD PRESSURE SUPPORT)
PREFILLED_SYRINGE | INTRAVENOUS | Status: DC | PRN
  Administered 2023-12-20: 160 ug via INTRAVENOUS
  Administered 2023-12-20: 80 ug via INTRAVENOUS

## 2023-12-20 MED ORDER — FENTANYL CITRATE (PF) 100 MCG/2ML IJ SOLN
INTRAMUSCULAR | Status: AC
  Administered 2023-12-20: 50 ug via INTRAVENOUS
  Filled 2023-12-20: qty 2

## 2023-12-20 MED ORDER — MIDAZOLAM HCL 2 MG/2ML IJ SOLN: 0.5000 mg | Freq: Once | INTRAMUSCULAR | Status: DC | PRN

## 2023-12-20 MED ORDER — ACETAMINOPHEN 500 MG PO TABS
1000.0000 mg | ORAL_TABLET | ORAL | Status: AC
  Administered 2023-12-20: 1000 mg via ORAL
  Filled 2023-12-20: qty 2

## 2023-12-20 MED ORDER — ORAL CARE MOUTH RINSE: 15.0000 mL | Freq: Once | OROMUCOSAL | Status: AC

## 2023-12-20 MED ORDER — PROPOFOL 10 MG/ML IV BOLUS
INTRAVENOUS | Status: DC | PRN
  Administered 2023-12-20: 70 mg via INTRAVENOUS

## 2023-12-20 MED ORDER — FENTANYL CITRATE (PF) 100 MCG/2ML IJ SOLN: 50.0000 ug | Freq: Once | INTRAMUSCULAR | Status: AC

## 2023-12-20 MED ORDER — BUPIVACAINE-EPINEPHRINE (PF) 0.25% -1:200000 IJ SOLN
INTRAMUSCULAR | Status: AC
  Filled 2023-12-20: qty 30

## 2023-12-20 MED ORDER — DEXAMETHASONE SODIUM PHOSPHATE 10 MG/ML IJ SOLN
INTRAMUSCULAR | Status: DC | PRN
  Administered 2023-12-20: 5 mg via INTRAVENOUS

## 2023-12-20 MED ORDER — MEPERIDINE HCL 25 MG/ML IJ SOLN: 6.2500 mg | INTRAMUSCULAR | Status: DC | PRN

## 2023-12-20 MED ORDER — ROCURONIUM BROMIDE 10 MG/ML (PF) SYRINGE
PREFILLED_SYRINGE | INTRAVENOUS | Status: AC
Start: 2023-12-20 — End: 2023-12-20
  Filled 2023-12-20: qty 20

## 2023-12-20 MED ORDER — CEFAZOLIN SODIUM-DEXTROSE 2-4 GM/100ML-% IV SOLN
2.0000 g | INTRAVENOUS | Status: AC
  Administered 2023-12-20: 2 g via INTRAVENOUS
  Filled 2023-12-20: qty 100

## 2023-12-20 MED ORDER — SUCCINYLCHOLINE CHLORIDE 200 MG/10ML IV SOSY
PREFILLED_SYRINGE | INTRAVENOUS | Status: DC | PRN
  Administered 2023-12-20: 100 mg via INTRAVENOUS

## 2023-12-20 MED ORDER — 0.9 % SODIUM CHLORIDE (POUR BTL) OPTIME
TOPICAL | Status: DC | PRN
  Administered 2023-12-20: 1000 mL

## 2023-12-20 MED ORDER — LACTATED RINGERS IV SOLN: INTRAVENOUS | Status: DC

## 2023-12-20 MED ORDER — BUPIVACAINE-EPINEPHRINE 0.25% -1:200000 IJ SOLN
INTRAMUSCULAR | Status: DC | PRN
  Administered 2023-12-20: 10 mL

## 2023-12-20 SURGICAL SUPPLY — 36 items
BAG COUNTER SPONGE SURGICOUNT (BAG) ×1 IMPLANT
BENZOIN TINCTURE PRP APPL 2/3 (GAUZE/BANDAGES/DRESSINGS) ×1 IMPLANT
BLADE CLIPPER SURG (BLADE) IMPLANT
CHLORAPREP W/TINT 26 (MISCELLANEOUS) ×1 IMPLANT
COVER SURGICAL LIGHT HANDLE (MISCELLANEOUS) ×1 IMPLANT
DRAIN PENROSE .5X12 LATEX STL (DRAIN) ×1 IMPLANT
DRAPE LAPAROSCOPIC ABDOMINAL (DRAPES) IMPLANT
DRAPE LAPAROTOMY TRNSV 102X78 (DRAPES) IMPLANT
DRSG TEGADERM 4X4.75 (GAUZE/BANDAGES/DRESSINGS) ×1 IMPLANT
ELECT CAUTERY BLADE 6.4 (BLADE) IMPLANT
ELECTRODE REM PT RTRN 9FT ADLT (ELECTROSURGICAL) ×1 IMPLANT
GAUZE 4X4 16PLY ~~LOC~~+RFID DBL (SPONGE) ×1 IMPLANT
GAUZE SPONGE 4X4 12PLY STRL (GAUZE/BANDAGES/DRESSINGS) ×1 IMPLANT
GLOVE BIO SURGEON STRL SZ7 (GLOVE) ×1 IMPLANT
GLOVE BIOGEL PI IND STRL 7.5 (GLOVE) ×1 IMPLANT
GOWN STRL REUS W/ TWL LRG LVL3 (GOWN DISPOSABLE) ×2 IMPLANT
KIT BASIN OR (CUSTOM PROCEDURE TRAY) ×1 IMPLANT
KIT TURNOVER KIT B (KITS) ×1 IMPLANT
MESH PARIETEX PROGRIP LEFT (Mesh General) IMPLANT
NDL HYPO 25GX1X1/2 BEV (NEEDLE) ×1 IMPLANT
NEEDLE HYPO 25GX1X1/2 BEV (NEEDLE) ×1 IMPLANT
NS IRRIG 1000ML POUR BTL (IV SOLUTION) ×1 IMPLANT
PACK GENERAL/GYN (CUSTOM PROCEDURE TRAY) ×1 IMPLANT
PAD ARMBOARD POSITIONER FOAM (MISCELLANEOUS) ×1 IMPLANT
SPONGE INTESTINAL PEANUT (DISPOSABLE) ×1 IMPLANT
STRIP CLOSURE SKIN 1/2X4 (GAUZE/BANDAGES/DRESSINGS) ×1 IMPLANT
SUT MNCRL AB 4-0 PS2 18 (SUTURE) ×1 IMPLANT
SUT PDS AB 0 CT 36 (SUTURE) IMPLANT
SUT SILK 2 0 SH (SUTURE) IMPLANT
SUT SILK 3-0 18XBRD TIE 12 (SUTURE) IMPLANT
SUT VIC AB 0 CT2 27 (SUTURE) ×1 IMPLANT
SUT VIC AB 2-0 SH 27X BRD (SUTURE) ×1 IMPLANT
SUT VIC AB 3-0 SH 27XBRD (SUTURE) ×1 IMPLANT
SYR CONTROL 10ML LL (SYRINGE) ×1 IMPLANT
TOWEL GREEN STERILE (TOWEL DISPOSABLE) ×1 IMPLANT
TOWEL GREEN STERILE FF (TOWEL DISPOSABLE) ×1 IMPLANT

## 2023-12-20 NOTE — Op Note (Signed)
 Left inguinal hernia repair with mesh procedure Note  Indications: This is an 83 year old male who resides at [facility] memory care who presents with a visible palpable bulge in his left groin.  The patient denies any significant discomfort but he does have significant memory issues.  The patient was evaluated recently in the emergency department for the swelling in his groin.  CT scan revealed a large left inguinal hernia containing a segment of sigmoid colon.  The hernia remains reducible.  The patient was referred to us  to discuss hernia repair.   The patient has a history of saddle pulmonary embolism and is on Xarelto .  He has no other apparent cardiac or pulmonary issues.   Pre-operative Diagnosis: left reducible inguinal hernia Post-operative Diagnosis: same  Surgeon: [name]   Assistants: none  Anesthesia: General endotracheal anesthesia  ASA Class: 3  Procedure Details  The patient was seen again in the Holding Room. The risks, benefits, complications, treatment options, and expected outcomes were discussed with the patient. The possibilities of reaction to medication, pulmonary aspiration, perforation of viscus, bleeding, recurrent infection, the need for additional procedures, and development of a complication requiring transfusion or further operation were discussed with the patient and/or family. The likelihood of success in repairing the hernia and returning the patient to their previous functional status is good.  There was concurrence with the proposed plan, and informed consent was obtained. The site of surgery was properly noted/marked. The patient was taken to the Operating Room, identified as James Noble, and the procedure verified as left inguinal hernia repair. A Time Out was held and the above information confirmed.  The patient was placed in the supine position and underwent induction of anesthesia. The lower abdomen and groin was prepped with Chloraprep and  draped in the standard fashion, and 0.25% Marcaine  with epinephrine  was used to anesthetize the skin over the mid-portion of the inguinal canal. An oblique incision was made. Dissection was carried down through the subcutaneous tissue with cautery to the external oblique fascia.  We opened the external oblique fascia along the direction of its fibers to the external ring.  The spermatic cord was circumferentially dissected bluntly and retracted with a Penrose drain.  The floor of the inguinal canal was inspected and was intact.  We skeletonized the spermatic cord and reduced a large indirect hernia sac and cord lipoma.  We used a left Progrip mesh which was inserted and deployed across the floor of the inguinal canal. The mesh was tucked underneath the external oblique fascia laterally.  The flap of the mesh was closed around the spermatic cord to recreate the internal inguinal ring.  The mesh was secured to the pubic tubercle with 0 Vicryl.  Additional stay sutures were placed in the inferior edge of the mesh to secure it to the shelving edge.  The external oblique fascia was reapproximated with 2-0 Vicryl.  3-0 Vicryl was used to close the subcutaneous tissues and 4-0 Monocryl was used to close the skin in subcuticular fashion.  Benzoin and steri-strips were used to seal the incision.  A clean dressing was applied.  The patient was then extubated and brought to the recovery room in stable condition.  All sponge, instrument, and needle counts were correct prior to closure and at the conclusion of the case.   Estimated Blood Loss: Minimal                 Complications: None; patient tolerated the procedure well.  Disposition: PACU - hemodynamically stable.         Condition: stable  Donnice POUR. Belinda, MD, [facility]  General Surgery   12/20/2023 4:59 PM

## 2023-12-20 NOTE — Progress Notes (Signed)
 [facility] called to let us  know that the patient received eggs this morning.  Dr. Leonce made awar and it will need to be 8hr delay.  Patient scheduled for 1530 so it should be ok.  Reminded the facility to please keep the patient NPO from this point forward.

## 2023-12-20 NOTE — Interval H&P Note (Signed)
 History and Physical Interval Note:  12/20/2023 1:09 PM  James Noble  has presented today for surgery, with the diagnosis of LEFT INGUINAL HERNIA.  The various methods of treatment have been discussed with the patient and family. After consideration of risks, benefits and other options for treatment, the patient has consented to  Procedure(s) with comments: REPAIR, HERNIA, INGUINAL, ADULT (Left) - LEFT INGUINAL HERNIA REPAIR WITH MESH as a surgical intervention.  The patient's history has been reviewed, patient examined, no change in status, stable for surgery.  I have reviewed the patient's chart and labs.  Questions were answered to the patient's satisfaction.     [name]

## 2023-12-20 NOTE — Transfer of Care (Signed)
 Immediate Anesthesia Transfer of Care Note  Patient: James Noble  Procedure(s) Performed: REPAIR, HERNIA, INGUINAL, ADULT (Left)  Patient Location: PACU  Anesthesia Type:GA combined with regional for post-op pain  Level of Consciousness: awake, drowsy, patient cooperative, and responds to stimulation  Airway & Oxygen Therapy: Patient Spontanous Breathing and Patient connected to face mask oxygen  Post-op Assessment: Report given to RN, Post -op Vital signs reviewed and stable, Patient moving all extremities X 4, and Patient able to stick tongue midline  Post vital signs: Reviewed and stable  Last Vitals:  Vitals Value Taken Time  BP 120/57 12/20/23 17:03  Temp 97.4   Pulse 68 12/20/23 17:05  Resp 13 12/20/23 17:05  SpO2 99 % 12/20/23 17:05  Vitals shown include unfiled device data.  Last Pain:  Vitals:   12/20/23 1334  TempSrc:   PainSc: 0-No pain      Patients Stated Pain Goal: 0 (12/20/23 1334)  Complications: No notable events documented.

## 2023-12-20 NOTE — Discharge Instructions (Signed)
 [facility] _______[facility]   INGUINAL HERNIA REPAIR: POST OP INSTRUCTIONS  Always review your discharge instruction sheet given to you by the facility where your surgery was performed. IF YOU HAVE DISABILITY OR FAMILY LEAVE FORMS, YOU MUST BRING THEM TO THE OFFICE FOR PROCESSING.   DO NOT GIVE THEM TO YOUR DOCTOR.  1. A  prescription for pain medication may be given to you upon discharge.  Take your pain medication as prescribed, if needed.  If narcotic pain medicine is not needed, then you may take acetaminophen  (Tylenol ) or ibuprofen (Advil) as needed. 2. Take your usually prescribed medications unless otherwise directed. If you need a refill on your pain medication, please contact your pharmacy.  They will contact our office to request authorization. Prescriptions will not be filled after 5 pm or on week-ends. 3. You should follow a light diet the first 24 hours after arrival home, such as soup and crackers, etc.  Be sure to include lots of fluids daily.  Resume your normal diet the day after surgery. 4.Most patients will experience some swelling and bruising in the groin and scrotum.  Ice packs and reclining will help.  Swelling and bruising can take several days to resolve.  6. It is common to experience some constipation if taking pain medication after surgery.  Increasing fluid intake and taking a stool softener (such as Colace) will usually help or prevent this problem from occurring.  A mild laxative (Milk of Magnesia or Miralax) should be taken according to package directions if there are no bowel movements after 48 hours. 7. Unless discharge instructions indicate otherwise, you may remove your bandages 24-48 hours after surgery, and you may shower at that time.  You may have steri-strips (small skin tapes) in place directly over the incision.  These strips should be left on the skin for 7-10 days.   8. ACTIVITIES:  You may resume regular (light) daily activities beginning the  next day--such as daily self-care, walking, climbing stairs--gradually increasing activities as tolerated.  You may have sexual intercourse when it is comfortable.  Refrain from any heavy lifting or straining until approved by your doctor.  a.You may drive when you are no longer taking prescription pain medication, you can comfortably wear a seatbelt, and you can safely maneuver your car and apply brakes. b.RETURN TO WORK:   _____________________________________________  9.You should see your doctor in the office for a follow-up appointment approximately 2-3 weeks after your surgery.  Make sure that you call for this appointment within a day or two after you arrive home to insure a convenient appointment time. 10.OTHER INSTRUCTIONS: _________________________    _____________________________________  WHEN TO CALL YOUR DOCTOR: Fever over 101.0 Inability to urinate Nausea and/or vomiting Extreme swelling or bruising Continued bleeding from incision. Increased pain, redness, or drainage from the incision  The clinic staff is available to answer your questions during regular business hours.  Please don't hesitate to call and ask to speak to one of the nurses for clinical concerns.  If you have a medical emergency, go to the nearest emergency room or call 911.  A surgeon from [facility] is always on call at the hospital   [address], [address], [place], [place]  [address] ?  [address] [address], [place], [place]   [address] [phone] ? [phone] ? FAX [phone] Web site: [email or web address]

## 2023-12-20 NOTE — Anesthesia Procedure Notes (Signed)
 Anesthesia Regional Block: TAP block   Pre-Anesthetic Checklist: , timeout performed,  Correct Patient, Correct Site, Correct Laterality,  Correct Procedure, Correct Position, site marked,  Risks and benefits discussed,  Surgical consent,  Pre-op evaluation,  At surgeon's request and post-op pain management  Laterality: Left  Prep: chloraprep       Needles:  Injection technique: Single-shot  Needle Type: Echogenic Needle     Needle Length: 9cm  Needle Gauge: 21     Additional Needles:   Procedures:,,,, ultrasound used (permanent image in chart),,    Narrative:  Start time: 12/20/2023 2:59 PM End time: 12/20/2023 3:06 PM Injection made incrementally with aspirations every 5 mL.  Performed by: Personally  Anesthesiologist: Leonce Athens, MD  Additional Notes: Pt identified in Holding room.  Monitors applied. Working IV access confirmed. Timeout, Sterile prep L subcostal abdomen.  #21ga ECHOgenic Arrow block needle into TAP with US  guidance.  25cc 0.5% Bupivacaine  1:200k epi injected incrementally after negative test dose.  Patient asymptomatic, VSS, no heme aspirated, tolerated well.   JAYSON Leonce, MD

## 2023-12-20 NOTE — Anesthesia Procedure Notes (Addendum)
 Procedure Name: Intubation Date/Time: 12/20/2023 3:40 PM  Performed by: Elby Raelene SAUNDERS, CRNAPre-anesthesia Checklist: Patient identified, Emergency Drugs available, Suction available and Patient being monitored Patient Re-evaluated:Patient Re-evaluated prior to induction Oxygen Delivery Method: Circle System Utilized Preoxygenation: Pre-oxygenation with 100% oxygen Induction Type: IV induction Ventilation: Mask ventilation without difficulty Laryngoscope Size: Glidescope and 3 Grade View: Grade II Tube type: Oral Tube size: 7.5 mm Number of attempts: 1 Airway Equipment and Method: Stylet Placement Confirmation: ETT inserted through vocal cords under direct vision, positive ETCO2 and breath sounds checked- equal and bilateral Secured at: 23 cm Tube secured with: Tape Dental Injury: Teeth and Oropharynx as per pre-operative assessment  Difficulty Due To: Difficulty was anticipated, Difficult Airway- due to reduced neck mobility and Difficult Airway- due to limited oral opening Future Recommendations: Recommend- induction with short-acting agent, and alternative techniques readily available

## 2023-12-20 NOTE — Anesthesia Postprocedure Evaluation (Signed)
 Anesthesia Post Note  Patient: James Noble  Procedure(s) Performed: REPAIR, HERNIA, INGUINAL, ADULT (Left)     Patient location during evaluation: PACU Anesthesia Type: General Level of consciousness: awake and alert Pain management: pain level controlled Vital Signs Assessment: post-procedure vital signs reviewed and stable Respiratory status: spontaneous breathing, nonlabored ventilation and respiratory function stable Cardiovascular status: blood pressure returned to baseline and stable Postop Assessment: no apparent nausea or vomiting Anesthetic complications: no   No notable events documented.  Last Vitals:  Vitals:   12/20/23 1703 12/20/23 1718  BP: (!) 120/57 118/80  Pulse: 61 71  Resp: 16 14  Temp: 36.4 C   SpO2: 99% 95%    Last Pain:  Vitals:   12/20/23 1703  TempSrc:   PainSc: Asleep                 Verlin Duke,E. Paizlee Kinder

## 2023-12-21 ENCOUNTER — Encounter (HOSPITAL_COMMUNITY): Payer: Self-pay | Admitting: Surgery

## 2023-12-24 ENCOUNTER — Other Ambulatory Visit (HOSPITAL_COMMUNITY): Payer: Self-pay

## 2024-01-07 DIAGNOSIS — E785 Hyperlipidemia, unspecified: Secondary | ICD-10-CM | POA: Diagnosis not present

## 2024-01-07 DIAGNOSIS — F0390 Unspecified dementia without behavioral disturbance: Secondary | ICD-10-CM | POA: Diagnosis not present

## 2024-01-07 DIAGNOSIS — K219 Gastro-esophageal reflux disease without esophagitis: Secondary | ICD-10-CM | POA: Diagnosis not present

## 2024-01-07 DIAGNOSIS — B372 Candidiasis of skin and nail: Secondary | ICD-10-CM | POA: Diagnosis not present

## 2024-01-09 DIAGNOSIS — J302 Other seasonal allergic rhinitis: Secondary | ICD-10-CM | POA: Diagnosis not present

## 2024-01-09 DIAGNOSIS — R0981 Nasal congestion: Secondary | ICD-10-CM | POA: Diagnosis not present

## 2024-01-14 DIAGNOSIS — G4700 Insomnia, unspecified: Secondary | ICD-10-CM | POA: Diagnosis not present

## 2024-01-14 DIAGNOSIS — F411 Generalized anxiety disorder: Secondary | ICD-10-CM | POA: Diagnosis not present

## 2024-01-14 DIAGNOSIS — F03C18 Unspecified dementia, severe, with other behavioral disturbance: Secondary | ICD-10-CM | POA: Diagnosis not present

## 2024-01-28 DIAGNOSIS — K219 Gastro-esophageal reflux disease without esophagitis: Secondary | ICD-10-CM | POA: Diagnosis not present

## 2024-01-28 DIAGNOSIS — F39 Unspecified mood [affective] disorder: Secondary | ICD-10-CM | POA: Diagnosis not present

## 2024-01-28 DIAGNOSIS — E785 Hyperlipidemia, unspecified: Secondary | ICD-10-CM | POA: Diagnosis not present

## 2024-02-04 DIAGNOSIS — J309 Allergic rhinitis, unspecified: Secondary | ICD-10-CM | POA: Diagnosis not present

## 2024-02-04 DIAGNOSIS — H00019 Hordeolum externum unspecified eye, unspecified eyelid: Secondary | ICD-10-CM | POA: Diagnosis not present

## 2024-02-11 DIAGNOSIS — F411 Generalized anxiety disorder: Secondary | ICD-10-CM | POA: Diagnosis not present

## 2024-02-11 DIAGNOSIS — Z79899 Other long term (current) drug therapy: Secondary | ICD-10-CM | POA: Diagnosis not present

## 2024-02-11 DIAGNOSIS — F03C18 Unspecified dementia, severe, with other behavioral disturbance: Secondary | ICD-10-CM | POA: Diagnosis not present

## 2024-02-11 DIAGNOSIS — G4700 Insomnia, unspecified: Secondary | ICD-10-CM | POA: Diagnosis not present

## 2024-02-12 DIAGNOSIS — L602 Onychogryphosis: Secondary | ICD-10-CM | POA: Diagnosis not present

## 2024-02-12 DIAGNOSIS — L84 Corns and callosities: Secondary | ICD-10-CM | POA: Diagnosis not present

## 2024-02-12 DIAGNOSIS — L603 Nail dystrophy: Secondary | ICD-10-CM | POA: Diagnosis not present

## 2024-02-12 DIAGNOSIS — I739 Peripheral vascular disease, unspecified: Secondary | ICD-10-CM | POA: Diagnosis not present

## 2024-03-05 DIAGNOSIS — K219 Gastro-esophageal reflux disease without esophagitis: Secondary | ICD-10-CM | POA: Diagnosis not present

## 2024-03-05 DIAGNOSIS — E785 Hyperlipidemia, unspecified: Secondary | ICD-10-CM | POA: Diagnosis not present

## 2024-03-05 DIAGNOSIS — F0390 Unspecified dementia without behavioral disturbance: Secondary | ICD-10-CM | POA: Diagnosis not present

## 2024-03-07 DIAGNOSIS — E7800 Pure hypercholesterolemia, unspecified: Secondary | ICD-10-CM | POA: Diagnosis not present

## 2024-03-07 DIAGNOSIS — F0390 Unspecified dementia without behavioral disturbance: Secondary | ICD-10-CM | POA: Diagnosis not present

## 2024-03-07 DIAGNOSIS — K219 Gastro-esophageal reflux disease without esophagitis: Secondary | ICD-10-CM | POA: Diagnosis not present

## 2024-03-10 DIAGNOSIS — F03C18 Unspecified dementia, severe, with other behavioral disturbance: Secondary | ICD-10-CM | POA: Diagnosis not present

## 2024-03-10 DIAGNOSIS — F411 Generalized anxiety disorder: Secondary | ICD-10-CM | POA: Diagnosis not present

## 2024-03-10 DIAGNOSIS — G4700 Insomnia, unspecified: Secondary | ICD-10-CM | POA: Diagnosis not present

## 2024-03-26 DIAGNOSIS — R451 Restlessness and agitation: Secondary | ICD-10-CM | POA: Diagnosis not present

## 2024-03-26 DIAGNOSIS — E785 Hyperlipidemia, unspecified: Secondary | ICD-10-CM | POA: Diagnosis not present

## 2024-03-26 DIAGNOSIS — K219 Gastro-esophageal reflux disease without esophagitis: Secondary | ICD-10-CM | POA: Diagnosis not present

## 2024-04-07 DIAGNOSIS — F03C0 Unspecified dementia, severe, without behavioral disturbance, psychotic disturbance, mood disturbance, and anxiety: Secondary | ICD-10-CM | POA: Diagnosis not present

## 2024-04-07 DIAGNOSIS — F411 Generalized anxiety disorder: Secondary | ICD-10-CM | POA: Diagnosis not present

## 2024-04-07 DIAGNOSIS — G4700 Insomnia, unspecified: Secondary | ICD-10-CM | POA: Diagnosis not present

## 2024-04-15 DIAGNOSIS — L603 Nail dystrophy: Secondary | ICD-10-CM | POA: Diagnosis not present

## 2024-04-15 DIAGNOSIS — L602 Onychogryphosis: Secondary | ICD-10-CM | POA: Diagnosis not present

## 2024-04-15 DIAGNOSIS — L84 Corns and callosities: Secondary | ICD-10-CM | POA: Diagnosis not present

## 2024-04-15 DIAGNOSIS — I739 Peripheral vascular disease, unspecified: Secondary | ICD-10-CM | POA: Diagnosis not present
